# Patient Record
Sex: Female | Born: 1935 | Race: White | Hispanic: No | Smoking: Former smoker
Health system: Southern US, Community
[De-identification: ages and names within clinical notes are randomized; demographics above are authoritative.]

## PROBLEM LIST (undated history)

## (undated) DIAGNOSIS — K219 Gastro-esophageal reflux disease without esophagitis: Secondary | ICD-10-CM

## (undated) DIAGNOSIS — M1990 Unspecified osteoarthritis, unspecified site: Secondary | ICD-10-CM

## (undated) DIAGNOSIS — E119 Type 2 diabetes mellitus without complications: Secondary | ICD-10-CM

## (undated) DIAGNOSIS — Z87442 Personal history of urinary calculi: Secondary | ICD-10-CM

## (undated) DIAGNOSIS — F32A Depression, unspecified: Secondary | ICD-10-CM

## (undated) DIAGNOSIS — J449 Chronic obstructive pulmonary disease, unspecified: Secondary | ICD-10-CM

## (undated) DIAGNOSIS — I38 Endocarditis, valve unspecified: Secondary | ICD-10-CM

## (undated) DIAGNOSIS — Q159 Congenital malformation of eye, unspecified: Secondary | ICD-10-CM

## (undated) DIAGNOSIS — I471 Supraventricular tachycardia, unspecified: Secondary | ICD-10-CM

## (undated) DIAGNOSIS — E785 Hyperlipidemia, unspecified: Secondary | ICD-10-CM

## (undated) DIAGNOSIS — C801 Malignant (primary) neoplasm, unspecified: Secondary | ICD-10-CM

## (undated) DIAGNOSIS — F0390 Unspecified dementia without behavioral disturbance: Secondary | ICD-10-CM

## (undated) DIAGNOSIS — F329 Major depressive disorder, single episode, unspecified: Secondary | ICD-10-CM

## (undated) DIAGNOSIS — I10 Essential (primary) hypertension: Secondary | ICD-10-CM

## (undated) DIAGNOSIS — K649 Unspecified hemorrhoids: Secondary | ICD-10-CM

## (undated) HISTORY — PX: ABDOMINAL HYSTERECTOMY: SHX81

## (undated) HISTORY — PX: EYE SURGERY: SHX253

## (undated) HISTORY — PX: HEMORRHOID SURGERY: SHX153

---
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
## 2004-05-23 ENCOUNTER — Other Ambulatory Visit: Payer: Self-pay

## 2004-05-23 ENCOUNTER — Emergency Department: Payer: Self-pay | Admitting: Emergency Medicine

## 2004-10-04 ENCOUNTER — Ambulatory Visit: Payer: Self-pay | Admitting: Family Medicine

## 2005-07-21 ENCOUNTER — Inpatient Hospital Stay: Payer: Self-pay | Admitting: Internal Medicine

## 2005-07-22 ENCOUNTER — Other Ambulatory Visit: Payer: Self-pay

## 2005-07-23 ENCOUNTER — Other Ambulatory Visit: Payer: Self-pay

## 2005-10-09 ENCOUNTER — Ambulatory Visit: Payer: Self-pay | Admitting: Family Medicine

## 2006-07-02 HISTORY — PX: JOINT REPLACEMENT: SHX530

## 2006-10-07 ENCOUNTER — Other Ambulatory Visit: Payer: Self-pay

## 2006-10-07 ENCOUNTER — Emergency Department: Payer: Self-pay | Admitting: Emergency Medicine

## 2006-10-16 ENCOUNTER — Ambulatory Visit: Payer: Self-pay | Admitting: Family Medicine

## 2007-02-23 ENCOUNTER — Emergency Department: Payer: Self-pay | Admitting: Internal Medicine

## 2007-06-16 ENCOUNTER — Ambulatory Visit: Payer: Self-pay | Admitting: General Practice

## 2007-06-16 ENCOUNTER — Other Ambulatory Visit: Payer: Self-pay

## 2007-06-30 ENCOUNTER — Inpatient Hospital Stay: Payer: Self-pay | Admitting: General Practice

## 2007-10-23 ENCOUNTER — Ambulatory Visit: Payer: Self-pay | Admitting: Family Medicine

## 2008-02-09 ENCOUNTER — Ambulatory Visit: Payer: Self-pay | Admitting: Unknown Physician Specialty

## 2008-07-02 ENCOUNTER — Emergency Department: Payer: Self-pay | Admitting: Emergency Medicine

## 2008-11-08 ENCOUNTER — Ambulatory Visit: Payer: Self-pay | Admitting: Family Medicine

## 2009-05-03 ENCOUNTER — Ambulatory Visit: Payer: Self-pay | Admitting: Family Medicine

## 2009-11-09 ENCOUNTER — Ambulatory Visit: Payer: Self-pay | Admitting: Family Medicine

## 2010-01-07 ENCOUNTER — Emergency Department: Payer: Self-pay | Admitting: Emergency Medicine

## 2011-10-31 ENCOUNTER — Ambulatory Visit: Payer: Self-pay | Admitting: Internal Medicine

## 2012-10-22 ENCOUNTER — Ambulatory Visit: Payer: Self-pay | Admitting: Family Medicine

## 2013-02-12 ENCOUNTER — Emergency Department: Payer: Self-pay | Admitting: Emergency Medicine

## 2013-02-12 LAB — CK TOTAL AND CKMB (NOT AT ARMC)
CK, Total: 90 U/L (ref 21–215)
CK-MB: 0.5 ng/mL (ref 0.5–3.6)

## 2013-02-12 LAB — COMPREHENSIVE METABOLIC PANEL
Albumin: 3.7 g/dL (ref 3.4–5.0)
Anion Gap: 5 — ABNORMAL LOW (ref 7–16)
BUN: 8 mg/dL (ref 7–18)
Bilirubin,Total: 0.4 mg/dL (ref 0.2–1.0)
Chloride: 98 mmol/L (ref 98–107)
Co2: 29 mmol/L (ref 21–32)
Creatinine: 0.94 mg/dL (ref 0.60–1.30)
EGFR (African American): >60
EGFR (Non-African Amer.): 55 — ABNORMAL LOW
Osmolality: 263 (ref 275–301)
Potassium: 4.8 mmol/L (ref 3.5–5.1)
SGPT (ALT): 19 U/L (ref 12–78)
Sodium: 132 mmol/L — ABNORMAL LOW (ref 136–145)
Total Protein: 6.7 g/dL (ref 6.4–8.2)

## 2013-02-12 LAB — URINALYSIS, COMPLETE
Bilirubin,UR: NEGATIVE
Blood: NEGATIVE
Glucose,UR: NEGATIVE mg/dL (ref 0–75)
Protein: NEGATIVE
Squamous Epithelial: 3
WBC UR: 31 /HPF (ref 0–5)

## 2013-02-12 LAB — CBC
HCT: 35.3 % (ref 35.0–47.0)
HGB: 12.3 g/dL (ref 12.0–16.0)
MCHC: 34.9 g/dL (ref 32.0–36.0)
MCV: 89 fL (ref 80–100)
Platelet: 247 10*3/uL (ref 150–440)

## 2013-02-12 LAB — PRO B NATRIURETIC PEPTIDE: B-Type Natriuretic Peptide: 74 pg/mL (ref 0–450)

## 2013-02-12 LAB — TROPONIN I: Troponin-I: <0.02 ng/mL

## 2013-02-14 ENCOUNTER — Emergency Department: Payer: Self-pay | Admitting: Emergency Medicine

## 2013-02-14 LAB — URINALYSIS, COMPLETE
Bilirubin,UR: NEGATIVE
Blood: NEGATIVE
Glucose,UR: NEGATIVE mg/dL (ref 0–75)
Ketone: NEGATIVE
Nitrite: NEGATIVE
Ph: 5 (ref 4.5–8.0)
Protein: NEGATIVE
RBC,UR: 3 /HPF (ref 0–5)
Specific Gravity: 1.005 (ref 1.003–1.030)
Squamous Epithelial: 19
WBC UR: 17 /HPF (ref 0–5)

## 2013-02-14 LAB — URINE CULTURE

## 2013-02-15 LAB — COMPREHENSIVE METABOLIC PANEL
Alkaline Phosphatase: 71 U/L (ref 50–136)
Calcium, Total: 10.5 mg/dL — ABNORMAL HIGH (ref 8.5–10.1)
Co2: 29 mmol/L (ref 21–32)
Creatinine: 1.03 mg/dL (ref 0.60–1.30)
EGFR (African American): 57 — ABNORMAL LOW
EGFR (Non-African Amer.): 50 — ABNORMAL LOW
Glucose: 102 mg/dL — ABNORMAL HIGH (ref 65–99)
Osmolality: 262 (ref 275–301)
SGOT(AST): 24 U/L (ref 15–37)
SGPT (ALT): 19 U/L (ref 12–78)
Sodium: 131 mmol/L — ABNORMAL LOW (ref 136–145)
Total Protein: 7.3 g/dL (ref 6.4–8.2)

## 2013-02-15 LAB — CBC
HGB: 13.2 g/dL (ref 12.0–16.0)
MCHC: 34.5 g/dL (ref 32.0–36.0)
MCV: 90 fL (ref 80–100)
Platelet: 272 10*3/uL (ref 150–440)
RDW: 13.4 % (ref 11.5–14.5)
WBC: 12.1 10*3/uL — ABNORMAL HIGH (ref 3.6–11.0)

## 2013-02-15 LAB — LIPASE, BLOOD: Lipase: 133 U/L (ref 73–393)

## 2013-02-16 LAB — URINE CULTURE

## 2013-02-21 LAB — URINALYSIS, COMPLETE
Bacteria: NONE SEEN
Blood: NEGATIVE
Blood: NEGATIVE
Glucose,UR: NEGATIVE mg/dL (ref 0–75)
Hyaline Cast: 9
Leukocyte Esterase: NEGATIVE
Nitrite: NEGATIVE
Nitrite: NEGATIVE
Protein: 25
RBC,UR: 28 /HPF (ref 0–5)
RBC,UR: 12 /HPF (ref 0–5)
Specific Gravity: 1.02 (ref 1.003–1.030)
Specific Gravity: 1.015 (ref 1.003–1.030)
Squamous Epithelial: NONE SEEN
Transitional Epi: 4
WBC UR: 32 /HPF (ref 0–5)
WBC UR: 1 /HPF (ref 0–5)

## 2013-02-21 LAB — CBC
HCT: 37.8 % (ref 35.0–47.0)
HGB: 13 g/dL (ref 12.0–16.0)
MCHC: 34.5 g/dL (ref 32.0–36.0)
MCV: 89 fL (ref 80–100)
RBC: 4.23 10*6/uL (ref 3.80–5.20)
RDW: 13.5 % (ref 11.5–14.5)
WBC: 6.3 10*3/uL (ref 3.6–11.0)

## 2013-02-21 LAB — COMPREHENSIVE METABOLIC PANEL
Anion Gap: 11 (ref 7–16)
Bilirubin,Total: 0.5 mg/dL (ref 0.2–1.0)
Calcium, Total: 10.3 mg/dL — ABNORMAL HIGH (ref 8.5–10.1)
Chloride: 92 mmol/L — ABNORMAL LOW (ref 98–107)
Creatinine: 1.26 mg/dL (ref 0.60–1.30)
EGFR (African American): 45 — ABNORMAL LOW
EGFR (Non-African Amer.): 39 — ABNORMAL LOW
Glucose: 135 mg/dL — ABNORMAL HIGH (ref 65–99)
SGOT(AST): 21 U/L (ref 15–37)
Sodium: 126 mmol/L — ABNORMAL LOW (ref 136–145)
Total Protein: 7.2 g/dL (ref 6.4–8.2)

## 2013-02-21 LAB — CK TOTAL AND CKMB (NOT AT ARMC)
CK, Total: 36 U/L (ref 21–215)
CK-MB: 1 ng/mL (ref 0.5–3.6)

## 2013-02-22 ENCOUNTER — Inpatient Hospital Stay: Payer: Self-pay | Admitting: Family Medicine

## 2013-02-22 LAB — CK TOTAL AND CKMB (NOT AT ARMC)
CK, Total: 42 U/L (ref 21–215)
CK, Total: 37 U/L (ref 21–215)
CK-MB: 1.5 ng/mL (ref 0.5–3.6)
CK-MB: 1.3 ng/mL (ref 0.5–3.6)

## 2013-02-22 LAB — BASIC METABOLIC PANEL
BUN: 9 mg/dL (ref 7–18)
Chloride: 100 mmol/L (ref 98–107)
Co2: 25 mmol/L (ref 21–32)
Creatinine: 1.05 mg/dL (ref 0.60–1.30)
EGFR (African American): 56 — ABNORMAL LOW
EGFR (Non-African Amer.): 48 — ABNORMAL LOW
Osmolality: 264 (ref 275–301)

## 2013-02-22 LAB — TROPONIN I: Troponin-I: 0.09 ng/mL — ABNORMAL HIGH

## 2013-03-02 ENCOUNTER — Emergency Department: Payer: Self-pay | Admitting: Emergency Medicine

## 2013-03-02 LAB — URINALYSIS, COMPLETE
Bacteria: NONE SEEN
Blood: NEGATIVE
Ph: 5 (ref 4.5–8.0)
RBC,UR: <1 /HPF (ref 0–5)
Squamous Epithelial: NONE SEEN
WBC UR: 1 /HPF (ref 0–5)

## 2013-03-02 LAB — COMPREHENSIVE METABOLIC PANEL
Albumin: 3.5 g/dL (ref 3.4–5.0)
Alkaline Phosphatase: 101 U/L (ref 50–136)
Anion Gap: 5 — ABNORMAL LOW (ref 7–16)
BUN: 16 mg/dL (ref 7–18)
Bilirubin,Total: 0.4 mg/dL (ref 0.2–1.0)
Creatinine: 1.16 mg/dL (ref 0.60–1.30)
EGFR (Non-African Amer.): 43 — ABNORMAL LOW
SGOT(AST): 27 U/L (ref 15–37)
SGPT (ALT): 30 U/L (ref 12–78)
Sodium: 134 mmol/L — ABNORMAL LOW (ref 136–145)
Total Protein: 6.5 g/dL (ref 6.4–8.2)

## 2013-03-02 LAB — CBC
HCT: 38.7 % (ref 35.0–47.0)
HGB: 13.2 g/dL (ref 12.0–16.0)
MCHC: 34 g/dL (ref 32.0–36.0)
MCV: 90 fL (ref 80–100)
Platelet: 450 10*3/uL — ABNORMAL HIGH (ref 150–440)
RBC: 4.3 10*6/uL (ref 3.80–5.20)
WBC: 10.4 10*3/uL (ref 3.6–11.0)

## 2013-03-02 LAB — BASIC METABOLIC PANEL
Anion Gap: 6 — ABNORMAL LOW (ref 7–16)
Calcium, Total: 10.7 mg/dL — ABNORMAL HIGH (ref 8.5–10.1)
EGFR (Non-African Amer.): 40 — ABNORMAL LOW
Glucose: 111 mg/dL — ABNORMAL HIGH (ref 65–99)
Potassium: 4.5 mmol/L (ref 3.5–5.1)

## 2013-03-02 LAB — TROPONIN I: Troponin-I: <0.02 ng/mL

## 2013-03-20 ENCOUNTER — Ambulatory Visit: Payer: Self-pay | Admitting: Family Medicine

## 2013-05-04 ENCOUNTER — Ambulatory Visit: Payer: Self-pay | Admitting: Gastroenterology

## 2013-05-05 LAB — PATHOLOGY REPORT

## 2013-12-13 DIAGNOSIS — I10 Essential (primary) hypertension: Secondary | ICD-10-CM | POA: Diagnosis present

## 2014-03-17 DIAGNOSIS — N189 Chronic kidney disease, unspecified: Secondary | ICD-10-CM | POA: Diagnosis present

## 2014-08-05 ENCOUNTER — Ambulatory Visit: Payer: Self-pay | Admitting: Unknown Physician Specialty

## 2014-10-22 NOTE — Discharge Summary (Signed)
PATIENT NAME:  Erica Donovan, Erica Donovan MR#:  [id number] DATE OF BIRTH:  1925-08-24  DATE OF ADMISSION:  02/22/2013 DATE OF DISCHARGE:  02/22/2013  PRIMARY CARE PHYSICIAN:  Youlanda Roys. Lovie Macadamia, MD  Please refer to H and P dictated by Dr. Margaretmary Eddy.  In general, the patient is an 81 year old female who has history of hypertension, hyperlipidemia, diabetes. She has mitral regurgitation, hyperlipidemia, obesity, and affective disorder, paroxysmal SVT.   She comes to the ER with a history of 10 days of a urinary tract infection. Whenever she came to the ER she was complaining of chest pain and shortness of breath. At that time she was diagnosed with a UTI and sent home.   She is taking Cipro, and she started getting some vomiting and dehydration. This medication was discontinued and changed to Bactrim.   Apparently, the patient started getting a little bit more nauseated. She started to have significant vomiting, not able to keep anything down, decreased her intake per mouth, and started to get dehydrated. On admission the patient was noticed to have a low sodium of 126, and an elevated troponin of 0.08.   The patient was not having any chest pain. Was not having any shortness of breath whatsoever.   She was evaluated. At the evaluation her temperature was 99.7, pulse was 96, respirations 20. Her blood pressure was 141/51. Her lung exam was remarkable. Her urinalysis showed no nitrites or leukocyte esterase, negative for blood, and had 32 white blood cells, but after repeat an I and O catheterization she only had 1 white blood cell;  low suspicion for a urinary tract infection. The patient did not have any further nausea and vomiting when she arrived to the floor. Upon further investigation, the patient has not had a bowel movement in 1-1/2 weeks, for which we started some catharsis.   As to after an enema and recommendations for stool softeners the patient actually felt fine to go home; actually she wanted one to  go home right away as soon as she had a bowel movement. We preferred for her to stay overnight and watch her closely but  the patient stated that she wanted to go home.   We checked on her cardiac enzymes. The first one was slightly elevated at 0.08, which was likely all of the nausea, vomiting and the stress. The second was 0.12, and the third one was  0.09, for which we felt comfortable discharging her, with general  recommendations.   This elevation of troponin was a troponin leak due to all the vomiting and retching that she was doing. Her sodium improved to 132 after IV fluids. The patient was in good condition, afebrile, without any other major issues, for which she was discharged.   I spent about 45 minutes with this discharge.   MEDICATIONS AT DISCHARGE: Lipitor 20 mg at bedtime, aspirin 81 mg daily, Diovan / hydrochlorothiazide 12.5/60 once a day, cyclobenzaprine 5 mg at bedtime, lorazepam 0.5 mg every 8 hours, metformin 500 mg twice daily, multivitamins once a day, Tylenol 500 mg  2 tablets q.6 hours p.r.n. severe pain, Benadryl 25 mg 3 times daily, omeprazole 40 mg twice daily, Zofran as needed for nausea. Bactrim 800/160, 1 to 2 times a day; continue Rx until gone.     ____________________________ Inverness Sink, MD rsg:dm D: 02/23/2013 07:07:54 ET T: 02/23/2013 08:16:50 ET JOB#: [id number]  cc: Reubens Sink, MD, <Dictator> Youlanda Roys. Lovie Macadamia, MD Roselie Awkward America Brown MD ELECTRONICALLY SIGNED 02/27/2013 12:48

## 2014-10-22 NOTE — H&P (Signed)
PATIENT NAME:  Erica Donovan, Erica Donovan MR#:  [id number] DATE OF BIRTH:  September 26, 1925  DATE OF ADMISSION:  02/22/2013  PRIMARY CARE PHYSICIAN:  Dr. Juluis Pitch.  REFERRING PHYSICIAN:  Dr. Reita Cliche.  CHIEF COMPLAINT:  Intractable nausea, vomiting.   HISTORY OF PRESENT ILLNESS:  The patient is an 81 year old Caucasian female who was seen by the ER physician approximately 10 days ago when she came with chest pain and shortness of breath, but diagnosed with a urinary tract infection and was sent home with ciprofloxacin.  Following taking ciprofloxacin, the patient started developing nausea and vomiting and she was dehydrated.  She was seen by the ER physician again on Saturday, approximately one week ago, and ciprofloxacin was discontinued as the patient could not tolerate that and she was started on Bactrim and she was discharged after providing IV fluids for dehydration.  The patient's situation clinically is still declining and still nauseated and vomiting and could not keep anything down.  She is coming into the ER again today for nausea, vomiting, decreased by mouth intake and weakness.  The patient's initial urinalysis has revealed 3+ leukocyte esterase, but repeat urinalysis in two hours has revealed negative leukocyte esterase and nitrate.  Her sodium is low at 126 and troponin is slightly elevated at 0.08.  The patient denies any chest pain or shortness of breath.  Denies any abdominal pain, dizziness or loss of consciousness.  No similar complaints in the past.   PAST MEDICAL HISTORY:  Hypertension, hyperlipidemia, diabetes mellitus, history of mitral regurgitation and tricuspid regurgitation, hyperlipidemia, obesity, GERD, affective disorder, paroxysmal SVT.   PAST SURGICAL HISTORY:  Total knee replacement, left cataract surgery, hysterectomy, hemorrhoidectomy, appendectomy, kidney stones.   PSYCHOSOCIAL HISTORY:  Lives alone.  Quit smoking 30 years ago.  Denies alcohol or illicit drug usage.   FAMILY  HISTORY:  Father, diabetes and MI, died at age 58.  Mother with complication of diabetes.   HOME MEDICATIONS:  Zofran 1 tablet by mouth q. 8 hours, Tylenol 500 mg 2 tablets every six hours, omeprazole 40 mg 2 times a day, multivitamin 1 tablet once daily, metformin 500 mg 2 times a day, Lipitor 20 mg once daily, Benadryl 25 mg 3 times a day, Bactrim 1 tablet by mouth 2 times a day, aspirin 81 mg once daily.   REVIEW OF SYSTEMS: CONSTITUTIONAL:  Denies any fever, fatigue.  EYES:  Denies blurry vision, glaucoma.  EARS, NOSE, THROAT:  No epistaxis or discharge.   RESPIRATION:  No cough, COPD.  CARDIOVASCULAR:  Denies any chest pain or palpitations.  GASTROINTESTINAL:  Intractable nausea and vomiting.  Denies diarrhea.  Denies any abdominal pain.  GENITOURINARY:  No dysuria, hematuria.  GYNECOLOGIC AND BREAST:  Denies any breast mass, vaginal discharge, status post hysterectomy.  ENDOCRINE:  No polyuria, nocturia.  Denies any thyroid problems.  HEMATOLOGIC AND LYMPHATIC:  No anemia, easy bruising.  MUSCULOSKELETAL:  No joint pain in the neck, back.  Denies gout. NEUROLOGIC:  No vertigo or ataxia.  PSYCHIATRIC:  No ADD, OCD.   PHYSICAL EXAMINATION: VITAL SIGNS:  Temperature 99.7, pulse is 96, respirations 20, blood pressure 141/51, pulse ox 97%.  GENERAL APPEARANCE:  Not under acute distress.  Moderately built and nourished.  HEENT:  Normocephalic, atraumatic.  Pupils are equal, reactive to light and accommodation.  No sinus tenderness.  No scleral icterus.  No conjunctival injection.  No postnasal drip.  Dry mucous membranes.  NECK:  Supple.  No JVD.  No thyromegaly.  No lymphadenopathy.  LUNGS:  Clear to auscultation bilaterally.  No accessory muscle usage.  No anterior chest wall tenderness on palpation.  CARDIAC:  S1, S2 normal.  Positive murmur.  GASTROINTESTINAL:  Soft, obese.  Bowel sounds are positive in all four quadrants.  Nontender, nondistended.  No hepatosplenomegaly.   NEUROLOGIC:   Awake, alert, oriented x 3.  Motor and sensory are grossly intact.  Reflexes are 2+.  EXTREMITIES:  No edema.  No cyanosis.  No clubbing.  PSYCHIATRIC:  Normal mood and affect.  SKIN:  Warm to touch.  Normal turgor.  Dry in nature.  No rashes.  No lesions.  MUSCULOSKELETAL:  No joint effusion, tenderness, erythema.   LABORATORY DATA:  Initial urinalysis done at 20 hours 54 minutes has revealed yellow color, clear in appearance with nitrites negative, leukocyte esterase 3+.  Repeat urinalysis which was done on 23rd of August at 22 hours 53 minutes has revealed negative blood, no nitrite or leukocyte esterase.  CBC is normal.  White count is also normal.  CK total 36, CPK-MB 1.0, troponin 0.08.  LFTs are normal.  Glucose 135, BUN 10, creatinine 1.06, sodium 126, potassium 4.1, chloride 92, CO2 23.  GFR 39.  Calcium 10.3.  A 12 lead EKG, normal sinus rhythm, nonspecific ST-T wave changes.   ASSESSMENT AND PLAN:  An 81 year old Caucasian female presenting to the ER with a chief complaint of intractable nausea, vomiting following taking antibiotics for urinary tract infection, will be admitted with the following assessment and plan. 1.  Intractable nausea and vomiting.  We will keep her nothing by mouth.  We will provide her IV fluids and proton pump inhibitor and Zofran will be provided for nausea and vomiting.  2.  Acute cystitis.  We will give her IV levofloxacin.  3.  Elevated troponin, could be from demand ischemia.  We will cycle cardiac biomarkers and put her on acute coronary syndrome protocol.  We will provide aspirin via rectum.  If necessary, we will consult cardiology, Dr. Nehemiah Massed who is her cardiologist.  4.  Hyponatremia, probably from dehydration.  We will provide IV fluids.  5.  Diabetes mellitus.  The patient will be on sliding scale insulin.  6.  Hypertension.  Blood pressure is stable.   7.  We will provide GI and DVT prophylaxis.  8.  CODE STATUS:  SHE IS FULL CODE.  Daughter is her  medical power of attorney.    Diagnosis and plan of care was discussed in detail with the patient and her daughter at bedside.  They both verbalized understanding of the plan.   Total time spent on the admission is 45 minutes.    ____________________________ Nicholes Mango, MD ag:ea D: 02/22/2013 00:36:50 ET T: 02/22/2013 02:08:27 ET JOB#: [id number]  cc: Nicholes Mango, MD, <Dictator> Nicholes Mango MD ELECTRONICALLY SIGNED 02/24/2013 6:59

## 2015-03-18 DIAGNOSIS — E119 Type 2 diabetes mellitus without complications: Secondary | ICD-10-CM

## 2015-08-06 DIAGNOSIS — F419 Anxiety disorder, unspecified: Secondary | ICD-10-CM | POA: Insufficient documentation

## 2015-08-06 DIAGNOSIS — K219 Gastro-esophageal reflux disease without esophagitis: Secondary | ICD-10-CM | POA: Insufficient documentation

## 2015-09-29 ENCOUNTER — Ambulatory Visit
Admission: RE | Admit: 2015-09-29 | Discharge: 2015-09-29 | Disposition: A | Discharge status: Home / Self Care | Payer: Medicare Other | Source: Ambulatory Visit | Attending: Unknown Physician Specialty | Admitting: Unknown Physician Specialty

## 2015-09-29 ENCOUNTER — Other Ambulatory Visit: Payer: Self-pay | Admitting: Unknown Physician Specialty

## 2015-09-29 DIAGNOSIS — R059 Cough, unspecified: Secondary | ICD-10-CM

## 2015-09-29 DIAGNOSIS — R05 Cough: Secondary | ICD-10-CM | POA: Insufficient documentation

## 2017-02-14 ENCOUNTER — Other Ambulatory Visit: Payer: Self-pay | Admitting: Family Medicine

## 2017-02-14 DIAGNOSIS — R52 Pain, unspecified: Secondary | ICD-10-CM

## 2017-02-14 DIAGNOSIS — R102 Pelvic and perineal pain: Secondary | ICD-10-CM

## 2017-02-14 DIAGNOSIS — R229 Localized swelling, mass and lump, unspecified: Secondary | ICD-10-CM

## 2017-02-21 ENCOUNTER — Ambulatory Visit
Admission: RE | Admit: 2017-02-21 | Discharge: 2017-02-21 | Disposition: A | Discharge status: Home / Self Care | Payer: Medicare Other | Source: Ambulatory Visit | Attending: Family Medicine | Admitting: Family Medicine

## 2017-02-21 ENCOUNTER — Other Ambulatory Visit: Payer: Self-pay | Admitting: Family Medicine

## 2017-02-21 DIAGNOSIS — R229 Localized swelling, mass and lump, unspecified: Secondary | ICD-10-CM

## 2017-02-21 DIAGNOSIS — K5730 Diverticulosis of large intestine without perforation or abscess without bleeding: Secondary | ICD-10-CM | POA: Insufficient documentation

## 2017-02-21 DIAGNOSIS — R102 Pelvic and perineal pain: Secondary | ICD-10-CM

## 2017-02-21 HISTORY — DX: Type 2 diabetes mellitus without complications: E11.9

## 2017-02-21 LAB — POCT I-STAT CREATININE: Creatinine, Ser: 1.9 mg/dL — ABNORMAL HIGH (ref 0.44–1.00)

## 2017-02-21 MED ORDER — IOPAMIDOL (ISOVUE-300) INJECTION 61%: 100.0000 mL | Freq: Once | INTRAVENOUS | Status: DC | PRN

## 2017-10-21 ENCOUNTER — Other Ambulatory Visit: Payer: Self-pay | Admitting: Specialist

## 2017-10-21 DIAGNOSIS — R1310 Dysphagia, unspecified: Secondary | ICD-10-CM

## 2017-11-13 ENCOUNTER — Ambulatory Visit
Admission: RE | Admit: 2017-11-13 | Discharge: 2017-11-13 | Disposition: A | Discharge status: Home / Self Care | Payer: Medicare Other | Source: Ambulatory Visit | Attending: Specialist | Admitting: Specialist

## 2017-11-13 DIAGNOSIS — R1312 Dysphagia, oropharyngeal phase: Secondary | ICD-10-CM

## 2017-11-13 DIAGNOSIS — R1310 Dysphagia, unspecified: Secondary | ICD-10-CM | POA: Insufficient documentation

## 2017-11-13 NOTE — Therapy (Signed)
[place], [place], [address] Phone: [phone]   Fax:     Modified Barium Swallow  Patient Details  Name: YULA CROTWELL MRN: [id number] Date of Birth: [birth year] No data recorded  Encounter Date: 11/13/2017  End of Session - 11/13/17 1337    Visit Number  1    Number of Visits  1    Date for SLP Re-Evaluation  11/13/17    SLP Start Time  13    SLP Stop Time   1330    SLP Time Calculation (min)  60 min       Past Medical History:  Diagnosis Date  . Diabetes mellitus without complication ([place])       There were no vitals filed for this visit.   Subjective: Patient behavior: (alertness, ability to follow instructions, etc.): The patient is able to verbalize her swallowing history and follow directions.  Chief complaint: chronic cough, concern for micro-aspiration   Objective:  Radiological Procedure: A videoflouroscopic evaluation of oral-preparatory, reflex initiation, and pharyngeal phases of the swallow was performed; as well as a screening of the upper esophageal phase.  I. POSTURE: Upright in MBS chair  II. VIEW: Lateral  III. COMPENSATORY STRATEGIES: N/A  IV. BOLUSES ADMINISTERED:   Thin Liquid: 1 small, 4 rapid consecutive   Nectar-thick Liquid: 1 moderate   Honey-thick Liquid: DNT   Puree: 2 teaspoon presentations   Mechanical Soft: 1/4 graham cracker in applesauce  V. RESULTS OF EVALUATION: A. ORAL PREPARATORY PHASE: (The lips, tongue, and velum are observed for strength and coordination)       **Overall Severity Rating:  Within normal limits; trace/coating residue consistent with adhering to dry mucus membranes  B. SWALLOW INITIATION/REFLEX: (The reflex is normal if "triggered" by the time the bolus reached the base of the tongue)  **Overall Severity Rating: Mild; triggers while falling from the valleculae to the pyriform sinuses  C. PHARYNGEAL PHASE:  (Pharyngeal function is normal if the bolus shows rapid, smooth, and continuous transit through the pharynx and there is no pharyngeal residue after the swallow)  **Overall Severity Rating: Within normal limits;  trace/coating residue consistent with adhering to dry mucus membranes  D. LARYNGEAL PENETRATION: (Material entering into the laryngeal inlet/vestibule but not aspirated) None  E. ASPIRATION: None  F. ESOPHAGEAL PHASE: (Screening of the upper esophagus): In the cervical esophagus there is a finger-like protrusion along the posterior wall during swallow (does not impede flow of boluses) consistent with prominent cricopharyngeus.    ASSESSMENT: This 84 year old woman; with chronic cough and concern for micro-aspiration; is presenting with minimal oropharyngeal dysphagia characterized by trace/coating oropharyngeal residue and delayed pharyngeal swallow initiation.  Oral control of the bolus including oral hold, rotary mastication, and anterior to posterior transfer is within normal limits. Aspects of the pharyngeal stage of swallowing including tongue base retraction, hyolaryngeal excursion, epiglottic inversion, and duration/amplitude of UES opening are within normal limits.  There is no observed laryngeal penetration or tracheal aspiration.  In the cervical esophagus there is a finger-like protrusion along the posterior wall during swallow (does not impede flow of boluses) consistent with prominent cricopharyngeus.    PLAN/RECOMMENDATIONS:   A. Diet: Regular   B. Swallowing Precautions: No special precautions required   C. Recommended consultation to: follow up with MDs as recommended   D. Therapy recommendations: speech therapy is not indicated   E. Results and recommendations were discussed with the patient immediately following the study  and the final report routed to tthe referring MD  :   Oropharyngeal dysphagia - Plan: DG OP Swallowing Func-Medicare/Speech Path, DG OP Swallowing  Func-Medicare/Speech Path        Problem List There are no active problems to display for this patient.  Leroy Sea, MS/CCC- SLP  Lou Miner 11/13/2017, 1:38 PM  [facility] [place], [place], [address] Phone: [phone]   Fax:     Name: APHRODITE HARPENAU MRN: [id number] Date of Birth: 1925/09/14

## 2018-03-15 ENCOUNTER — Other Ambulatory Visit: Payer: Self-pay

## 2018-03-15 ENCOUNTER — Emergency Department: Payer: Medicare Other

## 2018-03-15 DIAGNOSIS — Y999 Unspecified external cause status: Secondary | ICD-10-CM | POA: Insufficient documentation

## 2018-03-15 DIAGNOSIS — Z7982 Long term (current) use of aspirin: Secondary | ICD-10-CM | POA: Diagnosis not present

## 2018-03-15 DIAGNOSIS — E119 Type 2 diabetes mellitus without complications: Secondary | ICD-10-CM | POA: Diagnosis not present

## 2018-03-15 DIAGNOSIS — Y9389 Activity, other specified: Secondary | ICD-10-CM | POA: Diagnosis not present

## 2018-03-15 DIAGNOSIS — S0990XA Unspecified injury of head, initial encounter: Secondary | ICD-10-CM | POA: Insufficient documentation

## 2018-03-15 DIAGNOSIS — Y92009 Unspecified place in unspecified non-institutional (private) residence as the place of occurrence of the external cause: Secondary | ICD-10-CM | POA: Diagnosis not present

## 2018-03-15 DIAGNOSIS — W0110XA Fall on same level from slipping, tripping and stumbling with subsequent striking against unspecified object, initial encounter: Secondary | ICD-10-CM | POA: Diagnosis not present

## 2018-03-15 NOTE — ED Triage Notes (Signed)
Reports closing blinds at home and table tipped and she fell and hit her head.  Denies loss of consciousness, takes baby aspirin.  Patient complains of head pain and neck pain.

## 2018-03-16 ENCOUNTER — Emergency Department
Admission: EM | Admit: 2018-03-16 | Discharge: 2018-03-16 | Disposition: A | Discharge status: Home / Self Care | Payer: Medicare Other | Attending: Emergency Medicine | Admitting: Emergency Medicine

## 2018-03-16 DIAGNOSIS — Y92009 Unspecified place in unspecified non-institutional (private) residence as the place of occurrence of the external cause: Secondary | ICD-10-CM

## 2018-03-16 DIAGNOSIS — S0990XA Unspecified injury of head, initial encounter: Secondary | ICD-10-CM

## 2018-03-16 DIAGNOSIS — W19XXXA Unspecified fall, initial encounter: Secondary | ICD-10-CM

## 2018-03-16 NOTE — ED Provider Notes (Signed)
[facility] Emergency Department Provider Note  ____________________________________________   First MD Initiated Contact with Patient 03/16/18 0209     (approximate)  I have reviewed the triage vital signs and the nursing notes.   HISTORY  Chief Complaint Fall and Head Injury   HPI Erica Donovan is a 84 y.o. female who comes to the emergency department after a mechanical trip and fall striking the front of her head.  She is attempting to close the blinds at her house when she slipped forward and struck her forehead.  She takes a baby aspirin a day which concerned her so she came to the emergency department for further evaluation.  Denies neck pain.  Denies loss of consciousness.  Denies double vision or blurred vision.  She had sudden onset mild to moderate headache that is slowly improving with time.  No antecedent chest pain or palpitations.    Past Medical History:  Diagnosis Date  . Diabetes mellitus without complication ([place])     There are no active problems to display for this patient.     Prior to Admission medications   Not on File    Allergies Ciprofloxacin and Penicillins  No family history on file.  Social History Social History   Tobacco Use  . Smoking status: Not on file  Substance Use Topics  . Alcohol use: Not on file  . Drug use: Not on file    Review of Systems Constitutional: No fever/chills Eyes: No visual changes. ENT: No sore throat. Cardiovascular: Denies chest pain. Respiratory: Denies shortness of breath. Gastrointestinal: No abdominal pain.  No nausea, no vomiting.   Genitourinary: Negative for dysuria. Musculoskeletal: Negative for back pain. Skin: Positive for ecchymosis Neurological: Positive for headache  ____________________________________________   PHYSICAL EXAM:  VITAL SIGNS: ED Triage Vitals  Enc Vitals Group     BP 03/15/18 2319 (!) 150/58     Pulse Rate 03/15/18 2319 79     Resp  03/15/18 2319 20     Temp 03/15/18 2319 98 F (36.7 C)     Temp Source 03/15/18 2319 Oral     SpO2 03/15/18 2319 97 %     Weight 03/15/18 2313 164 lb (74.4 kg)     Height 03/15/18 2313 5\' 5"  (1.651 m)     Head Circumference --      Peak Flow --      Pain Score 03/15/18 2320 3     Pain Loc --      Pain Edu? --      Excl. in [place]? --     Constitutional: Alert and oriented x4 pleasant cooperative speaks in full clear sentences no diaphoresis Eyes: PERRL EOMI. midrange and brisk Head: Atraumatic.  Ecchymosis to forehead Nose: No congestion/rhinnorhea. Mouth/Throat: No trismus Neck: No stridor.   Cardiovascular: Normal rate, regular rhythm. Grossly normal heart sounds.  Good peripheral circulation. Respiratory: Normal respiratory effort.  No retractions. Lungs CTAB and moving good air Gastrointestinal: Soft nontender Musculoskeletal: No lower extremity edema   Neurologic:  Normal speech and language. No gross focal neurologic deficits are appreciated. Skin:  Skin is warm, dry and intact. No rash noted. Psychiatric: Mood and affect are normal. Speech and behavior are normal.    ____________________________________________   DIFFERENTIAL includes but not limited to  Mechanical fall, syncope, intracerebral hemorrhage, cervical spine fracture ____________________________________________   LABS (all labs ordered are listed, but only abnormal results are displayed)  Labs Reviewed - No data to display  __________________________________________  EKG   ____________________________________________  RADIOLOGY  CT of the head neck reviewed by me with no acute disease ____________________________________________   PROCEDURES  Procedure(s) performed: no  Procedures  Critical Care performed: no  ____________________________________________   INITIAL IMPRESSION / ASSESSMENT AND PLAN / ED COURSE  Pertinent labs & imaging results that were available during my care of  the patient were reviewed by me and considered in my medical decision making (see chart for details).   As part of my medical decision making, I reviewed the following data within the [place] History obtained from family if available, nursing notes, old chart and ekg, as well as notes from prior ED visits.  The patient's pain is controlled head and neck CTs are unremarkable and she is neuro intact.  Family at bedside.  She is discharged home in stable condition.      ____________________________________________   FINAL CLINICAL IMPRESSION(S) / ED DIAGNOSES  Final diagnoses:  Fall in home, initial encounter  Injury of head, initial encounter      NEW MEDICATIONS STARTED DURING THIS VISIT:  There are no discharge medications for this patient.    Note:  This document was prepared using Dragon voice recognition software and may include unintentional dictation errors.     Darel Hong, MD 03/16/18 [phone]

## 2018-03-16 NOTE — Discharge Instructions (Signed)
It was a pleasure to take care of you today, and thank you for coming to our emergency department.  If you have any questions or concerns before leaving please ask the nurse to grab me and I'm more than happy to go through your aftercare instructions again.  If you were prescribed any opioid pain medication today such as Norco, Vicodin, Percocet, morphine, hydrocodone, or oxycodone please make sure you do not drive when you are taking this medication as it can alter your ability to drive safely.  If you have any concerns once you are home that you are not improving or are in fact getting worse before you can make it to your follow-up appointment, please do not hesitate to call 911 and come back for further evaluation.  Darel Hong, MD  Results for orders placed or performed during the hospital encounter of 02/21/17  I-STAT creatinine  Result Value Ref Range   Creatinine, Ser 1.90 (H) 0.44 - 1.00 mg/dL   Ct Head Wo Contrast  Result Date: 03/16/2018 CLINICAL DATA:  [place] closing blinds, struck head. No loss of consciousness. Headache and neck pain. History of diabetes. EXAM: CT HEAD WITHOUT CONTRAST CT CERVICAL SPINE WITHOUT CONTRAST TECHNIQUE: Multidetector CT imaging of the head and cervical spine was performed following the standard protocol without intravenous contrast. Multiplanar CT image reconstructions of the cervical spine were also generated. COMPARISON:  MRI head October 22, 2012 and MRI cervical spine Oct 31, 2011 FINDINGS: CT HEAD FINDINGS BRAIN: No intraparenchymal hemorrhage, mass effect nor midline shift. The ventricles and sulci are normal for age. Patchy supratentorial white matter hypodensities less than expected for patient's age, though non-specific are most compatible with chronic small vessel ischemic disease. No acute large vascular territory infarcts. No abnormal extra-axial fluid collections. Basal cisterns are patent. VASCULAR: Moderate calcific atherosclerosis of the carotid  siphons. SKULL: No skull fracture. Bilateral frontoparietal calvarial thinning is a normal variant. Small RIGHT frontal scalp hematoma without subcutaneous gas or radiopaque foreign bodies. SINUSES/ORBITS: Trace paranasal sinus mucosal thickening. Mastoid air cells are well aerated.The included ocular globes and orbital contents are non-suspicious. OTHER: Patient is edentulous. CT CERVICAL SPINE FINDINGS ALIGNMENT: Straightened cervical lordosis. Minimal grade 1 C2-3 anterolisthesis. SKULL BASE AND VERTEBRAE: Cervical vertebral bodies and posterior elements are intact. 4 mm atlantodental interval. Severe C3-4 through C5-6 disc height loss with endplate spurring compatible with degenerative discs, moderate at C2-3. Moderate upper cervical facet arthropathy. SOFT TISSUES AND SPINAL CANAL: Nonacute. DISC LEVELS: No significant osseous canal stenosis. Bilateral C3-4, LEFT C4-5, bilateral C5-6 neural foraminal narrowing. UPPER CHEST: Lung apices are clear. OTHER: None. IMPRESSION: CT HEAD: 1. No acute intracranial process. Small RIGHT frontal scalp hematoma. 2. Otherwise negative non-contrast CT HEAD for age. CT CERVICAL SPINE: 1. No acute fracture. 2. Minimal grade 1 C2-3 anterolisthesis. Mildly widened atlantodental interval, likely degenerative. 3. Severe C3-4 through C5-6 neural foraminal narrowing. Electronically Signed   By: Elon Alas M.D.   On: 03/16/2018 00:14   Ct Cervical Spine Wo Contrast  Result Date: 03/16/2018 CLINICAL DATA:  [place] closing blinds, struck head. No loss of consciousness. Headache and neck pain. History of diabetes. EXAM: CT HEAD WITHOUT CONTRAST CT CERVICAL SPINE WITHOUT CONTRAST TECHNIQUE: Multidetector CT imaging of the head and cervical spine was performed following the standard protocol without intravenous contrast. Multiplanar CT image reconstructions of the cervical spine were also generated. COMPARISON:  MRI head October 22, 2012 and MRI cervical spine Oct 31, 2011 FINDINGS: CT  HEAD FINDINGS BRAIN: No intraparenchymal  hemorrhage, mass effect nor midline shift. The ventricles and sulci are normal for age. Patchy supratentorial white matter hypodensities less than expected for patient's age, though non-specific are most compatible with chronic small vessel ischemic disease. No acute large vascular territory infarcts. No abnormal extra-axial fluid collections. Basal cisterns are patent. VASCULAR: Moderate calcific atherosclerosis of the carotid siphons. SKULL: No skull fracture. Bilateral frontoparietal calvarial thinning is a normal variant. Small RIGHT frontal scalp hematoma without subcutaneous gas or radiopaque foreign bodies. SINUSES/ORBITS: Trace paranasal sinus mucosal thickening. Mastoid air cells are well aerated.The included ocular globes and orbital contents are non-suspicious. OTHER: Patient is edentulous. CT CERVICAL SPINE FINDINGS ALIGNMENT: Straightened cervical lordosis. Minimal grade 1 C2-3 anterolisthesis. SKULL BASE AND VERTEBRAE: Cervical vertebral bodies and posterior elements are intact. 4 mm atlantodental interval. Severe C3-4 through C5-6 disc height loss with endplate spurring compatible with degenerative discs, moderate at C2-3. Moderate upper cervical facet arthropathy. SOFT TISSUES AND SPINAL CANAL: Nonacute. DISC LEVELS: No significant osseous canal stenosis. Bilateral C3-4, LEFT C4-5, bilateral C5-6 neural foraminal narrowing. UPPER CHEST: Lung apices are clear. OTHER: None. IMPRESSION: CT HEAD: 1. No acute intracranial process. Small RIGHT frontal scalp hematoma. 2. Otherwise negative non-contrast CT HEAD for age. CT CERVICAL SPINE: 1. No acute fracture. 2. Minimal grade 1 C2-3 anterolisthesis. Mildly widened atlantodental interval, likely degenerative. 3. Severe C3-4 through C5-6 neural foraminal narrowing. Electronically Signed   By: Elon Alas M.D.   On: 03/16/2018 00:14

## 2018-10-29 ENCOUNTER — Other Ambulatory Visit: Payer: Self-pay | Admitting: Orthopedic Surgery

## 2018-10-29 ENCOUNTER — Ambulatory Visit
Admission: RE | Admit: 2018-10-29 | Discharge: 2018-10-29 | Disposition: A | Discharge status: Home / Self Care | Payer: Medicare Other | Source: Ambulatory Visit | Attending: Orthopedic Surgery | Admitting: Orthopedic Surgery

## 2018-10-29 ENCOUNTER — Other Ambulatory Visit (HOSPITAL_COMMUNITY): Payer: Self-pay | Admitting: Orthopedic Surgery

## 2018-10-29 ENCOUNTER — Other Ambulatory Visit: Payer: Self-pay

## 2018-10-29 DIAGNOSIS — S52611D Displaced fracture of right ulna styloid process, subsequent encounter for closed fracture with routine healing: Secondary | ICD-10-CM

## 2018-11-05 ENCOUNTER — Encounter
Admission: RE | Admit: 2018-11-05 | Discharge: 2018-11-05 | Disposition: A | Discharge status: Home / Self Care | Payer: Medicare Other | Source: Ambulatory Visit | Attending: Orthopedic Surgery | Admitting: Orthopedic Surgery

## 2018-11-05 ENCOUNTER — Other Ambulatory Visit: Payer: Self-pay

## 2018-11-05 DIAGNOSIS — Z96652 Presence of left artificial knee joint: Secondary | ICD-10-CM | POA: Diagnosis not present

## 2018-11-05 DIAGNOSIS — X58XXXA Exposure to other specified factors, initial encounter: Secondary | ICD-10-CM | POA: Diagnosis not present

## 2018-11-05 DIAGNOSIS — G4730 Sleep apnea, unspecified: Secondary | ICD-10-CM | POA: Diagnosis not present

## 2018-11-05 DIAGNOSIS — F0390 Unspecified dementia without behavioral disturbance: Secondary | ICD-10-CM | POA: Diagnosis not present

## 2018-11-05 DIAGNOSIS — S52501A Unspecified fracture of the lower end of right radius, initial encounter for closed fracture: Secondary | ICD-10-CM | POA: Diagnosis not present

## 2018-11-05 DIAGNOSIS — N189 Chronic kidney disease, unspecified: Secondary | ICD-10-CM | POA: Diagnosis not present

## 2018-11-05 DIAGNOSIS — S52511A Displaced fracture of right radial styloid process, initial encounter for closed fracture: Secondary | ICD-10-CM | POA: Diagnosis present

## 2018-11-05 DIAGNOSIS — Z87891 Personal history of nicotine dependence: Secondary | ICD-10-CM | POA: Diagnosis not present

## 2018-11-05 DIAGNOSIS — J449 Chronic obstructive pulmonary disease, unspecified: Secondary | ICD-10-CM | POA: Diagnosis not present

## 2018-11-05 DIAGNOSIS — Z79899 Other long term (current) drug therapy: Secondary | ICD-10-CM | POA: Diagnosis not present

## 2018-11-05 DIAGNOSIS — I129 Hypertensive chronic kidney disease with stage 1 through stage 4 chronic kidney disease, or unspecified chronic kidney disease: Secondary | ICD-10-CM | POA: Diagnosis not present

## 2018-11-05 DIAGNOSIS — E1122 Type 2 diabetes mellitus with diabetic chronic kidney disease: Secondary | ICD-10-CM | POA: Diagnosis not present

## 2018-11-05 DIAGNOSIS — Z85828 Personal history of other malignant neoplasm of skin: Secondary | ICD-10-CM | POA: Diagnosis not present

## 2018-11-05 DIAGNOSIS — F329 Major depressive disorder, single episode, unspecified: Secondary | ICD-10-CM | POA: Diagnosis not present

## 2018-11-05 DIAGNOSIS — I10 Essential (primary) hypertension: Secondary | ICD-10-CM | POA: Insufficient documentation

## 2018-11-05 DIAGNOSIS — Z01818 Encounter for other preprocedural examination: Secondary | ICD-10-CM | POA: Insufficient documentation

## 2018-11-05 HISTORY — DX: Malignant (primary) neoplasm, unspecified: C80.1

## 2018-11-05 HISTORY — DX: Essential (primary) hypertension: I10

## 2018-11-05 HISTORY — DX: Chronic obstructive pulmonary disease, unspecified: J44.9

## 2018-11-05 HISTORY — DX: Congenital malformation of eye, unspecified: Q15.9

## 2018-11-05 HISTORY — DX: Endocarditis, valve unspecified: I38

## 2018-11-05 HISTORY — DX: Supraventricular tachycardia, unspecified: I47.10

## 2018-11-05 HISTORY — DX: Unspecified osteoarthritis, unspecified site: M19.90

## 2018-11-05 HISTORY — DX: Unspecified dementia, unspecified severity, without behavioral disturbance, psychotic disturbance, mood disturbance, and anxiety: F03.90

## 2018-11-05 HISTORY — DX: Depression, unspecified: F32.A

## 2018-11-05 HISTORY — DX: Supraventricular tachycardia: I47.1

## 2018-11-05 HISTORY — DX: Gastro-esophageal reflux disease without esophagitis: K21.9

## 2018-11-05 HISTORY — DX: Unspecified hemorrhoids: K64.9

## 2018-11-05 HISTORY — DX: Major depressive disorder, single episode, unspecified: F32.9

## 2018-11-05 HISTORY — DX: Personal history of urinary calculi: Z87.442

## 2018-11-05 HISTORY — DX: Hyperlipidemia, unspecified: E78.5

## 2018-11-05 LAB — POTASSIUM: Potassium: 4.1 mmol/L (ref 3.5–5.1)

## 2018-11-05 NOTE — Patient Instructions (Addendum)
Your procedure is scheduled on: 11-05-18 THURSDAY Report to Same Day Surgery 2nd floor medical mall [facility] Entrance-take elevator on left to 2nd floor.  Check in with surgery information desk.) To find out your arrival time please call [phone] between 1PM - 3PM on 11-04-18 [facility]  Remember: Instructions that are not followed completely may result in serious medical risk, up to and including death, or upon the discretion of your surgeon and anesthesiologist your surgery may need to be rescheduled.    _x___ 1. Do not eat food after midnight the night before your procedure. NO GUM OR CANDY AFTER MIDNIGHT. You may drink clear liquids up to 2 hours before you are scheduled to arrive at the hospital for your procedure.  Do not drink clear liquids within 2 hours of your scheduled arrival to the hospital.  Clear liquids include  --Water or Apple juice without pulp  --Clear carbohydrate beverage such as ClearFast or Gatorade  --Black Coffee or Clear Tea (No milk, no creamers, do not add anything to the coffee or Tea   ____Ensure clear carbohydrate drink on the way to the hospital for bariatric patients  ____Ensure clear carbohydrate drink 3 hours before surgery for Dr Dwyane Luo patients if physician instructed.    __x__ 2. No Alcohol for 24 hours before or after surgery.   __x__3. No Smoking or e-cigarettes for 24 prior to surgery.  Do not use any chewable tobacco products for at least 6 hour prior to surgery   ____  4. Bring all medications with you on the day of surgery if instructed.    __x__ 5. Notify your doctor if there is any change in your medical condition     (cold, fever, infections).    x___6. On the morning of surgery brush your teeth with toothpaste and water.  You may rinse your mouth with mouth wash if you wish.  Do not swallow any toothpaste or mouthwash.   Do not wear jewelry, make-up, hairpins, clips or nail polish.  Do not wear lotions, powders, or perfumes.  You may wear deodorant.  Do not shave 48 hours prior to surgery. Men may shave face and neck.  Do not bring valuables to the hospital.    [facility] is not responsible for any belongings or valuables.               Contacts, dentures or bridgework may not be worn into surgery.  Leave your suitcase in the car. After surgery it may be brought to your room.  For patients admitted to the hospital, discharge time is determined by your treatment team.  _  Patients discharged the day of surgery will not be allowed to drive home.  You will need someone to drive you home and stay with you the night of your procedure.    Please read over the following fact sheets that you were given:   [facility] Preparing for Surgery   _x___ TAKE THE FOLLOWING MEDICATION THE MORNING OF SURGERY WITH A SMALL SIP OF WATER. These include:  1. CLARITIN (LORATADINE)  2. LEXAPRO (ESCITALOPRAM)  3. PRILOSEC (OMEPRAZOLE)  4. YOU MAY TAKE LORAZEPAM (ATIVAN) IF NEEDED AM OF SURGERY   5.  6.  ____Fleets enema or Magnesium Citrate as directed.   _x___ Use CHG Soap or sage wipes as directed on instruction sheet   ____ Use inhalers on the day of surgery and bring to hospital day of surgery  ____ Stop Metformin and Janumet 2 days prior to  surgery.    ____ Take 1/2 of usual insulin dose the night before surgery and none on the morning surgery.   _x___ Follow recommendations from Cardiologist, Pulmonologist or PCP regarding stopping Aspirin, Coumadin, Plavix ,Eliquis, Effient, or Pradaxa, and Pletal-STOP ASPIRIN NOW  X____Stop Anti-inflammatories such as Advil, Aleve, Ibuprofen, Motrin, Naproxen,MELOXICAM, Naprosyn, Goodies powders or aspirin products NOW-OK to take Tylenol   _x___ Stop supplements until after surgery-STOP FISH OIL NOW-MAY RESUME AFTER SURGERY   ____ Bring C-Pap to the hospital.

## 2018-11-06 ENCOUNTER — Ambulatory Visit: Payer: Medicare Other | Admitting: Anesthesiology

## 2018-11-06 ENCOUNTER — Ambulatory Visit: Payer: Medicare Other

## 2018-11-06 ENCOUNTER — Ambulatory Visit
Admission: RE | Admit: 2018-11-06 | Discharge: 2018-11-06 | Disposition: A | Discharge status: Home / Self Care | Payer: Medicare Other | Attending: Orthopedic Surgery | Admitting: Orthopedic Surgery

## 2018-11-06 ENCOUNTER — Encounter
Admission: RE | Disposition: A | Discharge status: Home / Self Care | Payer: Self-pay | Source: Home / Self Care | Attending: Orthopedic Surgery

## 2018-11-06 ENCOUNTER — Other Ambulatory Visit: Payer: Self-pay

## 2018-11-06 DIAGNOSIS — Z85828 Personal history of other malignant neoplasm of skin: Secondary | ICD-10-CM | POA: Insufficient documentation

## 2018-11-06 DIAGNOSIS — Z9889 Other specified postprocedural states: Secondary | ICD-10-CM

## 2018-11-06 DIAGNOSIS — N189 Chronic kidney disease, unspecified: Secondary | ICD-10-CM | POA: Insufficient documentation

## 2018-11-06 DIAGNOSIS — G4730 Sleep apnea, unspecified: Secondary | ICD-10-CM | POA: Insufficient documentation

## 2018-11-06 DIAGNOSIS — Z87891 Personal history of nicotine dependence: Secondary | ICD-10-CM | POA: Insufficient documentation

## 2018-11-06 DIAGNOSIS — X58XXXA Exposure to other specified factors, initial encounter: Secondary | ICD-10-CM | POA: Insufficient documentation

## 2018-11-06 DIAGNOSIS — Z96652 Presence of left artificial knee joint: Secondary | ICD-10-CM | POA: Insufficient documentation

## 2018-11-06 DIAGNOSIS — E1122 Type 2 diabetes mellitus with diabetic chronic kidney disease: Secondary | ICD-10-CM | POA: Insufficient documentation

## 2018-11-06 DIAGNOSIS — S52501A Unspecified fracture of the lower end of right radius, initial encounter for closed fracture: Secondary | ICD-10-CM | POA: Insufficient documentation

## 2018-11-06 DIAGNOSIS — Z8781 Personal history of (healed) traumatic fracture: Secondary | ICD-10-CM

## 2018-11-06 DIAGNOSIS — F0390 Unspecified dementia without behavioral disturbance: Secondary | ICD-10-CM | POA: Insufficient documentation

## 2018-11-06 DIAGNOSIS — S52511A Displaced fracture of right radial styloid process, initial encounter for closed fracture: Secondary | ICD-10-CM | POA: Diagnosis not present

## 2018-11-06 DIAGNOSIS — Z79899 Other long term (current) drug therapy: Secondary | ICD-10-CM | POA: Insufficient documentation

## 2018-11-06 DIAGNOSIS — I129 Hypertensive chronic kidney disease with stage 1 through stage 4 chronic kidney disease, or unspecified chronic kidney disease: Secondary | ICD-10-CM | POA: Insufficient documentation

## 2018-11-06 DIAGNOSIS — F329 Major depressive disorder, single episode, unspecified: Secondary | ICD-10-CM | POA: Insufficient documentation

## 2018-11-06 DIAGNOSIS — J449 Chronic obstructive pulmonary disease, unspecified: Secondary | ICD-10-CM | POA: Insufficient documentation

## 2018-11-06 HISTORY — PX: ORIF WRIST FRACTURE: SHX2133

## 2018-11-06 LAB — GLUCOSE, CAPILLARY
Glucose-Capillary: 104 mg/dL — ABNORMAL HIGH (ref 70–99)
Glucose-Capillary: 121 mg/dL — ABNORMAL HIGH (ref 70–99)

## 2018-11-06 SURGERY — OPEN REDUCTION INTERNAL FIXATION (ORIF) WRIST FRACTURE
Anesthesia: General | Site: Wrist | Laterality: Right

## 2018-11-06 MED ORDER — OXYCODONE HCL 5 MG/5ML PO SOLN: 5.0000 mg | Freq: Once | ORAL | Status: DC | PRN

## 2018-11-06 MED ORDER — DEXAMETHASONE SODIUM PHOSPHATE 4 MG/ML IJ SOLN
INTRAMUSCULAR | Status: AC
  Filled 2018-11-06: qty 1

## 2018-11-06 MED ORDER — HYDROCODONE-ACETAMINOPHEN 5-325 MG PO TABS: 1.0000 | ORAL_TABLET | Freq: Four times a day (QID) | ORAL | 0 refills | Status: DC | PRN

## 2018-11-06 MED ORDER — PHENYLEPHRINE HCL (PRESSORS) 10 MG/ML IV SOLN
INTRAVENOUS | Status: DC | PRN
  Administered 2018-11-06: 100 ug via INTRAVENOUS

## 2018-11-06 MED ORDER — ONDANSETRON HCL 4 MG/2ML IJ SOLN
INTRAMUSCULAR | Status: AC
  Filled 2018-11-06: qty 2

## 2018-11-06 MED ORDER — CLINDAMYCIN PHOSPHATE 900 MG/50ML IV SOLN
INTRAVENOUS | Status: AC
  Filled 2018-11-06: qty 50

## 2018-11-06 MED ORDER — PROPOFOL 10 MG/ML IV BOLUS
INTRAVENOUS | Status: DC | PRN
  Administered 2018-11-06: 100 mg via INTRAVENOUS

## 2018-11-06 MED ORDER — KETAMINE HCL 50 MG/ML IJ SOLN
INTRAMUSCULAR | Status: DC | PRN
  Administered 2018-11-06: 25 mg via INTRAMUSCULAR

## 2018-11-06 MED ORDER — DEXAMETHASONE SODIUM PHOSPHATE 10 MG/ML IJ SOLN
INTRAMUSCULAR | Status: DC | PRN
  Administered 2018-11-06: 4 mg via INTRAVENOUS

## 2018-11-06 MED ORDER — DEXMEDETOMIDINE HCL IN NACL 200 MCG/50ML IV SOLN
INTRAVENOUS | Status: DC | PRN
  Administered 2018-11-06: 12 ug via INTRAVENOUS

## 2018-11-06 MED ORDER — OXYCODONE HCL 5 MG PO TABS: 5.0000 mg | ORAL_TABLET | Freq: Once | ORAL | Status: DC | PRN

## 2018-11-06 MED ORDER — CLINDAMYCIN PHOSPHATE 900 MG/50ML IV SOLN
900.0000 mg | Freq: Once | INTRAVENOUS | Status: AC
  Administered 2018-11-06: 900 mg via INTRAVENOUS

## 2018-11-06 MED ORDER — FENTANYL CITRATE (PF) 100 MCG/2ML IJ SOLN
INTRAMUSCULAR | Status: DC | PRN
  Administered 2018-11-06: 50 ug via INTRAVENOUS
  Administered 2018-11-06 (×2): 25 ug via INTRAVENOUS

## 2018-11-06 MED ORDER — SODIUM CHLORIDE 0.9 % IV SOLN
INTRAVENOUS | Status: DC
  Administered 2018-11-06: 13:00:00 via INTRAVENOUS

## 2018-11-06 MED ORDER — PROPOFOL 10 MG/ML IV BOLUS
INTRAVENOUS | Status: AC
  Filled 2018-11-06: qty 20

## 2018-11-06 MED ORDER — FENTANYL CITRATE (PF) 100 MCG/2ML IJ SOLN: 25.0000 ug | INTRAMUSCULAR | Status: DC | PRN

## 2018-11-06 MED ORDER — EPHEDRINE SULFATE 50 MG/ML IJ SOLN
INTRAMUSCULAR | Status: DC | PRN
  Administered 2018-11-06 (×2): 10 mg via INTRAVENOUS

## 2018-11-06 MED ORDER — ONDANSETRON HCL 4 MG/2ML IJ SOLN
INTRAMUSCULAR | Status: DC | PRN
  Administered 2018-11-06: 4 mg via INTRAVENOUS

## 2018-11-06 SURGICAL SUPPLY — 53 items
BANDAGE ACE 4X5 VEL STRL LF (GAUZE/BANDAGES/DRESSINGS) ×2 IMPLANT
BANDAGE ELASTIC 4 LF NS (GAUZE/BANDAGES/DRESSINGS) ×2 IMPLANT
BIT DRILL 2.2 SS TIBIAL (BIT) ×1 IMPLANT
CANISTER SUCT 1200ML W/VALVE (MISCELLANEOUS) ×2 IMPLANT
CHLORAPREP W/TINT 26 (MISCELLANEOUS) ×2 IMPLANT
COVER WAND RF STERILE (DRAPES) ×2 IMPLANT
CUFF TOURN SGL QUICK 18X4 (TOURNIQUET CUFF) ×1 IMPLANT
DRAPE FLUOR MINI C-ARM 54X84 (DRAPES) ×2 IMPLANT
ELECT CAUTERY BLADE 6.4 (BLADE) ×2 IMPLANT
ELECT REM PT RETURN 9FT ADLT (ELECTROSURGICAL) ×2
ELECTRODE REM PT RTRN 9FT ADLT (ELECTROSURGICAL) ×1 IMPLANT
GAUZE SPONGE 4X4 12PLY STRL (GAUZE/BANDAGES/DRESSINGS) ×2 IMPLANT
GAUZE XEROFORM 1X8 LF (GAUZE/BANDAGES/DRESSINGS) ×2 IMPLANT
GLOVE SURG SYN 9.0  PF PI (GLOVE) ×3
GLOVE SURG SYN 9.0 PF PI (GLOVE) ×1 IMPLANT
GOWN SRG 2XL LVL 4 RGLN SLV (GOWNS) ×1 IMPLANT
GOWN STRL NON-REIN 2XL LVL4 (GOWNS) ×1
GOWN STRL REUS W/ TWL LRG LVL3 (GOWN DISPOSABLE) ×1 IMPLANT
GOWN STRL REUS W/TWL LRG LVL3 (GOWN DISPOSABLE) ×1
K-WIRE 1.6 (WIRE) ×1
K-WIRE FX5X1.6XNS BN SS (WIRE) ×1
KIT TURNOVER KIT A (KITS) ×2 IMPLANT
KWIRE FX5X1.6XNS BN SS (WIRE) IMPLANT
NDL FILTER BLUNT 18X1 1/2 (NEEDLE) ×1 IMPLANT
NEEDLE FILTER BLUNT 18X 1/2SAF (NEEDLE) ×1
NEEDLE FILTER BLUNT 18X1 1/2 (NEEDLE) ×1 IMPLANT
NS IRRIG 500ML POUR BTL (IV SOLUTION) ×2 IMPLANT
PACK EXTREMITY ARMC (MISCELLANEOUS) ×2 IMPLANT
PAD CAST CTTN 4X4 STRL (SOFTGOODS) ×1 IMPLANT
PADDING CAST COTTON 4X4 STRL (SOFTGOODS) ×1
PLATE DVR DORSAL L CROSSLOCK (Plate) ×1 IMPLANT
PLATE RT DORSAL (Plate) ×1 IMPLANT
SCALPEL PROTECTED #15 DISP (BLADE) ×4 IMPLANT
SCREW  LP NL 2.7X15MM (Screw) ×1 IMPLANT
SCREW LOCK 10X2.7X3 LD THRD (Screw) IMPLANT
SCREW LOCK 12X2.7X 3 LD (Screw) IMPLANT
SCREW LOCK 16X2.7X 3 LD TPR (Screw) IMPLANT
SCREW LOCK 18X2.7X 3 LD TPR (Screw) IMPLANT
SCREW LOCK 20X2.7X 3 LD TPR (Screw) IMPLANT
SCREW LOCKING 2.7X10MM (Screw) ×1 IMPLANT
SCREW LOCKING 2.7X12MM (Screw) ×4 IMPLANT
SCREW LOCKING 2.7X16 (Screw) ×1 IMPLANT
SCREW LOCKING 2.7X18 (Screw) ×2 IMPLANT
SCREW LOCKING 2.7X20MM (Screw) ×1 IMPLANT
SCREW LOCKING 2.7X8MM (Screw) ×2 IMPLANT
SCREW LP NL 2.7X15MM (Screw) IMPLANT
SCREW NONLOCK 2.7X20MM (Screw) ×1 IMPLANT
SPLINT CAST 1 STEP 3X12 (MISCELLANEOUS) ×2 IMPLANT
SUT ETHILON 4-0 (SUTURE) ×1
SUT ETHILON 4-0 FS2 18XMFL BLK (SUTURE) ×1
SUT VICRYL 3-0 27IN (SUTURE) ×2 IMPLANT
SUTURE ETHLN 4-0 FS2 18XMF BLK (SUTURE) ×1 IMPLANT
SYR 3ML LL SCALE MARK (SYRINGE) ×2 IMPLANT

## 2018-11-06 NOTE — Anesthesia Postprocedure Evaluation (Signed)
Anesthesia Post Note  Patient: Erica Donovan  Procedure(s) Performed: OPEN REDUCTION INTERNAL FIXATION (ORIF) WRIST FRACTURE RIGHT - DIABETIC, SLEEP APNEA (Right Wrist)  Patient location during evaluation: PACU Anesthesia Type: General Level of consciousness: awake and alert Pain management: pain level controlled Vital Signs Assessment: post-procedure vital signs reviewed and stable Respiratory status: spontaneous breathing, nonlabored ventilation and respiratory function stable Cardiovascular status: blood pressure returned to baseline and stable Postop Assessment: no signs of nausea or vomiting Anesthetic complications: no     Last Vitals:  Vitals:   11/06/18 1519 11/06/18 1528  BP: (!) 135/51 (!) 124/55  Pulse: 95 96  Resp: 16 17  Temp:    SpO2: 100% 100%    Last Pain:  Vitals:   11/06/18 1528  TempSrc:   PainSc: 0-No pain                 Sandeep Delagarza

## 2018-11-06 NOTE — H&P (Signed)
Reviewed paper H+P, will be scanned into chart. No changes noted. CT showed dorsal displacement needing correction

## 2018-11-06 NOTE — Anesthesia Preprocedure Evaluation (Signed)
Anesthesia Evaluation  Patient identified by MRN, date of birth, ID band Patient awake    Reviewed: Allergy & Precautions, NPO status , Patient's Chart, lab work & pertinent test results  History of Anesthesia Complications Negative for: history of anesthetic complications  Airway Mallampati: II  TM Distance: >3 FB Neck ROM: Full    Dental  (+) Edentulous Upper, Edentulous Lower   Pulmonary neg sleep apnea, COPD, former smoker,    breath sounds clear to auscultation- rhonchi (-) wheezing      Cardiovascular hypertension, Pt. on medications (-) CAD, (-) Past MI, (-) Cardiac Stents and (-) CABG  Rhythm:Regular Rate:Normal - Systolic murmurs and - Diastolic murmurs    Neuro/Psych neg Seizures PSYCHIATRIC DISORDERS Depression Dementia negative neurological ROS     GI/Hepatic Neg liver ROS, GERD  ,  Endo/Other  diabetes (diet controlled)  Renal/GU negative Renal ROS     Musculoskeletal  (+) Arthritis ,   Abdominal (+) - obese,   Peds  Hematology negative hematology ROS (+)   Anesthesia Other Findings Past Medical History: No date: Arthritis No date: Cancer (HCC)     Comment:  skin cancer No date: COPD (chronic obstructive pulmonary disease) (HCC)     Comment:  mild- no inhalers No date: Dementia (HCC) No date: Depression No date: Diabetes mellitus without complication (HCC) No date: Eye abnormality     Comment:  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-[facility]               center keeps check on this No date: GERD (gastroesophageal reflux disease) No date: Hemorrhoids No date: History of kidney stones No date: Hyperlipidemia No date: Hypertension No date: Leaky heart valve No date: Paroxysmal SVT (supraventricular tachycardia) (HCC)   Reproductive/Obstetrics                             Anesthesia Physical Anesthesia Plan  ASA: III  Anesthesia Plan: General   Post-op Pain  Management:    Induction: Intravenous  PONV Risk Score and Plan: 2 and Ondansetron and Dexamethasone  Airway Management Planned: LMA  Additional Equipment:   Intra-op Plan:   Post-operative Plan:   Informed Consent: I have reviewed the patients History and Physical, chart, labs and discussed the procedure including the risks, benefits and alternatives for the proposed anesthesia with the patient or authorized representative who has indicated his/her understanding and acceptance.     Dental advisory given  Plan Discussed with: CRNA and Anesthesiologist  Anesthesia Plan Comments:         Anesthesia Quick Evaluation

## 2018-11-06 NOTE — Anesthesia Post-op Follow-up Note (Signed)
Anesthesia QCDR form completed.        

## 2018-11-06 NOTE — Discharge Instructions (Addendum)
Keep arm elevated is much as possible.  Work on finger range of motion.  Try not to put weight on the arm as much as possible.  Pain medicine as directed.  AMBULATORY SURGERY  DISCHARGE INSTRUCTIONS   1) The drugs that you were given will stay in your system until tomorrow so for the next 24 hours you should not:  A) Drive an automobile B) Make any legal decisions C) Drink any alcoholic beverage   2) You may resume regular meals tomorrow.  Today it is better to start with liquids and gradually work up to solid foods.  You may eat anything you prefer, but it is better to start with liquids, then soup and crackers, and gradually work up to solid foods.   3) Please notify your doctor immediately if you have any unusual bleeding, trouble breathing, redness and pain at the surgery site, drainage, fever, or pain not relieved by medication.    4) Additional Instructions:    Please contact your physician with any problems or Same Day Surgery at [phone], Monday through Friday 6 am to 4 pm, or [place] at [facility] number at [phone].#5

## 2018-11-06 NOTE — Anesthesia Procedure Notes (Signed)
Procedure Name: LMA Insertion Performed by: Negar Sieler, CRNA Pre-anesthesia Checklist: Patient identified, Patient being monitored, Timeout performed, Emergency Drugs available and Suction available Patient Re-evaluated:Patient Re-evaluated prior to induction Oxygen Delivery Method: Circle system utilized Preoxygenation: Pre-oxygenation with 100% oxygen Induction Type: IV induction Ventilation: Mask ventilation without difficulty LMA: LMA inserted LMA Size: 3.5 Tube type: Oral Number of attempts: 1 Placement Confirmation: positive ETCO2 and breath sounds checked- equal and bilateral Tube secured with: Tape Dental Injury: Teeth and Oropharynx as per pre-operative assessment        

## 2018-11-06 NOTE — Op Note (Signed)
11/06/2018  3:14 PM  PATIENT:  Erica Donovan  85 y.o. female  PRE-OPERATIVE DIAGNOSIS:  CLOSED DIPLACED FRACTURE OF radial styloid and distal radius   POST-OPERATIVE DIAGNOSIS: Same  PROCEDURE:  Procedure(s): OPEN REDUCTION INTERNAL FIXATION (ORIF) WRIST FRACTURE RIGHT - DIABETIC, SLEEP APNEA (Right)  SURGEON: Laurene Footman, MD  ASSISTANTS: None  ANESTHESIA:   general  EBL:  Total I/O In: 400 [I.V.:400] Out: -   BLOOD ADMINISTERED:none  DRAINS: none   LOCAL MEDICATIONS USED:  NONE  SPECIMEN:  No Specimen  DISPOSITION OF SPECIMEN:  N/A  COUNTS:  YES  TOURNIQUET:   47 minutes at 250 mmHg  IMPLANTS: [employer] hand innovations cross-link system dorsal plate x2 with multiple locking screws  DICTATION: .Dragon Dictation patient was brought to the operating room and after adequate general anesthesia was obtained the right arm was prepped and draped in the usual sterile fashion.  With a tourniquet applied the upper arm.  After appropriate patient identification and timeout procedures were completed, tourniquet was raised.  A dorsal approach was utilized with incision down through the skin and subcutaneous tissue.  The stent extensor retinaculum was identified and the EPL released and found to have quite a bit of fraying consistent with potential subsequent rupture.  Was taken out of the bed around Lister's tubercle and the tubercle smooth.  The adjacent extensor tendons were then elevated medially and laterally along the retinaculum to expose the distal radius on the dorsum.  With the use of an osteotome the ulnar side of the dorsal radius could be elevated distally and get anatomic reduction.  A dorsal plate was then used using the CrossFit system with multiple locking screws and one nonlocking screw to hold the plate down this gave a very good dorsal buttress with essentially anatomic fixation of the ulnar-sided fragment.  Attention was then turned to the radial side with the radial  styloid fragment being dorsally displaced allowing for subluxation and near dislocation of the scaphoid from the wrist joint.  The brachioradialis was released from this dorsal fragment to allow for mobilization and after this is done the fracture could be brought into a reduced position however the fracture was predominantly dorsal and not radial and so the standard radial side plate really would not have given appropriate fixation so the dorsal plate for the left wrist was applied to this fragment and this gave excellent buttress support with first a K wire holding the styloid fragment in appropriate position then applying the buttress plate and using the nonlocking screw first pull the plate down to the bone the locking screw holes were all filled as it was on the underside drilling measuring and placing the locking screw.  After all screws were placed traction was released there had been traction applied to the start of the case to aid in reduction in the fracture the fracture appeared stable with no dorsal subluxation of the wrist with provocative maneuvers the wounds were thoroughly irrigated and the wound then closed with 3-0 Vicryl subcutaneously along with 4-0 nylon for the skin in a simple interrupted fashion followed by Xeroform 4 x 4's web roll and volar splint followed by an Ace wrap tourniquet was let down at the close of the case prior to wound closure and there is minimal bleeding mini C arm views during the procedure showed good position of the hardware and fracture reduction.  PLAN OF CARE: Discharge to home after PACU  PATIENT DISPOSITION:  PACU - hemodynamically stable.

## 2018-11-06 NOTE — Transfer of Care (Signed)
Immediate Anesthesia Transfer of Care Note  Patient: Erica Donovan  Procedure(s) Performed: OPEN REDUCTION INTERNAL FIXATION (ORIF) WRIST FRACTURE RIGHT - DIABETIC, SLEEP APNEA (Right Wrist)  Patient Location: PACU  Anesthesia Type:General  Level of Consciousness: sedated  Airway & Oxygen Therapy: Patient Spontanous Breathing and Patient connected to face mask oxygen  Post-op Assessment: Report given to RN and Post -op Vital signs reviewed and stable  Post vital signs: Reviewed  Last Vitals:  Vitals Value Taken Time  BP 146/64 11/06/2018  3:08 PM  Temp    Pulse 92 11/06/2018  3:10 PM  Resp 15 11/06/2018  3:10 PM  SpO2 100 % 11/06/2018  3:10 PM  Vitals shown include unvalidated device data.  Last Pain:  Vitals:   11/06/18 1229  TempSrc: Temporal  PainSc: 4          Complications: No apparent anesthesia complications

## 2018-11-07 ENCOUNTER — Encounter: Payer: Self-pay | Admitting: Orthopedic Surgery

## 2019-03-02 ENCOUNTER — Encounter: Payer: Self-pay | Admitting: Emergency Medicine

## 2019-03-02 ENCOUNTER — Emergency Department: Payer: Medicare Other

## 2019-03-02 ENCOUNTER — Inpatient Hospital Stay
Admission: EM | Admit: 2019-03-02 | Discharge: 2019-03-04 | DRG: 641 | Disposition: A | Discharge status: Home Health | Payer: Medicare Other | Attending: Internal Medicine | Admitting: Internal Medicine

## 2019-03-02 ENCOUNTER — Other Ambulatory Visit: Payer: Self-pay

## 2019-03-02 DIAGNOSIS — Z79899 Other long term (current) drug therapy: Secondary | ICD-10-CM | POA: Diagnosis not present

## 2019-03-02 DIAGNOSIS — F329 Major depressive disorder, single episode, unspecified: Secondary | ICD-10-CM | POA: Diagnosis present

## 2019-03-02 DIAGNOSIS — R42 Dizziness and giddiness: Secondary | ICD-10-CM

## 2019-03-02 DIAGNOSIS — K219 Gastro-esophageal reflux disease without esophagitis: Secondary | ICD-10-CM | POA: Diagnosis present

## 2019-03-02 DIAGNOSIS — F0390 Unspecified dementia without behavioral disturbance: Secondary | ICD-10-CM | POA: Diagnosis present

## 2019-03-02 DIAGNOSIS — I129 Hypertensive chronic kidney disease with stage 1 through stage 4 chronic kidney disease, or unspecified chronic kidney disease: Secondary | ICD-10-CM | POA: Diagnosis present

## 2019-03-02 DIAGNOSIS — N179 Acute kidney failure, unspecified: Secondary | ICD-10-CM | POA: Diagnosis present

## 2019-03-02 DIAGNOSIS — Z881 Allergy status to other antibiotic agents status: Secondary | ICD-10-CM

## 2019-03-02 DIAGNOSIS — I440 Atrioventricular block, first degree: Secondary | ICD-10-CM | POA: Diagnosis present

## 2019-03-02 DIAGNOSIS — E860 Dehydration: Principal | ICD-10-CM | POA: Diagnosis present

## 2019-03-02 DIAGNOSIS — E1122 Type 2 diabetes mellitus with diabetic chronic kidney disease: Secondary | ICD-10-CM | POA: Diagnosis present

## 2019-03-02 DIAGNOSIS — Z880 Allergy status to penicillin: Secondary | ICD-10-CM

## 2019-03-02 DIAGNOSIS — Z888 Allergy status to other drugs, medicaments and biological substances status: Secondary | ICD-10-CM | POA: Diagnosis not present

## 2019-03-02 DIAGNOSIS — Z87891 Personal history of nicotine dependence: Secondary | ICD-10-CM

## 2019-03-02 DIAGNOSIS — I4510 Unspecified right bundle-branch block: Secondary | ICD-10-CM | POA: Diagnosis present

## 2019-03-02 DIAGNOSIS — N183 Chronic kidney disease, stage 3 (moderate): Secondary | ICD-10-CM | POA: Diagnosis present

## 2019-03-02 DIAGNOSIS — Z20828 Contact with and (suspected) exposure to other viral communicable diseases: Secondary | ICD-10-CM | POA: Diagnosis present

## 2019-03-02 DIAGNOSIS — R55 Syncope and collapse: Secondary | ICD-10-CM | POA: Diagnosis present

## 2019-03-02 DIAGNOSIS — I493 Ventricular premature depolarization: Secondary | ICD-10-CM | POA: Diagnosis present

## 2019-03-02 DIAGNOSIS — Z7982 Long term (current) use of aspirin: Secondary | ICD-10-CM

## 2019-03-02 DIAGNOSIS — F419 Anxiety disorder, unspecified: Secondary | ICD-10-CM | POA: Diagnosis present

## 2019-03-02 DIAGNOSIS — R531 Weakness: Secondary | ICD-10-CM | POA: Diagnosis not present

## 2019-03-02 DIAGNOSIS — E785 Hyperlipidemia, unspecified: Secondary | ICD-10-CM | POA: Diagnosis present

## 2019-03-02 DIAGNOSIS — J449 Chronic obstructive pulmonary disease, unspecified: Secondary | ICD-10-CM | POA: Diagnosis present

## 2019-03-02 DIAGNOSIS — Z85828 Personal history of other malignant neoplasm of skin: Secondary | ICD-10-CM

## 2019-03-02 LAB — URINALYSIS, COMPLETE (UACMP) WITH MICROSCOPIC
Bacteria, UA: NONE SEEN
Bilirubin Urine: NEGATIVE
Glucose, UA: NEGATIVE mg/dL
Hgb urine dipstick: NEGATIVE
Ketones, ur: NEGATIVE mg/dL
Leukocytes,Ua: NEGATIVE
Nitrite: NEGATIVE
Protein, ur: NEGATIVE mg/dL
Specific Gravity, Urine: 1.01 (ref 1.005–1.030)
Squamous Epithelial / HPF: NONE SEEN (ref 0–5)
pH: 6 (ref 5.0–8.0)

## 2019-03-02 LAB — CBC
HCT: 38.5 % (ref 36.0–46.0)
Hemoglobin: 12.9 g/dL (ref 12.0–15.0)
MCH: 32.3 pg (ref 26.0–34.0)
MCHC: 33.5 g/dL (ref 30.0–36.0)
MCV: 96.3 fL (ref 80.0–100.0)
Platelets: 217 10*3/uL (ref 150–400)
RBC: 4 MIL/uL (ref 3.87–5.11)
RDW: 12.7 % (ref 11.5–15.5)
WBC: 6.6 10*3/uL (ref 4.0–10.5)
nRBC: 0 % (ref 0.0–0.2)

## 2019-03-02 LAB — BASIC METABOLIC PANEL
Anion gap: 8 (ref 5–15)
BUN: 30 mg/dL — ABNORMAL HIGH (ref 8–23)
CO2: 30 mmol/L (ref 22–32)
Calcium: 9.9 mg/dL (ref 8.9–10.3)
Chloride: 101 mmol/L (ref 98–111)
Creatinine, Ser: 1.25 mg/dL — ABNORMAL HIGH (ref 0.44–1.00)
GFR calc Af Amer: 44 mL/min — ABNORMAL LOW (ref >60)
GFR calc non Af Amer: 38 mL/min — ABNORMAL LOW (ref >60)
Glucose, Bld: 109 mg/dL — ABNORMAL HIGH (ref 70–99)
Potassium: 4.6 mmol/L (ref 3.5–5.1)
Sodium: 139 mmol/L (ref 135–145)

## 2019-03-02 LAB — SARS CORONAVIRUS 2 BY RT PCR (HOSPITAL ORDER, PERFORMED IN ~~LOC~~ HOSPITAL LAB): SARS Coronavirus 2: NEGATIVE

## 2019-03-02 LAB — GLUCOSE, CAPILLARY
Glucose-Capillary: 99 mg/dL (ref 70–99)
Glucose-Capillary: 136 mg/dL — ABNORMAL HIGH (ref 70–99)

## 2019-03-02 LAB — BRAIN NATRIURETIC PEPTIDE: B Natriuretic Peptide: 52 pg/mL (ref 0.0–100.0)

## 2019-03-02 LAB — TROPONIN I (HIGH SENSITIVITY)
Troponin I (High Sensitivity): 6 ng/L (ref <18)
Troponin I (High Sensitivity): 6 ng/L (ref <18)

## 2019-03-02 MED ORDER — DOCUSATE SODIUM 100 MG PO CAPS
100.0000 mg | ORAL_CAPSULE | Freq: Every day | ORAL | Status: DC
  Administered 2019-03-03 – 2019-03-04 (×2): 100 mg via ORAL
  Filled 2019-03-02 (×2): qty 1

## 2019-03-02 MED ORDER — PANTOPRAZOLE SODIUM 40 MG PO TBEC
40.0000 mg | DELAYED_RELEASE_TABLET | Freq: Every day | ORAL | Status: DC
  Administered 2019-03-03 – 2019-03-04 (×2): 40 mg via ORAL
  Filled 2019-03-02 (×2): qty 1

## 2019-03-02 MED ORDER — MELOXICAM 7.5 MG PO TABS
7.5000 mg | ORAL_TABLET | ORAL | Status: DC | PRN
  Filled 2019-03-02: qty 1

## 2019-03-02 MED ORDER — SODIUM CHLORIDE 0.9% FLUSH
3.0000 mL | Freq: Two times a day (BID) | INTRAVENOUS | Status: DC
  Administered 2019-03-03 – 2019-03-04 (×2): 3 mL via INTRAVENOUS

## 2019-03-02 MED ORDER — ENOXAPARIN SODIUM 40 MG/0.4ML ~~LOC~~ SOLN
30.0000 mg | SUBCUTANEOUS | Status: DC
  Administered 2019-03-02 – 2019-03-03 (×2): 30 mg via SUBCUTANEOUS
  Filled 2019-03-02 (×2): qty 0.4

## 2019-03-02 MED ORDER — HYPROMELLOSE (GONIOSCOPIC) 2.5 % OP SOLN
1.0000 [drp] | Freq: Three times a day (TID) | OPHTHALMIC | Status: DC | PRN
  Filled 2019-03-02: qty 15

## 2019-03-02 MED ORDER — MONTELUKAST SODIUM 10 MG PO TABS
10.0000 mg | ORAL_TABLET | Freq: Every day | ORAL | Status: DC
  Administered 2019-03-02 – 2019-03-03 (×2): 10 mg via ORAL
  Filled 2019-03-02 (×2): qty 1

## 2019-03-02 MED ORDER — OMEGA-3-ACID ETHYL ESTERS 1 G PO CAPS
1.0000 g | ORAL_CAPSULE | Freq: Every day | ORAL | Status: DC
  Administered 2019-03-02 – 2019-03-04 (×3): 1 g via ORAL
  Filled 2019-03-02 (×3): qty 1

## 2019-03-02 MED ORDER — LORAZEPAM 0.5 MG PO TABS: 0.5000 mg | ORAL_TABLET | Freq: Two times a day (BID) | ORAL | Status: DC

## 2019-03-02 MED ORDER — ADULT MULTIVITAMIN W/MINERALS CH
1.0000 | ORAL_TABLET | Freq: Every day | ORAL | Status: DC
  Administered 2019-03-03 – 2019-03-04 (×2): 1 via ORAL
  Filled 2019-03-02 (×2): qty 1

## 2019-03-02 MED ORDER — LORAZEPAM 0.5 MG PO TABS: 0.5000 mg | ORAL_TABLET | Freq: Four times a day (QID) | ORAL | Status: DC | PRN

## 2019-03-02 MED ORDER — CARBOXYMETHYLCELLULOSE SODIUM 0.5 % OP SOLN: 1.0000 [drp] | Freq: Three times a day (TID) | OPHTHALMIC | Status: DC | PRN

## 2019-03-02 MED ORDER — ASPIRIN EC 81 MG PO TBEC
81.0000 mg | DELAYED_RELEASE_TABLET | Freq: Every day | ORAL | Status: DC
  Administered 2019-03-03 – 2019-03-04 (×2): 81 mg via ORAL
  Filled 2019-03-02 (×2): qty 1

## 2019-03-02 MED ORDER — LORATADINE 10 MG PO TABS
10.0000 mg | ORAL_TABLET | Freq: Every day | ORAL | Status: DC
  Administered 2019-03-03 – 2019-03-04 (×2): 10 mg via ORAL
  Filled 2019-03-02 (×2): qty 1

## 2019-03-02 MED ORDER — BISMUTH SUBSALICYLATE 262 MG/15ML PO SUSP
30.0000 mL | Freq: Four times a day (QID) | ORAL | Status: DC | PRN
  Filled 2019-03-02: qty 118

## 2019-03-02 MED ORDER — SODIUM CHLORIDE 0.9 % IV SOLN
INTRAVENOUS | Status: DC
  Administered 2019-03-02 – 2019-03-03 (×2): via INTRAVENOUS

## 2019-03-02 MED ORDER — ACETAMINOPHEN 500 MG PO TABS: 500.0000 mg | ORAL_TABLET | Freq: Four times a day (QID) | ORAL | Status: DC | PRN

## 2019-03-02 MED ORDER — ESCITALOPRAM OXALATE 10 MG PO TABS
10.0000 mg | ORAL_TABLET | ORAL | Status: DC
  Administered 2019-03-03 – 2019-03-04 (×2): 10 mg via ORAL
  Filled 2019-03-02 (×2): qty 1

## 2019-03-02 NOTE — ED Notes (Signed)
This Rn attempted for blood x 2, unsuccessful IV start.

## 2019-03-02 NOTE — ED Notes (Signed)
EDP at bedside at this time.  

## 2019-03-02 NOTE — ED Triage Notes (Signed)
Patient states she was brushing her teeth this morning when she had a "funny feeling come down". Patient's daughter states she hasn't been feeling good recently. Patient complains of continued weakness. Patient with facial symmetry and strength. No drift noted in triage.

## 2019-03-02 NOTE — ED Notes (Signed)
Orthostatics obtained by this RN, pt assisted to the toilet to allow patient to urinate. Pt tolerated well. Pt back to bed without difficulty. Pt's daughter remains at bedside. Will continue to monitor for further patient needs.

## 2019-03-02 NOTE — Progress Notes (Signed)
Family Meeting Note  Advance Directive:yes  Today a meeting took place with the Patient and daughter.  The following clinical team members were present during this meeting:MD  The following were discussed:Patient's diagnosis: COPD, Htn, DM, syncope , Patient's progosis: Unable to determine and Goals for treatment: Full Code  Daughter is POA, said- she had DNR order but later they also did MOLST form-where they stated if it is for short-term or there are chances of recovery then do everything. I explained the difference and after my explanation daughter agrees to have her full code for short.  Of time will for reversible conditions and then family would make the decisions but most likely they might not continue her for long time on life support measures In short, she is a full code.  Additional follow-up to be provided: PMD  Time spent during discussion:20 minutes  Vaughan Basta, MD

## 2019-03-02 NOTE — ED Notes (Signed)
ED TO INPATIENT HANDOFF REPORT  ED Nurse Name and Phone #: Antonieta Pert, RN [phone]  S Name/Age/Gender Erica Donovan 85 y.o. female Room/Bed: ED24A/ED24A  Code Status   Code Status: Full Code  Home/SNF/Other Home Patient oriented to: self, place and situation Is this baseline? Yes   Triage Complete: Triage complete  Chief Complaint weakness  Triage Note Patient states she was brushing her teeth this morning when she had a "funny feeling come down". Patient's daughter states she hasn't been feeling good recently. Patient complains of continued weakness. Patient with facial symmetry and strength. No drift noted in triage.    Allergies Allergies  Allergen Reactions  . Penicillins Nausea Only    Did it involve swelling of the face/tongue/throat, SOB, or low BP? Unknown Did it involve sudden or severe rash/hives, skin peeling, or any reaction on the inside of your mouth or nose? Unknown Did you need to seek medical attention at a hospital or doctor's office? Unknown When did it last happen?Many years ago. If all above answers are "NO", may proceed with cephalosporin use.   . Ciprofloxacin Nausea Only  . Nitrofurantoin Itching and Rash    Level of Care/Admitting Diagnosis ED Disposition    ED Disposition Condition [facility] Area: [place] [[id number]]  Level of Care: Telemetry [5]  Covid Evaluation: Asymptomatic Screening Protocol (No Symptoms)  Diagnosis: Postural dizziness with near syncope [id number]  Admitting Physician: Vaughan Basta [id number]  Attending Physician: Rufina Falco ACHIENG [id number]  Estimated length of stay: past midnight tomorrow  Certification:: I certify this patient will need inpatient services for at least 2 midnights  PT Class (Do Not Modify): Inpatient [101]  PT Acc Code (Do Not Modify): Private [1]       B Medical/Surgery History Past Medical History:  Diagnosis Date  . Arthritis   .  Cancer ([place])    skin cancer  . COPD (chronic obstructive pulmonary disease) (HCC)    mild- no inhalers  . Dementia ([place])   . Depression   . Diabetes mellitus without complication ([place])   . 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 GERD (gastroesophageal reflux disease)   . Hemorrhoids   . History of kidney stones   . Hyperlipidemia   . Hypertension   . Leaky heart valve   . Paroxysmal SVT (supraventricular tachycardia) (HCC)    Past Surgical History:  Procedure Laterality Date  . ABDOMINAL HYSTERECTOMY    . APPENDECTOMY  1945  . EYE SURGERY     cataract bil  . HEMORRHOID SURGERY    . JOINT REPLACEMENT Left 2008  . ORIF WRIST FRACTURE Right 11/06/2018   Procedure: OPEN REDUCTION INTERNAL FIXATION (ORIF) WRIST FRACTURE RIGHT - DIABETIC, SLEEP APNEA;  Surgeon: Hessie Knows, MD;  Location: [facility] ORS;  Service: Orthopedics;  Laterality: Right;     A IV Location/Drains/Wounds Patient Lines/Drains/Airways Status   Active Line/Drains/Airways    Name:   Placement date:   Placement time:   Site:   Days:   Peripheral IV 03/02/19 Left;Anterior Forearm   03/02/19    1620    Forearm   less than 1   Incision (Closed) 11/06/18 Wrist Right   11/06/18    1452     116          Intake/Output Last 24 hours No intake or output data in the 24 hours ending 03/02/19 1708  Labs/Imaging  Results for orders placed or performed during the hospital encounter of 03/02/19 (from the past 48 hour(s))  Basic metabolic panel     Status: Abnormal   Collection Time: 03/02/19  9:25 AM  Result Value Ref Range   Sodium 139 135 - 145 mmol/L   Potassium 4.6 3.5 - 5.1 mmol/L   Chloride 101 98 - 111 mmol/L   CO2 30 22 - 32 mmol/L   Glucose, Bld 109 (H) 70 - 99 mg/dL   BUN 30 (H) 8 - 23 mg/dL   Creatinine, Ser 1.25 (H) 0.44 - 1.00 mg/dL   Calcium 9.9 8.9 - 10.3 mg/dL   GFR calc non Af Amer 38 (L) >60 mL/min   GFR calc Af Amer 44 (L) >60 mL/min    Anion gap 8 5 - 15    Comment: Performed at [facility], [address] [place], [place] [address]  CBC     Status: None   Collection Time: 03/02/19  9:25 AM  Result Value Ref Range   WBC 6.6 4.0 - 10.5 K/uL   RBC 4.00 3.87 - 5.11 MIL/uL   Hemoglobin 12.9 12.0 - 15.0 g/dL   HCT 38.5 36.0 - 46.0 %   MCV 96.3 80.0 - 100.0 fL   MCH 32.3 26.0 - 34.0 pg   MCHC 33.5 30.0 - 36.0 g/dL   RDW 12.7 11.5 - 15.5 %   Platelets 217 150 - 400 K/uL   nRBC 0.0 0.0 - 0.2 %    Comment: Performed at [facility], [address] [place], [place] [address]  Glucose, capillary     Status: None   Collection Time: 03/02/19  9:25 AM  Result Value Ref Range   Glucose-Capillary 99 70 - 99 mg/dL   Comment 1 Notify RN    Comment 2 Document in Chart   Brain natriuretic peptide     Status: None   Collection Time: 03/02/19  9:25 AM  Result Value Ref Range   B Natriuretic Peptide 52.0 0.0 - 100.0 pg/mL    Comment: Performed at [facility], [address] [place], [place] [address]  Urinalysis, Complete w Microscopic     Status: Abnormal   Collection Time: 03/02/19 11:47 AM  Result Value Ref Range   Color, Urine YELLOW (A) YELLOW   APPearance CLEAR (A) CLEAR   Specific Gravity, Urine 1.010 1.005 - 1.030   pH 6.0 5.0 - 8.0   Glucose, UA NEGATIVE NEGATIVE mg/dL   Hgb urine dipstick NEGATIVE NEGATIVE   Bilirubin Urine NEGATIVE NEGATIVE   Ketones, ur NEGATIVE NEGATIVE mg/dL   Protein, ur NEGATIVE NEGATIVE mg/dL   Nitrite NEGATIVE NEGATIVE   Leukocytes,Ua NEGATIVE NEGATIVE   RBC / HPF 0-5 0 - 5 RBC/hpf   WBC, UA 0-5 0 - 5 WBC/hpf   Bacteria, UA NONE SEEN NONE SEEN   Squamous Epithelial / LPF NONE SEEN 0 - 5    Comment: Performed at [facility], [address] [place], [place] [address]   Dg Chest 2 View  Result Date: 03/02/2019 CLINICAL DATA:  Shortness of breath, feeling funny, weakness, history COPD, dementia, hypertension EXAM: CHEST - 2 VIEW COMPARISON:   09/29/2015 FINDINGS: Enlargement of cardiac silhouette. Mediastinal contours and pulmonary vascularity normal. Atherosclerotic calcification aorta. Lungs hyperinflated but clear. No pulmonary infiltrate, pleural effusion or pneumothorax. Bones demineralized. IMPRESSION: Hyperinflated lungs without acute infiltrate. Enlargement of cardiac silhouette. Electronically Signed   By: Lavonia Dana M.D.   On: 03/02/2019 13:04  Ct Head Wo Contrast  Result Date: 03/02/2019 CLINICAL DATA:  Altered mental status EXAM: CT HEAD WITHOUT CONTRAST TECHNIQUE: Contiguous axial images were obtained from the base of the skull through the vertex without intravenous contrast. COMPARISON:  CT dated 03/16/2018. FINDINGS: Brain: No evidence of acute infarction, hemorrhage, hydrocephalus, extra-axial collection or mass lesion/mass effect. Age related atrophy is noted. Vascular: No hyperdense vessel or unexpected calcification. Skull: Normal. Negative for fracture or focal lesion. Sinuses/Orbits: No acute finding. Other: None. IMPRESSION: No acute intracranial abnormality. Electronically Signed   By: Constance Holster M.D.   On: 03/02/2019 15:06    Pending Labs Unresulted Labs (From admission, onward)    Start     Ordered   03/02/19 1545  SARS Coronavirus 2 [facility] order, Performed in [facility] hospital lab)  Once,   R     03/02/19 1545          Vitals/Pain Today's Vitals   03/02/19 1615 03/02/19 1630 03/02/19 1645 03/02/19 1700  BP:  (!) 155/70  (!) 139/58  Pulse: 77 78 78 85  Resp: 16 14 (!) 22 11  Temp:      TempSrc:      SpO2: 99% 98% 99% 100%  Weight:      Height:      PainSc:        Isolation Precautions No active isolations  Medications Medications  aspirin EC tablet 81 mg (has no administration in time range)  acetaminophen (TYLENOL) tablet 500 mg (has no administration in time range)  meloxicam (MOBIC) tablet 7.5 mg (has no administration in time range)  escitalopram (LEXAPRO) tablet 10 mg  (has no administration in time range)  LORazepam (ATIVAN) tablet 0.5 mg (has no administration in time range)  bismuth subsalicylate (PEPTO BISMOL) 262 MG/15ML suspension 30 mL (has no administration in time range)  docusate sodium (COLACE) capsule 100 mg (has no administration in time range)  pantoprazole (PROTONIX) EC tablet 40 mg (has no administration in time range)  multivitamin with minerals tablet 1 tablet (has no administration in time range)  omega-3 acid ethyl esters (LOVAZA) capsule 1 g (has no administration in time range)  loratadine (CLARITIN) tablet 10 mg (has no administration in time range)  montelukast (SINGULAIR) tablet 10 mg (has no administration in time range)  sodium chloride flush (NS) 0.9 % injection 3 mL (has no administration in time range)  enoxaparin (LOVENOX) injection 30 mg (has no administration in time range)  0.9 %  sodium chloride infusion (has no administration in time range)  hydroxypropyl methylcellulose / hypromellose (ISOPTO TEARS / GONIOVISC) 2.5 % ophthalmic solution 1 drop (has no administration in time range)    Mobility walks with device Low fall risk   Focused Assessments Cardiac Assessment Handoff:    Lab Results  Component Value Date   CKTOTAL 29 03/02/2013   CKMB < 0.5 (L) 03/02/2013   TROPONINI < 0.02 03/02/2013   No results found for: DDIMER Does the Patient currently have chest pain? No  , Pulmonary Assessment Handoff:  Lung sounds:   O2 Device: Room Air        R Recommendations: See Admitting Provider Note  Report given to:   Additional Notes:

## 2019-03-02 NOTE — ED Notes (Signed)
Pt returned from CT at this time.  

## 2019-03-02 NOTE — H&P (Signed)
[facility] [place] NAME: Erica Donovan    MR#:  [id number]  DATE OF BIRTH:  Oct 02, 1925  DATE OF ADMISSION:  03/02/2019  PRIMARY CARE PHYSICIAN: Juluis Pitch, MD   REQUESTING/REFERRING PHYSICIAN: Duffy Bruce, MD  CHIEF COMPLAINT:   Chief Complaint  Patient presents with  . Weakness   HISTORY OF PRESENT ILLNESS:   85 year old female with past medical history of skin cancer, mild COPD, dementia, depression, diabetes mellitus without complication, hyperlipidemia, hypertension, GERD, anxiety and depression, paroxysmal SVT, CKD, anemia, and hyponatremia presenting to the ED with near syncopal episode.  Patient states she was brushing her teeth this morning when she suddenly had a rush of "funny warm sensation sweeping through her forehead". She got up and suddenly felt very lightheaded with near syncope.  Per patient's daughter who is currently at the bedside, patient looked pale and was staring blankly for a few seconds.  Per daughter she has not been feeling well the past few weeks.  Patient denies loss of consciousness, palpitation, chest pain, diaphoresis, shortness of breath, nausea or vomiting, headache, or any other associated focal neurologic deficit with the episode.  On arrival to the ED, she was afebrile with blood pressure 152/66 mm Hg and pulse rate 85 beats/min. There were no focal neurological deficits; she was alert and oriented x4, and she did not demonstrate any memory deficits.  Initial labs revealed elevated BUN of 30, creatinine 1.25 was 1.11 12/10/2018, CBC unremarkable, BNP 52, UA shows no evidence of UTI, COVID-19 negative, and troponin within normal limits.  X-ray showed hyperinflated lungs without acute infiltrate.  A noncontrast CT head was obtained and showed no acute intracranial abnormality.  PAST MEDICAL HISTORY:   Past Medical History:  Diagnosis Date  . Arthritis   . Cancer ([place])    skin cancer  . COPD  (chronic obstructive pulmonary disease) (HCC)    mild- no inhalers  . Dementia ([place])   . Depression   . Diabetes mellitus without complication ([place])   . 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 GERD (gastroesophageal reflux disease)   . Hemorrhoids   . History of kidney stones   . Hyperlipidemia   . Hypertension   . Leaky heart valve   . Paroxysmal SVT (supraventricular tachycardia) (HCC)     PAST SURGICAL HISTORY:   Past Surgical History:  Procedure Laterality Date  . ABDOMINAL HYSTERECTOMY    . APPENDECTOMY  1945  . EYE SURGERY     cataract bil  . HEMORRHOID SURGERY    . JOINT REPLACEMENT Left 2008  . ORIF WRIST FRACTURE Right 11/06/2018   Procedure: OPEN REDUCTION INTERNAL FIXATION (ORIF) WRIST FRACTURE RIGHT - DIABETIC, SLEEP APNEA;  Surgeon: Hessie Knows, MD;  Location: [facility] ORS;  Service: Orthopedics;  Laterality: Right;    SOCIAL HISTORY:   Social History   Tobacco Use  . Smoking status: Former Smoker    Types: Cigarettes    Quit date: 11/04/1973    Years since quitting: 45.3  . Smokeless tobacco: Never Used  Substance Use Topics  . Alcohol use: Never    Frequency: Never    FAMILY HISTORY:  No family history on file.  DRUG ALLERGIES:   Allergies  Allergen Reactions  . Penicillins Nausea Only    Did it involve swelling of the face/tongue/throat, SOB, or low BP? Unknown Did it involve sudden or severe rash/hives, skin  peeling, or any reaction on the inside of your mouth or nose? Unknown Did you need to seek medical attention at a hospital or doctor's office? Unknown When did it last happen?Many years ago. If all above answers are "NO", may proceed with cephalosporin use.   . Ciprofloxacin Nausea Only  . Nitrofurantoin Itching and Rash    REVIEW OF SYSTEMS:   Review of Systems  Constitutional: Negative for chills, fever, malaise/fatigue and weight loss.  HENT: Negative for congestion,  hearing loss and sore throat.   Eyes: Negative for blurred vision and double vision.  Respiratory: Negative for cough, shortness of breath and wheezing.   Cardiovascular: Negative for chest pain, palpitations, orthopnea and leg swelling.  Gastrointestinal: Negative for abdominal pain, diarrhea, nausea and vomiting.  Genitourinary: Negative for dysuria and urgency.  Musculoskeletal: Positive for joint pain. Negative for myalgias.  Skin: Negative for rash.  Neurological: Positive for dizziness and weakness. Negative for sensory change, speech change, focal weakness and headaches.  Psychiatric/Behavioral: Negative for depression.   MEDICATIONS AT HOME:   Prior to Admission medications   Medication Sig Start Date End Date Taking? Authorizing Provider  aspirin EC 81 MG tablet Take 81 mg by mouth daily.   Yes [provider]  escitalopram (LEXAPRO) 10 MG tablet Take 10 mg by mouth every morning.  10/27/18  Yes [provider]  loratadine (CLARITIN) 10 MG tablet Take 10 mg by mouth every morning.   Yes [provider]  montelukast (SINGULAIR) 10 MG tablet Take 10 mg by mouth at bedtime. 01/07/19 01/07/20 Yes [provider]  Multiple Vitamin (MULTIVITAMIN WITH MINERALS) TABS tablet Take 1 tablet by mouth daily.   Yes [provider]  Omega-3 Fatty Acids (FISH OIL) 1000 MG CAPS Take 1,000 mg by mouth 3 (three) times daily.   Yes [provider]  omeprazole (PRILOSEC) 40 MG capsule Take 40 mg by mouth 2 (two) times a day. 08/07/18  Yes [provider]  acetaminophen (TYLENOL) 500 MG tablet Take 500-1,000 mg by mouth every 6 (six) hours as needed for mild pain.    [provider]  bismuth subsalicylate (PEPTO BISMOL) 262 MG/15ML suspension Take 30 mLs by mouth every 6 (six) hours as needed.    [provider]  carboxymethylcellulose (REFRESH PLUS) 0.5 % SOLN Place 1 drop into both eyes 3 (three) times daily as needed (dry eyes).     [provider]  cetirizine (ZYRTEC) 10 MG tablet Take 10 mg by mouth daily.    [provider]  diphenhydrAMINE (BENADRYL) 25 MG tablet Take 25 mg by mouth every 8 (eight) hours as needed for itching.    [provider]  docusate sodium (COLACE) 100 MG capsule Take 100 mg by mouth daily at 3 pm. And prn    [provider]  HYDROcodone-acetaminophen (NORCO) 5-325 MG tablet Take 1 tablet by mouth every 6 (six) hours as needed. Patient not taking: Reported on 03/02/2019 11/06/18   Hessie Knows, MD  LORazepam (ATIVAN) 0.5 MG tablet Take 0.5 mg by mouth 2 (two) times daily.     [provider]  meloxicam (MOBIC) 7.5 MG tablet Take 7.5 mg by mouth as needed for pain.    [provider]  Sennosides (EX-LAX PO) Take by mouth as needed.    [provider]  vitamin C (ASCORBIC ACID) 500 MG tablet Take 500 mg by mouth as needed.    [provider]      VITAL SIGNS:  Blood pressure (!) 172/66, pulse 82, temperature 97.9 F (36.6 C), temperature source Oral, resp. rate 20, height 5\' 6"  (1.676 m), weight 71.5 kg, SpO2 93 %.  PHYSICAL EXAMINATION:   Physical Exam  GENERAL:  85 y.o.-year-old patient lying in the bed with no acute distress.  EYES: Pupils equal, round, reactive to light and accommodation. No scleral icterus. Extraocular muscles intact.  HEENT: Head atraumatic, normocephalic. Oropharynx and nasopharynx clear.  NECK:  Supple, no jugular venous distention. No thyroid enlargement, no tenderness.  LUNGS: Normal breath sounds bilaterally, no wheezing, rales,rhonchi or crepitation. No use of accessory muscles of respiration.  CARDIOVASCULAR: S1, S2 normal. No murmurs, rubs, or gallops.  ABDOMEN: Soft, nontender, nondistended. Bowel sounds present. No organomegaly or mass.  EXTREMITIES: No pedal edema, cyanosis, or clubbing. No rash or lesions. + pedal pulses MUSCULOSKELETAL: Normal bulk, and power was 5+ grip and elbow,  knee, and ankle flexion and extension bilaterally.  NEUROLOGIC:Alert and oriented x 3. CN 2-12 intact. Sensation to light touch and cold stimuli intact bilaterally. Gait not tested due to safety concern. PSYCHIATRIC: The patient is alert and oriented x 3.  SKIN: No obvious rash, lesion, or ulcer.   DATA REVIEWED:  LABORATORY PANEL:   CBC Recent Labs  Lab 03/02/19 0925  WBC 6.6  HGB 12.9  HCT 38.5  PLT 217   ------------------------------------------------------------------------------------------------------------------  Chemistries  Recent Labs  Lab 03/02/19 0925  NA 139  K 4.6  CL 101  CO2 30  GLUCOSE 109*  BUN 30*  CREATININE 1.25*  CALCIUM 9.9   ------------------------------------------------------------------------------------------------------------------  Cardiac Enzymes No results for input(s): TROPONINI in the last 168 hours. ------------------------------------------------------------------------------------------------------------------  RADIOLOGY:  Dg Chest 2 View  Result Date: 03/02/2019 CLINICAL DATA:  Shortness of breath, feeling funny, weakness, history COPD, dementia, hypertension EXAM: CHEST - 2 VIEW COMPARISON:  09/29/2015 FINDINGS: Enlargement of cardiac silhouette. Mediastinal contours and pulmonary vascularity normal. Atherosclerotic calcification aorta. Lungs hyperinflated but clear. No pulmonary infiltrate, pleural effusion or pneumothorax. Bones demineralized. IMPRESSION: Hyperinflated lungs without acute infiltrate. Enlargement of cardiac silhouette. Electronically Signed   By: Lavonia Dana M.D.   On: 03/02/2019 13:04   Ct Head Wo Contrast  Result Date: 03/02/2019 CLINICAL DATA:  Altered mental status EXAM: CT HEAD WITHOUT CONTRAST TECHNIQUE: Contiguous axial images were obtained from the base of the skull through the vertex without intravenous contrast. COMPARISON:  CT dated 03/16/2018. FINDINGS: Brain: No evidence of acute infarction,  hemorrhage, hydrocephalus, extra-axial collection or mass lesion/mass effect. Age related atrophy is noted. Vascular: No hyperdense vessel or unexpected calcification. Skull: Normal. Negative for fracture or focal lesion. Sinuses/Orbits: No acute finding. Other: None. IMPRESSION: No acute intracranial abnormality. Electronically Signed   By: Constance Holster M.D.   On: 03/02/2019 15:06    EKG:  EKG: unchanged from previous tracings, normal sinus rhythm, 1st degree AV block. Vent. rate 86 BPM PR interval 212 ms QRS duration 122 ms QT/QTc 386/461 ms P-R-T axes 78 12 32 IMPRESSION AND PLAN:   85 y.o. female  with past medical history of skin cancer, mild COPD, dementia, depression, diabetes mellitus without complication, hyperlipidemia, hypertension, GERD, anxiety and depression, paroxysmal SVT, CKD, anemia, and hyponatremia presenting to the ED with near syncopal episode.  1. Near syncopal episode - Etiology unclear, patient has hx of paroxysmal SVT therefore cannot rule cardiac causes - Admit to telemetry - CT head without acute intracranial abnormality - will obtain Echo - Check - Check Orthostatic Vital Signs - Obtain EEG given atypical presentation concerning  for possible seizures - Order Carotid Dopplers - Check TSH  2. Acute on chronic kidney failure - BUN/creatinine elevated above baseline - Avoid nephrotoxins - Continue to monitor renal function  3. Diabetes Mellitus Type 2 without complications - Diet controlled - recent Hgb A1c 6.1 - SSI  4. GERD - Protonix  5. HTN  + Goal BP <130/80 - Stable not on any medication, use PRN antihypetensive  6. Depression and Anxiety - Continue Lexapro and Lorazepam  7. DVT prophylaxis - Enoxaparin SubQ    All the records are reviewed and case discussed with ED provider. Management plans discussed with the patient, family and they are in agreement.  CODE STATUS: FULL  TOTAL TIME TAKING CARE OF THIS PATIENT: 50 minutes.     on 03/02/2019 at 7:04 PM  Rufina Falco, DNP, FNP-BC [facility] Nurse Practitioner Between 7am to 6pm - Pager [phone]  After 6pm go to [email or web address] - password [email or web address]  Office  [phone]  CC: Primary care physician; Juluis Pitch, MD

## 2019-03-02 NOTE — ED Provider Notes (Signed)
[facility] Emergency Department Provider Note  ____________________________________________   First MD Initiated Contact with Patient 03/02/19 1135     (approximate)  I have reviewed the triage vital signs and the nursing notes.   HISTORY  Chief Complaint Weakness    HPI Erica Donovan is a 85 y.o. female here with generalized weakness.  The patient states that she was in her usual state of health but family states that she has been generally weak for the last several weeks.  She was fixing her teeth today when she developed what she describes as a flushed and lightheaded/dizzy sensation that spread from her head down.  She felt very weak like she is going to pass out.  She states she had no focal weakness or numbness.  The room was not spinning.  She states that since then, this is come and gone multiple times.  It is not associated with position changes.  No recent medication changes.  She does reportedly have history of paroxysmal SVT as well as valvular disease.  No other complaints.  No recent medication changes.        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-[facility] keeps check on this   GERD (gastroesophageal reflux disease)    Hemorrhoids    History of kidney stones    Hyperlipidemia    Hypertension    Leaky heart valve    Paroxysmal SVT (supraventricular tachycardia) ([place])     Patient Active Problem List   Diagnosis Date Noted   Postural dizziness with near syncope 03/02/2019    Past Surgical History:  Procedure Laterality Date   ABDOMINAL HYSTERECTOMY     APPENDECTOMY  1945   EYE SURGERY     cataract bil   HEMORRHOID SURGERY     JOINT  REPLACEMENT Left 2008   ORIF WRIST FRACTURE Right 11/06/2018   Procedure: OPEN REDUCTION INTERNAL FIXATION (ORIF) WRIST FRACTURE RIGHT - DIABETIC, SLEEP APNEA;  Surgeon: Hessie Knows, MD;  Location: [facility] ORS;  Service: Orthopedics;  Laterality: Right;    Prior to Admission medications   Medication Sig Start Date End Date Taking? Authorizing Provider  aspirin EC 81 MG tablet Take 81 mg by mouth daily.   Yes [provider]  escitalopram (LEXAPRO) 10 MG tablet Take 10 mg by mouth every morning.  10/27/18  Yes [provider]  loratadine (CLARITIN) 10 MG tablet Take 10 mg by mouth every morning.   Yes [provider]  montelukast (SINGULAIR) 10 MG tablet Take 10 mg by mouth at bedtime. 01/07/19 01/07/20 Yes [provider]  Multiple Vitamin (MULTIVITAMIN WITH MINERALS) TABS tablet Take 1 tablet by mouth daily.   Yes [provider]  Omega-3 Fatty Acids (FISH OIL) 1000 MG CAPS Take 1,000 mg by mouth 3 (three) times daily.   Yes [provider]  omeprazole (PRILOSEC) 40 MG capsule Take 40 mg by mouth 2 (two) times a day. 08/07/18  Yes [provider]  acetaminophen (TYLENOL) 500 MG tablet Take 500-1,000 mg by mouth every 6 (six) hours as needed for mild pain.    [provider]  bismuth subsalicylate (PEPTO BISMOL) 262  MG/15ML suspension Take 30 mLs by mouth every 6 (six) hours as needed.    [provider]  carboxymethylcellulose (REFRESH PLUS) 0.5 % SOLN Place 1 drop into both eyes 3 (three) times daily as needed (dry eyes).    [provider]  cetirizine (ZYRTEC) 10 MG tablet Take 10 mg by mouth daily.    [provider]  diphenhydrAMINE (BENADRYL) 25 MG tablet Take 25 mg by mouth every 8 (eight) hours as needed for itching.    [provider]  docusate sodium (COLACE) 100 MG capsule Take 100 mg by mouth daily at 3 pm. And prn    [provider]  HYDROcodone-acetaminophen (NORCO) 5-325 MG  tablet Take 1 tablet by mouth every 6 (six) hours as needed. Patient not taking: Reported on 03/02/2019 11/06/18   Hessie Knows, MD  LORazepam (ATIVAN) 0.5 MG tablet Take 0.5 mg by mouth 2 (two) times daily.     [provider]  meloxicam (MOBIC) 7.5 MG tablet Take 7.5 mg by mouth as needed for pain.    [provider]  Sennosides (EX-LAX PO) Take by mouth as needed.    [provider]  vitamin C (ASCORBIC ACID) 500 MG tablet Take 500 mg by mouth as needed.    [provider]    Allergies Penicillins, Ciprofloxacin, and Nitrofurantoin  No family history on file.  Social History Social History   Tobacco Use   Smoking status: Former Smoker    Types: Cigarettes    Quit date: 11/04/1973    Years since quitting: 45.3   Smokeless tobacco: Never Used  Substance Use Topics   Alcohol use: Never    Frequency: Never   Drug use: Never    Review of Systems  Review of Systems  Constitutional: Positive for fatigue. Negative for fever.  HENT: Negative for congestion and sore throat.   Eyes: Negative for visual disturbance.  Respiratory: Negative for cough and shortness of breath.   Cardiovascular: Negative for chest pain.  Gastrointestinal: Negative for abdominal pain, diarrhea, nausea and vomiting.  Genitourinary: Negative for flank pain.  Musculoskeletal: Negative for back pain and neck pain.  Skin: Negative for rash and wound.  Neurological: Positive for weakness and light-headedness.     ____________________________________________  PHYSICAL EXAM:      VITAL SIGNS: ED Triage Vitals  Enc Vitals Group     BP 03/02/19 0915 (!) 152/66     Pulse Rate 03/02/19 0915 85     Resp 03/02/19 0915 16     Temp 03/02/19 0915 98.9 F (37.2 C)     Temp Source 03/02/19 0915 Oral     SpO2 03/02/19 0915 96 %     Weight 03/02/19 0915 162 lb (73.5 kg)     Height 03/02/19 0915 5\' 4"  (1.626 m)     Head Circumference --      Peak Flow --      Pain Score  03/02/19 0922 4     Pain Loc --      Pain Edu? --      Excl. in [place]? --      Physical Exam Vitals signs and nursing note reviewed.  Constitutional:      General: She is not in acute distress.    Appearance: She is well-developed.  HENT:     Head: Normocephalic and atraumatic.  Eyes:     Conjunctiva/sclera: Conjunctivae normal.  Neck:     Musculoskeletal: Neck supple.  Cardiovascular:     Rate  and Rhythm: Normal rate and regular rhythm.     Heart sounds: Normal heart sounds. No murmur. No friction rub.  Pulmonary:     Effort: Pulmonary effort is normal. No respiratory distress.     Breath sounds: Normal breath sounds. No wheezing or rales.  Abdominal:     General: There is no distension.     Palpations: Abdomen is soft.     Tenderness: There is no abdominal tenderness.  Skin:    General: Skin is warm.     Capillary Refill: Capillary refill takes less than 2 seconds.  Neurological:     Mental Status: She is alert and oriented to person, place, and time.     GCS: GCS eye subscore is 4. GCS verbal subscore is 5. GCS motor subscore is 6.     Cranial Nerves: Cranial nerves are intact.     Sensory: Sensation is intact.     Motor: Motor function is intact. No abnormal muscle tone.       ____________________________________________   LABS (all labs ordered are listed, but only abnormal results are displayed)  Labs Reviewed  BASIC METABOLIC PANEL - Abnormal; Notable for the following components:      Result Value   Glucose, Bld 109 (*)    BUN 30 (*)    Creatinine, Ser 1.25 (*)    GFR calc non Af Amer 38 (*)    GFR calc Af Amer 44 (*)    All other components within normal limits  URINALYSIS, COMPLETE (UACMP) WITH MICROSCOPIC - Abnormal; Notable for the following components:   Color, Urine YELLOW (*)    APPearance CLEAR (*)    All other components within normal limits  SARS CORONAVIRUS 2 (HOSPITAL ORDER, [facility])  CBC  GLUCOSE, CAPILLARY    BRAIN NATRIURETIC PEPTIDE  CBG MONITORING, ED  TROPONIN I (HIGH SENSITIVITY)  TROPONIN I (HIGH SENSITIVITY)    ____________________________________________  EKG: Normal sinus rhythm with first-degree AV block.  Right bundle branch block noted.  Ventricular rate 86.  No acute ischemic changes. ________________________________________  RADIOLOGY All imaging, including plain films, CT scans, and ultrasounds, independently reviewed by me, and interpretations confirmed via formal radiology reads.  ED MD interpretation:   Chest x-ray: Enlarged cardiac silhouette  Official radiology report(s): Dg Chest 2 View  Result Date: 03/02/2019 CLINICAL DATA:  Shortness of breath, feeling funny, weakness, history COPD, dementia, hypertension EXAM: CHEST - 2 VIEW COMPARISON:  09/29/2015 FINDINGS: Enlargement of cardiac silhouette. Mediastinal contours and pulmonary vascularity normal. Atherosclerotic calcification aorta. Lungs hyperinflated but clear. No pulmonary infiltrate, pleural effusion or pneumothorax. Bones demineralized. IMPRESSION: Hyperinflated lungs without acute infiltrate. Enlargement of cardiac silhouette. Electronically Signed   By: Lavonia Dana M.D.   On: 03/02/2019 13:04   Ct Head Wo Contrast  Result Date: 03/02/2019 CLINICAL DATA:  Altered mental status EXAM: CT HEAD WITHOUT CONTRAST TECHNIQUE: Contiguous axial images were obtained from the base of the skull through the vertex without intravenous contrast. COMPARISON:  CT dated 03/16/2018. FINDINGS: Brain: No evidence of acute infarction, hemorrhage, hydrocephalus, extra-axial collection or mass lesion/mass effect. Age related atrophy is noted. Vascular: No hyperdense vessel or unexpected calcification. Skull: Normal. Negative for fracture or focal lesion. Sinuses/Orbits: No acute finding. Other: None. IMPRESSION: No acute intracranial abnormality. Electronically Signed   By: Constance Holster M.D.   On: 03/02/2019 15:06     ____________________________________________  PROCEDURES   Procedure(s) performed (including Critical Care):  Procedures  ____________________________________________  INITIAL IMPRESSION /  MDM / ASSESSMENT AND PLAN / ED COURSE  As part of my medical decision making, I reviewed the following data within the electronic MEDICAL RECORD NUMBER Notes from prior ED visits and [place] Controlled Substance Database      *DOROTHA SCHIFFNER was evaluated in Emergency Department on 03/02/2019 for the symptoms described in the history of present illness. She was evaluated in the context of the global COVID-19 pandemic, which necessitated consideration that the patient might be at risk for infection with the SARS-CoV-2 virus that causes COVID-19. Institutional protocols and algorithms that pertain to the evaluation of patients at risk for COVID-19 are in a state of rapid change based on information released by regulatory bodies including the CDC and federal and state organizations. These policies and algorithms were followed during the patient's care in the ED.  Some ED evaluations and interventions may be delayed as a result of limited staffing during the pandemic.*      Medical Decision Making: 85 yo F here with generalized weakness. No focal neurological deficits. Telemetry shows frequent PVCs but EKG non-ischemic, trop negative. Doubt ACS. Concern for pre-syncope, possible arrhythmia. Less likely CVA. CT head is negative. Given age, h/o SVT/arrhythmia, will admit for obs. Labs otherwise reassuring.  ____________________________________________  FINAL CLINICAL IMPRESSION(S) / ED DIAGNOSES  Final diagnoses:  Weakness     MEDICATIONS GIVEN DURING THIS VISIT:  Medications  aspirin EC tablet 81 mg (has no administration in time range)  acetaminophen (TYLENOL) tablet 500 mg (has no administration in time range)  meloxicam (MOBIC) tablet 7.5 mg (has no administration in time range)  escitalopram  (LEXAPRO) tablet 10 mg (has no administration in time range)  LORazepam (ATIVAN) tablet 0.5 mg (has no administration in time range)  bismuth subsalicylate (PEPTO BISMOL) 262 MG/15ML suspension 30 mL (has no administration in time range)  docusate sodium (COLACE) capsule 100 mg (has no administration in time range)  pantoprazole (PROTONIX) EC tablet 40 mg (has no administration in time range)  multivitamin with minerals tablet 1 tablet (has no administration in time range)  omega-3 acid ethyl esters (LOVAZA) capsule 1 g (has no administration in time range)  loratadine (CLARITIN) tablet 10 mg (has no administration in time range)  montelukast (SINGULAIR) tablet 10 mg (has no administration in time range)  sodium chloride flush (NS) 0.9 % injection 3 mL (has no administration in time range)  enoxaparin (LOVENOX) injection 30 mg (has no administration in time range)  0.9 %  sodium chloride infusion (has no administration in time range)  hydroxypropyl methylcellulose / hypromellose (ISOPTO TEARS / GONIOVISC) 2.5 % ophthalmic solution 1 drop (has no administration in time range)     ED Discharge Orders    None       Note:  This document was prepared using Dragon voice recognition software and may include unintentional dictation errors.   Duffy Bruce, MD 03/02/19 [phone]

## 2019-03-03 ENCOUNTER — Inpatient Hospital Stay
Admit: 2019-03-03 | Discharge: 2019-03-03 | Disposition: A | Discharge status: Home / Self Care | Payer: Medicare Other | Attending: Nurse Practitioner | Admitting: Nurse Practitioner

## 2019-03-03 ENCOUNTER — Inpatient Hospital Stay: Payer: Medicare Other

## 2019-03-03 DIAGNOSIS — R42 Dizziness and giddiness: Secondary | ICD-10-CM

## 2019-03-03 DIAGNOSIS — R531 Weakness: Secondary | ICD-10-CM

## 2019-03-03 DIAGNOSIS — R55 Syncope and collapse: Secondary | ICD-10-CM

## 2019-03-03 LAB — ECHOCARDIOGRAM COMPLETE
Height: 66 in
Weight: 2603.19 oz

## 2019-03-03 LAB — GLUCOSE, CAPILLARY
Glucose-Capillary: 101 mg/dL — ABNORMAL HIGH (ref 70–99)
Glucose-Capillary: 86 mg/dL (ref 70–99)

## 2019-03-03 MED ORDER — ENSURE ENLIVE PO LIQD
237.0000 mL | Freq: Two times a day (BID) | ORAL | Status: DC
  Administered 2019-03-03: 237 mL via ORAL

## 2019-03-03 NOTE — Procedures (Addendum)
Patient Name: Erica Donovan  MRN: [id number]  Epilepsy Attending: Lora Havens  Referring Physician/Provider: Rufina Falco, NP Date: 03/03/2019 Duration: 23.09 mins  Patient history: 85yo F with weakness and near syncope. EEG to evaluate for seizure.  Level of alertness: awake, drowsy  Technical aspects: This EEG study was done with scalp electrodes positioned according to the 10-20 International system of electrode placement. Electrical activity was acquired at a sampling rate of 500Hz  and reviewed with a high frequency filter of 70Hz  and a low frequency filter of 1Hz . EEG data were recorded continuously and digitally stored.   DESCRIPTION:  The posterior dominant rhythm consists of 8 Hz activity of moderate voltage (25-35 uV) seen predominantly in posterior head regions, symmetric and reactive to eye opening and eye closing. Drowsiness was characterized by attenuation of the posterior background rhythm. There was intermittent left more than right temporal slowing. Photic driving was not seen during photic stimulation. Hyperventilation was not performed.  IMPRESSION: This study is suggestive of mild cortical dysfunction in left more than right temporal lobe, non specific to etiology. No seizures or epileptiform discharges were seen throughout the recording.   Gaetano Romberger Barbra Sarks

## 2019-03-03 NOTE — Progress Notes (Signed)
[facility] [place] NAME: Erica Donovan    MR#:  [id number]  DATE OF BIRTH:  Jan 29, 1926  SUBJECTIVE: 85 year old female patient came in because of generalized weakness, near syncopal episode, this morning she says he is feeling much better and wants to go home.  CHIEF COMPLAINT:   Chief Complaint  Patient presents with  . Weakness    REVIEW OF SYSTEMS:   ROS CONSTITUTIONAL: No fever, fatigue or weakness.  EYES: No blurred or double vision.  EARS, NOSE, AND THROAT: No tinnitus or ear pain.  RESPIRATORY: No cough, shortness of breath, wheezing or hemoptysis.  CARDIOVASCULAR: No chest pain, orthopnea, edema.  GASTROINTESTINAL: No nausea, vomiting, diarrhea or abdominal pain.  GENITOURINARY: No dysuria, hematuria.  ENDOCRINE: No polyuria, nocturia,  HEMATOLOGY: No anemia, easy bruising or bleeding SKIN: No rash or lesion. MUSCULOSKELETAL: No joint pain or arthritis.   NEUROLOGIC: No tingling, numbness, weakness.  PSYCHIATRY: No anxiety or depression.   DRUG ALLERGIES:   Allergies  Allergen Reactions  . Penicillins Nausea Only    Did it involve swelling of the face/tongue/throat, SOB, or low BP? Unknown Did it involve sudden or severe rash/hives, skin peeling, or any reaction on the inside of your mouth or nose? Unknown Did you need to seek medical attention at a hospital or doctor's office? Unknown When did it last happen?Many years ago. If all above answers are "NO", may proceed with cephalosporin use.   . Ciprofloxacin Nausea Only  . Nitrofurantoin Itching and Rash    VITALS:  Blood pressure (!) 151/69, pulse 85, temperature (!) 97.5 F (36.4 C), temperature source Oral, resp. rate 16, height 5\' 6"  (1.676 m), weight 73.8 kg, SpO2 98 %.  PHYSICAL EXAMINATION:  GENERAL:  85 y.o.-year-old patient lying in the bed with no acute distress.  EYES: Pupils equal, round, reactive to light and accommodation. No scleral  icterus. Extraocular muscles intact.  HEENT: Head atraumatic, normocephalic. Oropharynx and nasopharynx clear.  NECK:  Supple, no jugular venous distention. No thyroid enlargement, no tenderness.  LUNGS: Normal breath sounds bilaterally, no wheezing, rales,rhonchi or crepitation. No use of accessory muscles of respiration.  CARDIOVASCULAR: S1, S2 normal. No murmurs, rubs, or gallops.  ABDOMEN: Soft, nontender, nondistended. Bowel sounds present. No organomegaly or mass.  EXTREMITIES: No pedal edema, cyanosis, or clubbing.  NEUROLOGIC: Cranial nerves II through XII are intact. Muscle strength 5/5 in all extremities. Sensation intact. Gait not checked.  PSYCHIATRIC: The patient is alert and oriented x 3.  SKIN: No obvious rash, lesion, or ulcer.    LABORATORY PANEL:   CBC Recent Labs  Lab 03/02/19 0925  WBC 6.6  HGB 12.9  HCT 38.5  PLT 217   ------------------------------------------------------------------------------------------------------------------  Chemistries  Recent Labs  Lab 03/02/19 0925  NA 139  K 4.6  CL 101  CO2 30  GLUCOSE 109*  BUN 30*  CREATININE 1.25*  CALCIUM 9.9   ------------------------------------------------------------------------------------------------------------------  Cardiac Enzymes No results for input(s): TROPONINI in the last 168 hours. ------------------------------------------------------------------------------------------------------------------  RADIOLOGY:  Dg Chest 2 View  Result Date: 03/02/2019 CLINICAL DATA:  Shortness of breath, feeling funny, weakness, history COPD, dementia, hypertension EXAM: CHEST - 2 VIEW COMPARISON:  09/29/2015 FINDINGS: Enlargement of cardiac silhouette. Mediastinal contours and pulmonary vascularity normal. Atherosclerotic calcification aorta. Lungs hyperinflated but clear. No pulmonary infiltrate, pleural effusion or pneumothorax. Bones demineralized. IMPRESSION: Hyperinflated lungs without acute  infiltrate. Enlargement of cardiac silhouette. Electronically Signed   By: Crist Infante.D.  On: 03/02/2019 13:04   Ct Head Wo Contrast  Result Date: 03/02/2019 CLINICAL DATA:  Altered mental status EXAM: CT HEAD WITHOUT CONTRAST TECHNIQUE: Contiguous axial images were obtained from the base of the skull through the vertex without intravenous contrast. COMPARISON:  CT dated 03/16/2018. FINDINGS: Brain: No evidence of acute infarction, hemorrhage, hydrocephalus, extra-axial collection or mass lesion/mass effect. Age related atrophy is noted. Vascular: No hyperdense vessel or unexpected calcification. Skull: Normal. Negative for fracture or focal lesion. Sinuses/Orbits: No acute finding. Other: None. IMPRESSION: No acute intracranial abnormality. Electronically Signed   By: Constance Holster M.D.   On: 03/02/2019 15:06    EKG:   Orders placed or performed during the hospital encounter of 03/02/19  . EKG 12-Lead  . EKG 12-Lead  . ED EKG  . ED EKG    ASSESSMENT AND PLAN:   85 year old female with generalized weakness, near syncope: Admitted to telemetry for evaluation of cardiac arrhythmia/stroke: Patient initial CT head unremarkable, MRI of the brain negative for acute stroke, had no further arrhythmias, ultrasound of carotids pending, echocardiogram showed EF 60 to 65% with normal cavity size.,  EEG ordered by admitting doctor, pending results,  #2 acute kidney injury: Improved with IV hydration, patient feels better today, #3/ diabetes mellitus type 2 without complications diet controlled. For anxiety, depression, continue Lexapro, Ativan.   Weakness, physical therapy consult. All the records are reviewed and case discussed with Care Management/Social Workerr. Management plans discussed with the patient, family and they are in agreement.  CODE STATUS: Full code  TOTAL TIME TAKING CARE OF THIS PATIENT: 35 minutes.   POSSIBLE D/C IN 1-2 DAYS, DEPENDING ON CLINICAL  CONDITION.   [name] M.D on 03/03/2019 at 1:06 PM  Between 7am to 6pm - Pager - [phone]  After 6pm go to [email or web address] - password [email or web address] [facility]  Office  [phone]  CC: Primary care physician; Juluis Pitch, MD   Note: This dictation was prepared with Dragon dictation along with smaller phrase technology. Any transcriptional errors that result from this process are unintentional.

## 2019-03-03 NOTE — Progress Notes (Signed)
eeg completed ° °

## 2019-03-03 NOTE — Plan of Care (Signed)
  Problem: Education: Goal: Knowledge of General Education information will improve Description: Including pain rating scale, medication(s)/side effects and non-pharmacologic comfort measures Outcome: Progressing   Problem: Clinical Measurements: Goal: Will remain free from infection Outcome: Progressing Goal: Respiratory complications will improve Outcome: Progressing   Problem: Skin Integrity: Goal: Risk for impaired skin integrity will decrease Outcome: Progressing   

## 2019-03-03 NOTE — Progress Notes (Signed)
Initial Nutrition Assessment  DOCUMENTATION CODES:   Not applicable  INTERVENTION:   Ensure Enlive po BID, each supplement provides 350 kcal and 20 grams of protein  Liberalize diet   NUTRITION DIAGNOSIS:   Inadequate oral intake related to acute illness as evidenced by per patient/family report  GOAL:   Patient will meet greater than or equal to 90% of their needs  MONITOR:   PO intake, Supplement acceptance, Labs, Weight trends, Skin, I & O's  REASON FOR ASSESSMENT:   Malnutrition Screening Tool    ASSESSMENT:   85 year old female with past medical history of skin cancer, mild COPD, dementia, depression, diabetes mellitus without complication, hyperlipidemia, hypertension, GERD, anxiety and depression, paroxysmal SVT, CKD, anemia, and hyponatremia presenting to the ED with near syncopal episode.  RD working remotely.  Pt with decreased appetite and oral intake for several weeks pta r/t weakness. RD will add supplements to help pt meet her estimated needs. RD will also liberalize pt's diet. Per chart, pt is weight stable pta.   Medications reviewed and include: aspirin, colace, lovenox, MVI, omega-3, protonix, NaCl @75ml /hr  Labs reviewed: BUN 30(H), creat 1.25(H)  Unable to complete Nutrition-Focused physical exam at this time.   Diet Order:   Diet Order            Diet regular Room service appropriate? Yes; Fluid consistency: Thin  Diet effective now             EDUCATION NEEDS:   No education needs have been identified at this time  Skin:  Skin Assessment: Reviewed RN Assessment(ecchymosis)  Last BM:  9/1- TYPE 3  Height:   Ht Readings from Last 1 Encounters:  03/02/19 5\' 6"  (1.676 m)    Weight:   Wt Readings from Last 1 Encounters:  03/03/19 73.8 kg    Ideal Body Weight:  59 kg  BMI:  Body mass index is 26.26 kg/m.  Estimated Nutritional Needs:   Kcal:  1400-1600kcal/day  Protein:  70-80g/day  Fluid:  >1.4L/day  Koleen Distance  MS, RD, LDN Pager #- [phone] Office#- [phone] After Hours Pager: [phone]

## 2019-03-03 NOTE — Progress Notes (Signed)
Ch visited pt to complete an OR. Pt is a 85 y.o. female that does not present to be her age of 84. Pt is very pleasant, lucid, and alert. Ch provided space for pt to give a life review and the challenges that she had being a [place] wife and raising children while living in various places as her husband (who passed over 10 years ago) served in the [employer]. Pt had daughter at bedside who is one of the few members left in the pt's immediate family. Ch asked guided questions regarding the pt's beliefs and current events. Pt shared that she has never experienced being separated from the church or fellowship with others during her life time. Ch provided words of encouragement and offered prayer at bedside with pt daughter. Ch provided pt with a Bible and devotional pamphlet which she appreciated.  F/U care encouraged.    03/03/19 1400  Clinical Encounter Type  Visited With Patient and family together  Visit Type Spiritual support;Social support;Psychological support  Referral From Physician  Consult/Referral To Chaplain  Spiritual Encounters  Spiritual Needs Emotional;Grief support;Prayer;Brochure;Sacred text  Stress Factors  Patient Stress Factors Health changes;Major life changes;Loss of control  Family Stress Factors Major life changes

## 2019-03-03 NOTE — Plan of Care (Signed)
  Problem: Education: Goal: Knowledge of General Education information will improve Description: Including pain rating scale, medication(s)/side effects and non-pharmacologic comfort measures Outcome: Progressing   Problem: Safety: Goal: Ability to remain free from injury will improve Outcome: Progressing   Problem: Skin Integrity: Goal: Risk for impaired skin integrity will decrease Outcome: Progressing   

## 2019-03-03 NOTE — Progress Notes (Signed)
Spoke to patient's daughter over the phone and explained the results of ultrasound of carotids, echocardiogram.  Told her the EEG is just done and waiting for the results, possible discharge tomorrow if daughter feels safe if she goes home. Tomorrow. Patient lives alone

## 2019-03-03 NOTE — Progress Notes (Signed)
*  PRELIMINARY RESULTS* Echocardiogram 2D Echocardiogram has been performed.  Erica Donovan 03/03/2019, 11:05 AM

## 2019-03-04 LAB — GLUCOSE, CAPILLARY
Glucose-Capillary: 127 mg/dL — ABNORMAL HIGH (ref 70–99)
Glucose-Capillary: 122 mg/dL — ABNORMAL HIGH (ref 70–99)

## 2019-03-04 MED ORDER — ENSURE ENLIVE PO LIQD: 237.0000 mL | Freq: Two times a day (BID) | ORAL | 12 refills | Status: DC

## 2019-03-04 NOTE — Discharge Summary (Signed)
Erica Donovan, is a 85 y.o. female  DOB 11/04/1925  MRN [id number].  Admission date:  03/02/2019  Admitting Physician  Lang Snow, NP  Discharge Date:  03/04/2019   Primary MD  Juluis Pitch, MD  Recommendations for primary care physician for things to follow:   Follow-up with PCP in 1 week   Admission Diagnosis  Weakness [R53.1] Postural dizziness with near syncope [R42, R55]   Discharge Diagnosis  Weakness [R53.1] Postural dizziness with near syncope [R42, R55]    Active Problems:   Postural dizziness with near syncope      Past Medical History:  Diagnosis Date  . Arthritis   . Cancer ([place])    skin cancer  . COPD (chronic obstructive pulmonary disease) (H[facility])    mild- no inhalers  . Dementia ([place])   . Depression   . Diabetes mellitus without complication ([place])   . 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 GERD (gastroesophageal reflux disease)   . Hemorrhoids   . History of kidney stones   . Hyperlipidemia   . Hypertension   . Leaky heart valve   . Paroxysmal SVT (supraventricular tachycardia) (H[facility])     Past Surgical History:  Procedure Laterality Date  . ABDOMINAL HYSTERECTOMY    . APPENDECTOMY  1945  . EYE SURGERY     cataract bil  . HEMORRHOID SURGERY    . JOINT REPLACEMENT Left 2008  . ORIF WRIST FRACTURE Right 11/06/2018   Procedure: OPEN REDUCTION INTERNAL FIXATION (ORIF) WRIST FRACTURE RIGHT - DIABETIC, SLEEP APNEA;  Surgeon: Hessie Knows, MD;  Location: [facility] ORS;  Service: Orthopedics;  Laterality: Right;       History of present illness and  Hospital Course:     Kindly see H&P for history of present illness and admission details, please review complete Labs, Consult reports and Test reports for all details in  brief  HPI  from the history and physical done on the day of admission 85 year old female with history of diabetes mellitus type 2, anxiety, depression admitted because of generalized weakness, near syncope.    Hospital Course  Near syncope patient had CT of the head that was unremarkable, patient is admitted for to telemetry for evaluation of cardiac arrhythmias.  Echocardiogram showed EF more than 55%.  Patient also had a EEG which is for acute seizures.  Patient received IV fluids for dehydration, syncope work-up is negative patient had a near syncopal episode secondary to dehydration rather than acute cardiac or neurological event.  Discussed this with patient's daughter.  Discharge home with home health.   Discharge Condition: Stable  Follow UP      Discharge Instructions  and  Discharge Medications     Allergies as of 03/04/2019      Reactions   Penicillins Nausea Only   Did it involve swelling of the face/tongue/throat, SOB, or low BP? Unknown Did it involve sudden or severe rash/hives, skin peeling, or any reaction on the inside of your mouth or nose? Unknown Did you need to seek medical attention at a hospital or doctor's office? Unknown When did it last happen?Many years ago. If all above answers are "NO", may proceed with cephalosporin use.   Ciprofloxacin Nausea Only   Nitrofurantoin Itching, Rash      Medication List    TAKE these medications   acetaminophen 500 MG tablet Commonly known as: TYLENOL Take 500-1,000 mg by mouth  every 6 (six) hours as needed for mild pain.   aspirin EC 81 MG tablet Take 81 mg by mouth daily.   bismuth subsalicylate [id number] [id number] suspension Commonly known as: PEPTO BISMOL Take 30 mLs by mouth every 6 (six) hours as needed.   carboxymethylcellulose 0.5 % Soln Commonly known as: REFRESH PLUS Place 1 drop into both eyes 3 (three) times daily as needed (dry eyes).   cetirizine 10 MG tablet Commonly known as: ZYRTEC Take 10 mg  by mouth daily.   diphenhydrAMINE 25 MG tablet Commonly known as: BENADRYL Take 25 mg by mouth every 8 (eight) hours as needed for itching.   docusate sodium 100 MG capsule Commonly known as: COLACE Take 100 mg by mouth daily at 3 pm. And prn   escitalopram 10 MG tablet Commonly known as: LEXAPRO Take 10 mg by mouth every morning.   EX-LAX PO Take by mouth as needed.   feeding supplement (ENSURE ENLIVE) Liqd Take 237 mLs by mouth 2 (two) times daily between meals.   Fish Oil 1000 MG Caps Take 1,000 mg by mouth 3 (three) times daily.   HYDROcodone-acetaminophen 5-325 MG tablet Commonly known as: Norco Take 1 tablet by mouth every 6 (six) hours as needed.   loratadine 10 MG tablet Commonly known as: CLARITIN Take 10 mg by mouth every morning.   LORazepam 0.5 MG tablet Commonly known as: ATIVAN Take 0.5 mg by mouth 2 (two) times daily.   meloxicam 7.5 MG tablet Commonly known as: MOBIC Take 7.5 mg by mouth as needed for pain.   montelukast 10 MG tablet Commonly known as: SINGULAIR Take 10 mg by mouth at bedtime.   multivitamin with minerals Tabs tablet Take 1 tablet by mouth daily.   omeprazole 40 MG capsule Commonly known as: PRILOSEC Take 40 mg by mouth 2 (two) times a day.   vitamin C 500 MG tablet Commonly known as: ASCORBIC ACID Take 500 mg by mouth as needed.         Diet and Activity recommendation: See Discharge Instructions above   Consults obtained -none   Major procedures and Radiology Reports - PLEASE review detailed and final reports for all details, in brief -      Dg Chest 2 View  Result Date: 03/02/2019 CLINICAL DATA:  Shortness of breath, feeling funny, weakness, history COPD, dementia, hypertension EXAM: CHEST - 2 VIEW COMPARISON:  09/29/2015 FINDINGS: Enlargement of cardiac silhouette. Mediastinal contours and pulmonary vascularity normal. Atherosclerotic calcification aorta. Lungs hyperinflated but clear. No pulmonary  infiltrate, pleural effusion or pneumothorax. Bones demineralized. IMPRESSION: Hyperinflated lungs without acute infiltrate. Enlargement of cardiac silhouette. Electronically Signed   By: Lavonia Dana M.D.   On: 03/02/2019 13:04   Ct Head Wo Contrast  Result Date: 03/02/2019 CLINICAL DATA:  Altered mental status EXAM: CT HEAD WITHOUT CONTRAST TECHNIQUE: Contiguous axial images were obtained from the base of the skull through the vertex without intravenous contrast. COMPARISON:  CT dated 03/16/2018. FINDINGS: Brain: No evidence of acute infarction, hemorrhage, hydrocephalus, extra-axial collection or mass lesion/mass effect. Age related atrophy is noted. Vascular: No hyperdense vessel or unexpected calcification. Skull: Normal. Negative for fracture or focal lesion. Sinuses/Orbits: No acute finding. Other: None. IMPRESSION: No acute intracranial abnormality. Electronically Signed   By: Constance Holster M.D.   On: 03/02/2019 15:06   US Carotid Bilateral  Result Date: 03/03/2019 CLINICAL DATA:  Postural dizziness, near syncope. EXAM: BILATERAL CAROTID DUPLEX ULTRASOUND TECHNIQUE: Pearline Cables scale imaging, color Doppler and duplex ultrasound were performed  of bilateral carotid and vertebral arteries in the neck. COMPARISON:  None. FINDINGS: Criteria: Quantification of carotid stenosis is based on velocity parameters that correlate the residual internal carotid diameter with NASCET-based stenosis levels, using the diameter of the distal internal carotid lumen as the denominator for stenosis measurement. The following velocity measurements were obtained: RIGHT ICA: 83/26 cm/sec CCA: [id number] cm/sec SYSTOLIC ICA/CCA RATIO:  1.3 ECA: 80 cm/sec LEFT ICA: 81/25 cm/sec CCA: [id number] cm/sec SYSTOLIC ICA/CCA RATIO:  1.2 ECA: 63 cm/sec RIGHT CAROTID ARTERY: Right carotid arteries are patent without significant plaque or stenosis. External carotid artery is patent with normal waveform. Normal waveforms and velocities in the internal  carotid artery. RIGHT VERTEBRAL ARTERY: Antegrade flow and normal waveform in the right vertebral artery. LEFT CAROTID ARTERY: Left carotid arteries are patent without significant plaque or stenosis. External carotid artery is patent with normal waveform. Normal waveforms and velocities in the internal carotid artery. LEFT VERTEBRAL ARTERY: Antegrade flow and normal waveform in the left vertebral artery. IMPRESSION: Normal carotid artery duplex examination. Carotid arteries are patent without significant plaque or stenosis. Patent vertebral arteries with antegrade flow. Electronically Signed   By: Markus Daft M.D.   On: 03/03/2019 14:58    Micro Results     Recent Results (from the past 240 hour(s))  SARS Coronavirus 2 [facility] order, Performed in [facility] hospital lab)     Status: None   Collection Time: 03/02/19  3:45 PM  Result Value Ref Range Status   SARS Coronavirus 2 NEGATIVE NEGATIVE Final    Comment: (NOTE) If result is NEGATIVE SARS-CoV-2 target nucleic acids are NOT DETECTED. The SARS-CoV-2 RNA is generally detectable in upper and lower  respiratory specimens during the acute phase of infection. The lowest  concentration of SARS-CoV-2 viral copies this assay can detect is 250  copies / mL. A negative result does not preclude SARS-CoV-2 infection  and should not be used as the sole basis for treatment or other  patient management decisions.  A negative result may occur with  improper specimen collection / handling, submission of specimen other  than nasopharyngeal swab, presence of viral mutation(s) within the  areas targeted by this assay, and inadequate number of viral copies  (<250 copies / mL). A negative result must be combined with clinical  observations, patient history, and epidemiological information. If result is POSITIVE SARS-CoV-2 target nucleic acids are DETECTED. The SARS-CoV-2 RNA is generally detectable in upper and lower  respiratory specimens dur ing the  acute phase of infection.  Positive  results are indicative of active infection with SARS-CoV-2.  Clinical  correlation with patient history and other diagnostic information is  necessary to determine patient infection status.  Positive results do  not rule out bacterial infection or co-infection with other viruses. If result is PRESUMPTIVE POSTIVE SARS-CoV-2 nucleic acids MAY BE PRESENT.   A presumptive positive result was obtained on the submitted specimen  and confirmed on repeat testing.  While 2019 novel coronavirus  (SARS-CoV-2) nucleic acids may be present in the submitted sample  additional confirmatory testing may be necessary for epidemiological  and / or clinical management purposes  to differentiate between  SARS-CoV-2 and other Sarbecovirus currently known to infect humans.  If clinically indicated additional testing with an alternate test  methodology [phone]) is advised. The SARS-CoV-2 RNA is generally  detectable in upper and lower respiratory sp ecimens during the acute  phase of infection. The expected result is Negative. Fact Sheet for Patients:  StrictlyIdeas.no Fact  Sheet for Healthcare Providers: BankingDealers.co.za This test is not yet approved or cleared by the [place] and has been authorized for detection and/or diagnosis of SARS-CoV-2 by FDA under an Emergency Use Authorization (EUA).  This EUA will remain in effect (meaning this test can be used) for the duration of the COVID-19 declaration under Section 564(b)(1) of the Act, 21 U.S.C. section 360bbb-3(b)(1), unless the authorization is terminated or revoked sooner. Performed at [facility], [address] [place], [place] [address]        Today   Subjective:   Erica Donovan today has no headache,no chest abdominal pain,no new weakness tingling or numbness, feels much better wants to go home today.   Objective:   Blood  pressure (!) 154/73, pulse 87, temperature 98 F (36.7 C), temperature source Oral, resp. rate 18, height 5\' 6"  (1.676 m), weight 71.7 kg, SpO2 96 %.   Intake/Output Summary (Last 24 hours) at 03/04/2019 1437 Last data filed at 03/04/2019 1019 Gross per 24 hour  Intake 243 ml  Output 0 ml  Net 243 ml    Exam Awake Alert, Oriented x 3, No new F.N deficits, Normal affect [place].AT,PERRAL Supple Neck,No JVD, No cervical lymphadenopathy appriciated.  Symmetrical Chest wall movement, Good air movement bilaterally, CTAB RRR,No Gallops,Rubs or new Murmurs, No Parasternal Heave +ve B.Sounds, Abd Soft, Non tender, No organomegaly appriciated, No rebound -guarding or rigidity. No Cyanosis, Clubbing or edema, No new Rash or bruise  Data Review   CBC w Diff:  Lab Results  Component Value Date   WBC 6.6 03/02/2019   HGB 12.9 03/02/2019   HGB 13.2 03/02/2013   HCT 38.5 03/02/2019   HCT 38.7 03/02/2013   PLT 217 03/02/2019   PLT 450 (H) 03/02/2013    CMP:  Lab Results  Component Value Date   NA 139 03/02/2019   NA 134 (L) 03/02/2013   K 4.6 03/02/2019   K 4.5 03/02/2013   CL 101 03/02/2019   CL 102 03/02/2013   CO2 30 03/02/2019   CO2 26 03/02/2013   BUN 30 (H) 03/02/2019   BUN 15 03/02/2013   CREATININE 1.25 (H) 03/02/2019   CREATININE 1.22 03/02/2013   PROT 6.5 03/02/2013   ALBUMIN 3.5 03/02/2013   BILITOT 0.4 03/02/2013   ALKPHOS 101 03/02/2013   AST 27 03/02/2013   ALT 30 03/02/2013  .   Total Time in preparing paper work, data evaluation and todays exam - 40 minutes  [name] M.D on 03/04/2019 at 2:37 PM    Note: This dictation was prepared with Dragon dictation along with smaller phrase technology. Any transcriptional errors that result from this process are unintentional.

## 2019-03-04 NOTE — Progress Notes (Signed)
Pt discharged home with [employer] via private car at 1255. Daughter at bedside. Pt was A&Ox4. VSS. AVS reviewed with pt and daughter and all questions were answered. Pt left with clothes, shoes, and glasses. Pt wheeled downstairs by this RN.

## 2019-03-04 NOTE — Progress Notes (Signed)
Stable for discharge home, patient EEG is unremarkable, echocardiogram showed EF more than 55%, ultrasound of carotids are minimal carotid plaque.  Discussed with patient's daughter that patient is stable for discharge, she told me that she was getting a [facility] home [facility] for history of.  Wrist fracture and she was getting until 3 weeks ago, requested home health physical therapy before she goes home as she lives alone.  Patient is stable for discharge.

## 2019-10-14 ENCOUNTER — Emergency Department
Admission: EM | Admit: 2019-10-14 | Discharge: 2019-10-15 | Disposition: A | Discharge status: Home / Self Care | Payer: Medicare Other | Attending: Emergency Medicine | Admitting: Emergency Medicine

## 2019-10-14 ENCOUNTER — Emergency Department: Payer: Medicare Other

## 2019-10-14 ENCOUNTER — Other Ambulatory Visit: Payer: Self-pay

## 2019-10-14 DIAGNOSIS — J449 Chronic obstructive pulmonary disease, unspecified: Secondary | ICD-10-CM | POA: Insufficient documentation

## 2019-10-14 DIAGNOSIS — F0390 Unspecified dementia without behavioral disturbance: Secondary | ICD-10-CM | POA: Insufficient documentation

## 2019-10-14 DIAGNOSIS — Z79899 Other long term (current) drug therapy: Secondary | ICD-10-CM | POA: Diagnosis not present

## 2019-10-14 DIAGNOSIS — I10 Essential (primary) hypertension: Secondary | ICD-10-CM | POA: Insufficient documentation

## 2019-10-14 DIAGNOSIS — R630 Anorexia: Secondary | ICD-10-CM | POA: Diagnosis not present

## 2019-10-14 DIAGNOSIS — Z87891 Personal history of nicotine dependence: Secondary | ICD-10-CM | POA: Diagnosis not present

## 2019-10-14 DIAGNOSIS — E119 Type 2 diabetes mellitus without complications: Secondary | ICD-10-CM | POA: Diagnosis not present

## 2019-10-14 DIAGNOSIS — R1011 Right upper quadrant pain: Secondary | ICD-10-CM | POA: Diagnosis present

## 2019-10-14 DIAGNOSIS — Z85828 Personal history of other malignant neoplasm of skin: Secondary | ICD-10-CM | POA: Diagnosis not present

## 2019-10-14 DIAGNOSIS — K5900 Constipation, unspecified: Secondary | ICD-10-CM | POA: Diagnosis not present

## 2019-10-14 DIAGNOSIS — Z7982 Long term (current) use of aspirin: Secondary | ICD-10-CM | POA: Insufficient documentation

## 2019-10-14 DIAGNOSIS — Z9660 Presence of unspecified orthopedic joint implant: Secondary | ICD-10-CM | POA: Insufficient documentation

## 2019-10-14 DIAGNOSIS — R1084 Generalized abdominal pain: Secondary | ICD-10-CM

## 2019-10-14 LAB — URINALYSIS, COMPLETE (UACMP) WITH MICROSCOPIC
Bacteria, UA: NONE SEEN
Bilirubin Urine: NEGATIVE
Glucose, UA: NEGATIVE mg/dL
Hgb urine dipstick: NEGATIVE
Ketones, ur: NEGATIVE mg/dL
Nitrite: NEGATIVE
Protein, ur: NEGATIVE mg/dL
Specific Gravity, Urine: 1.013 (ref 1.005–1.030)
pH: 5 (ref 5.0–8.0)

## 2019-10-14 LAB — COMPREHENSIVE METABOLIC PANEL
ALT: 12 U/L (ref 0–44)
AST: 16 U/L (ref 15–41)
Albumin: 3.8 g/dL (ref 3.5–5.0)
Alkaline Phosphatase: 47 U/L (ref 38–126)
Anion gap: 8 (ref 5–15)
BUN: 26 mg/dL — ABNORMAL HIGH (ref 8–23)
CO2: 28 mmol/L (ref 22–32)
Calcium: 10.2 mg/dL (ref 8.9–10.3)
Chloride: 102 mmol/L (ref 98–111)
Creatinine, Ser: 1.1 mg/dL — ABNORMAL HIGH (ref 0.44–1.00)
GFR calc Af Amer: 51 mL/min — ABNORMAL LOW (ref >60)
GFR calc non Af Amer: 44 mL/min — ABNORMAL LOW (ref >60)
Glucose, Bld: 125 mg/dL — ABNORMAL HIGH (ref 70–99)
Potassium: 4.3 mmol/L (ref 3.5–5.1)
Sodium: 138 mmol/L (ref 135–145)
Total Bilirubin: 0.6 mg/dL (ref 0.3–1.2)
Total Protein: 6.4 g/dL — ABNORMAL LOW (ref 6.5–8.1)

## 2019-10-14 LAB — CBC
HCT: 36.4 % (ref 36.0–46.0)
Hemoglobin: 12.4 g/dL (ref 12.0–15.0)
MCH: 32.7 pg (ref 26.0–34.0)
MCHC: 34.1 g/dL (ref 30.0–36.0)
MCV: 96 fL (ref 80.0–100.0)
Platelets: 221 10*3/uL (ref 150–400)
RBC: 3.79 MIL/uL — ABNORMAL LOW (ref 3.87–5.11)
RDW: 12.7 % (ref 11.5–15.5)
WBC: 8 10*3/uL (ref 4.0–10.5)
nRBC: 0 % (ref 0.0–0.2)

## 2019-10-14 LAB — LIPASE, BLOOD: Lipase: 37 U/L (ref 11–51)

## 2019-10-14 MED ORDER — IOHEXOL 300 MG/ML  SOLN
75.0000 mL | Freq: Once | INTRAMUSCULAR | Status: AC | PRN
  Administered 2019-10-15: 75 mL via INTRAVENOUS

## 2019-10-14 MED ORDER — SODIUM CHLORIDE 0.9 % IV BOLUS
500.0000 mL | Freq: Once | INTRAVENOUS | Status: AC
  Administered 2019-10-15: 500 mL via INTRAVENOUS

## 2019-10-14 NOTE — ED Notes (Signed)
Pt up to restroom. Hat and sterile cup provided for sample.

## 2019-10-14 NOTE — ED Triage Notes (Signed)
Pt comes POV after seeing walk in clinic. Abdominal pain for 3 days. Hurts with palpation on right side. Pt does not have appendix. Pt had issues with constipation last week and is concerned about same. MD concerned about gallbladder. MD also felt a "mass" in her lower abdominal area.

## 2019-10-14 NOTE — ED Notes (Signed)
This RN attempted Iv access twice without success, second RN called.

## 2019-10-14 NOTE — ED Provider Notes (Signed)
[facility] Emergency Department Provider Note  ____________________________________________   First MD Initiated Contact with Patient 10/14/19 2308     (approximate)  I have reviewed the triage vital signs and the nursing notes.   HISTORY  Chief Complaint Abdominal Pain    HPI Erica Donovan is a 86 y.o. female with medical history as listed below who presents for evaluation of about 3 days of mild to moderate waxing and waning generalized abdominal pain.  It is been aching throughout her abdomen but has been worse in the upper part of the abdomen including the right upper quadrant.  However she also has a palpable mass in the middle of the lower part of her abdomen near her old surgical site from her hysterectomy.  She is concerned about being constipated; she deals with chronic constipation and last had a bowel movement about 5 days ago, but has taken some other over-the-counter medication to have another bowel movement but has been unsuccessful.  She has had decreased oral intake and appetite, but denies nausea and vomiting.  She has had an appendectomy but no cholecystectomy.  She denies fever, sore throat, chest pain, shortness of breath, cough, and dysuria.  She went to [facility] clinic walk-in earlier today and that doctor recommended that she come to the emergency department for additional evaluation.         Past Medical History:  Diagnosis Date  . Arthritis   . Cancer ([place])    skin cancer  . COPD (chronic obstructive pulmonary disease) (HCC)    mild- no inhalers  . Dementia ([place])   . Depression   . Diabetes mellitus without complication ([place])   . 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 GERD (gastroesophageal reflux disease)   . Hemorrhoids   . History of kidney stones   . Hyperlipidemia   . Hypertension   . Leaky heart valve   . Paroxysmal SVT (supraventricular tachycardia)  [facility])     Patient Active Problem List   Diagnosis Date Noted  . Postural dizziness with near syncope 03/02/2019    Past Surgical History:  Procedure Laterality Date  . ABDOMINAL HYSTERECTOMY    . APPENDECTOMY  1945  . EYE SURGERY     cataract bil  . HEMORRHOID SURGERY    . JOINT REPLACEMENT Left 2008  . ORIF WRIST FRACTURE Right 11/06/2018   Procedure: OPEN REDUCTION INTERNAL FIXATION (ORIF) WRIST FRACTURE RIGHT - DIABETIC, SLEEP APNEA;  Surgeon: Hessie Knows, MD;  Location: [facility] ORS;  Service: Orthopedics;  Laterality: Right;    Prior to Admission medications   Medication Sig Start Date End Date Taking? Authorizing Provider  acetaminophen (TYLENOL) 500 MG tablet Take 500-1,000 mg by mouth every 6 (six) hours as needed for mild pain.    [provider]  aspirin EC 81 MG tablet Take 81 mg by mouth daily.    [provider]  bismuth subsalicylate (PEPTO BISMOL) 262 MG/15ML suspension Take 30 mLs by mouth every 6 (six) hours as needed for indigestion or diarrhea or loose stools.     [provider]  carboxymethylcellulose (REFRESH PLUS) 0.5 % SOLN Place 1 drop into both eyes 3 (three) times daily as needed (dry eyes).    [provider]  cetirizine (ZYRTEC) 10 MG tablet Take 10 mg by mouth daily as needed for allergies.     [provider]  diphenhydrAMINE (BENADRYL) 25 MG tablet  Take 25 mg by mouth every 8 (eight) hours as needed for itching.    [provider]  docusate sodium (COLACE) 100 MG capsule Take 100 mg by mouth daily at 3 pm. And prn    [provider]  escitalopram (LEXAPRO) 10 MG tablet Take 10 mg by mouth every morning.  10/27/18   [provider]  feeding supplement, ENSURE ENLIVE, (ENSURE ENLIVE) LIQD Take 237 mLs by mouth 2 (two) times daily between meals. 03/04/19   [name], MD  HYDROcodone-acetaminophen (NORCO) 5-325 MG tablet Take 1 tablet by mouth every 6 (six) hours as needed. Patient  not taking: Reported on 03/02/2019 11/06/18   Hessie Knows, MD  loratadine (CLARITIN) 10 MG tablet Take 10 mg by mouth every morning.    [provider]  LORazepam (ATIVAN) 0.5 MG tablet Take 0.5 mg by mouth 2 (two) times daily as needed for anxiety.     [provider]  meloxicam (MOBIC) 7.5 MG tablet Take 7.5 mg by mouth as needed for pain.    [provider]  montelukast (SINGULAIR) 10 MG tablet Take 10 mg by mouth at bedtime. 01/07/19 01/07/20  [provider]  Multiple Vitamin (MULTIVITAMIN WITH MINERALS) TABS tablet Take 1 tablet by mouth daily.    [provider]  Omega-3 Fatty Acids (FISH OIL) 1000 MG CAPS Take 1,000 mg by mouth 3 (three) times daily.    [provider]  omeprazole (PRILOSEC) 40 MG capsule Take 40 mg by mouth 2 (two) times a day. 08/07/18   [provider]  Sennosides (EX-LAX PO) Take by mouth as needed.    [provider]  vitamin C (ASCORBIC ACID) 500 MG tablet Take 500 mg by mouth as needed.    [provider]    Allergies Penicillins, Ciprofloxacin, and Nitrofurantoin  History reviewed. No pertinent family history.  Social History Social History   Tobacco Use  . Smoking status: Former Smoker    Types: Cigarettes    Quit date: 11/04/1973    Years since quitting: 45.9  . Smokeless tobacco: Never Used  Substance Use Topics  . Alcohol use: Never  . Drug use: Never    Review of Systems Constitutional: No fever/chills Eyes: No visual changes. ENT: No sore throat. Cardiovascular: Denies chest pain. Respiratory: Denies shortness of breath. Gastrointestinal: Abdominal pain in general but worse in the right upper quadrant but with palpable pelvic mass.  Decreased appetite with constipation but no nausea nor vomiting. Genitourinary: Negative for dysuria. Musculoskeletal: Negative for neck pain.  Negative for back pain. Integumentary: Negative for rash. Neurological: Negative for  headaches, focal weakness or numbness.   ____________________________________________   PHYSICAL EXAM:  VITAL SIGNS: ED Triage Vitals [10/14/19 1727]  Enc Vitals Group     BP 124/83     Pulse Rate 78     Resp 18     Temp 98.3 F (36.8 C)     Temp Source Oral     SpO2 99 %     Weight 70.3 kg (155 lb)     Height 1.6 m (5\' 3" )     Head Circumference      Peak Flow      Pain Score 8     Pain Loc      Pain Edu?      Excl. in [place]?     Constitutional: Alert and oriented.  Appears generally well for her age.  Daughter is at bedside. Eyes: Conjunctivae are normal.  Head:  Atraumatic. Nose: No congestion/rhinnorhea. Mouth/Throat: Patient is wearing a mask. Neck: No stridor.  No meningeal signs.   Cardiovascular: Normal rate, regular rhythm. Good peripheral circulation. Grossly normal heart sounds. Respiratory: Normal respiratory effort.  No retractions. Gastrointestinal: Soft and nondistended.  The patient has a palpable mass in the suprapubic region near the area of scar tissue from her hysterectomy, and she is tender to palpation in this area but only mildly so.  It is difficult to appreciate whether this mass is superficial in the tissues (as scar tissue would be) or if it is a deeper mass, particularly because the patient guards a bit when I am palpating.  Additionally she has some mild tenderness to palpation of the epigastrium and moderate tenderness in the right upper quadrant with equivocal Murphy sign. Genitourinary: Deferred Musculoskeletal: No lower extremity tenderness nor edema. No gross deformities of extremities. Neurologic:  Normal speech and language. No gross focal neurologic deficits are appreciated.  Skin:  Skin is warm, dry and intact. Psychiatric: Mood and affect are normal. Speech and behavior are normal.  ____________________________________________   LABS (all labs ordered are listed, but only abnormal results are displayed)  Labs Reviewed  COMPREHENSIVE  METABOLIC PANEL - Abnormal; Notable for the following components:      Result Value   Glucose, Bld 125 (*)    BUN 26 (*)    Creatinine, Ser 1.10 (*)    Total Protein 6.4 (*)    GFR calc non Af Amer 44 (*)    GFR calc Af Amer 51 (*)    All other components within normal limits  CBC - Abnormal; Notable for the following components:   RBC 3.79 (*)    All other components within normal limits  URINALYSIS, COMPLETE (UACMP) WITH MICROSCOPIC - Abnormal; Notable for the following components:   Color, Urine YELLOW (*)    APPearance CLEAR (*)    Leukocytes,Ua TRACE (*)    All other components within normal limits  LIPASE, BLOOD   ____________________________________________  EKG  ED ECG REPORT I, Hinda Kehr, the attending physician, personally viewed and interpreted this ECG.  Date: 10/14/2019 EKG Time: 17: 29 Rate: 79 Rhythm: normal sinus rhythm QRS Axis: normal Intervals: Right bundle branch block ST/T Wave abnormalities: Non-specific ST segment / T-wave changes, but no clear evidence of acute ischemia. Narrative Interpretation: no definitive evidence of acute ischemia; does not meet STEMI criteria.   ____________________________________________  RADIOLOGY I, Hinda Kehr, personally viewed and evaluated these images (plain radiographs) as part of my medical decision making, as well as reviewing the written report by the radiologist.  ED MD interpretation:  No acute/emergent abnormalities, including normal hepatobiliary system and no evidence of SBO/ileus.    Official radiology report(s): CT ABDOMEN PELVIS W CONTRAST  Result Date: 10/15/2019 CLINICAL DATA:  Abdominal pain with nausea and vomiting EXAM: CT ABDOMEN AND PELVIS WITH CONTRAST TECHNIQUE: Multidetector CT imaging of the abdomen and pelvis was performed using the standard protocol following bolus administration of intravenous contrast. CONTRAST:  34mL OMNIPAQUE IOHEXOL 300 MG/ML  SOLN COMPARISON:  CT of the abdomen  and pelvis 03/20/2013 FINDINGS: LOWER CHEST: Normal. HEPATOBILIARY: Normal hepatic contours. No intra- or extrahepatic biliary dilatation. The gallbladder is normal. PANCREAS: Normal pancreas. No ductal dilatation or peripancreatic fluid collection. SPLEEN: Normal. ADRENALS/URINARY TRACT: Unchanged thickening of the left adrenal gland. Bilateral simple renal cysts measuring up to 2.6 cm, increased in size. No nephrolithiasis or hydronephrosis. The urinary bladder is normal for degree of distention STOMACH/BOWEL: There is  no hiatal hernia. Normal duodenal course and caliber. No small bowel dilatation or inflammation. Rectosigmoid diverticulosis without acute inflammation. Appendix not visualized. VASCULAR/LYMPHATIC: There is calcific atherosclerosis of the abdominal aorta. No abdominal or pelvic lymphadenopathy. REPRODUCTIVE: Status post hysterectomy. No adnexal mass. MUSCULOSKELETAL. Grade 1 anterolisthesis at L3-4 due to facet arthrosis. OTHER: None. IMPRESSION: 1. No acute abnormality of the abdomen or pelvis. 2. Rectosigmoid diverticulosis without acute inflammation. 3. Aortic Atherosclerosis (ICD10-I70.0). Electronically Signed   By: Ulyses Jarred M.D.   On: 10/15/2019 00:30    ____________________________________________   PROCEDURES   Procedure(s) performed (including Critical Care):  Procedures   ____________________________________________   INITIAL IMPRESSION / MDM / [place] / ED COURSE  As part of my medical decision making, I reviewed the following data within the [place] History obtained from family, Labs reviewed , EKG interpreted , Old chart reviewed and Notes from prior ED visits   Differential diagnosis includes, but is not limited to, biliary colic, pelvic mass including possibly neoplasm, SBO/ileus, gastroparesis, constipation.  The patient's vital signs are reassuring and she is afebrile.  Metabolic panel and CBC are essentially within normal  limits with a creatinine of 1.1 and a BUN of 26 which still gives her a GFR of 44.  I am giving her 500 mL of normal saline and will obtain a CT scan with IV contrast for the most comprehensive imaging of her abdomen since she has multiple areas of concern.  She may benefit from right upper quadrant ultrasound given those particular areas of tenderness (epigastrium and right upper quadrant) but given the pelvic mass and the question of SBO/ileus versus constipation I think that a CT scan makes the most sense.  She does not need analgesia at this time and has not been nauseated.  Very low suspicion for ACS and I personally reviewed the EKG which shows no sign of ischemia.  Patient and daughter agree with the plan and I will reassess after the imaging is complete.         Clinical Course as of Oct 14 98  Thu Oct 15, 2019  [id number] CT scan was unremarkable with no acute or emergent findings.  I updated the patient and her daughter.  I offered a right upper quadrant ultrasound but explained that I did not think it would be helpful given the healthy appearance of her hepatobiliary system on the CT scan and they agree that they do not want to proceed with that at this time.  We talked about various resolutions of her constipation which seems to be the crux of the issue.  I offered manual disimpaction, enema, oral laxatives here in the ED, versus discharge home with over-the-counter remedies, and they prefer the latter.  I gave my usual and customary constipation management recommendations and return precautions and they understand and agree with the plan.  She will complete the 500 mL normal saline IV bolus I ordered to help flush out the IV contrast and rehydrate her.  CT ABDOMEN PELVIS W CONTRAST [CF]    Clinical Course User Index [CF] Hinda Kehr, MD     ____________________________________________  FINAL CLINICAL IMPRESSION(S) / ED DIAGNOSES  Final diagnoses:  Constipation, unspecified  constipation type  Generalized abdominal pain     MEDICATIONS GIVEN DURING THIS VISIT:  Medications  sodium chloride 0.9 % bolus 500 mL (500 mLs Intravenous New Bag/Given 10/15/19 0033)  iohexol (OMNIPAQUE) 300 MG/ML solution 75 mL (75 mLs Intravenous Contrast Given 10/15/19 0007)  ED Discharge Orders    None      *Please note:  Erica Donovan was evaluated in Emergency Department on 10/15/2019 for the symptoms described in the history of present illness. She was evaluated in the context of the global COVID-19 pandemic, which necessitated consideration that the patient might be at risk for infection with the SARS-CoV-2 virus that causes COVID-19. Institutional protocols and algorithms that pertain to the evaluation of patients at risk for COVID-19 are in a state of rapid change based on information released by regulatory bodies including the CDC and federal and state organizations. These policies and algorithms were followed during the patient's care in the ED.  Some ED evaluations and interventions may be delayed as a result of limited staffing during the pandemic.*  Note:  This document was prepared using Dragon voice recognition software and may include unintentional dictation errors.   Hinda Kehr, MD 10/15/19 0100

## 2019-10-14 NOTE — ED Notes (Signed)
First nurse note- here for abdominal pain from PCP.  Daughter with pt, she has dementia.  NAD

## 2019-10-15 DIAGNOSIS — R1011 Right upper quadrant pain: Secondary | ICD-10-CM | POA: Diagnosis not present

## 2019-10-15 NOTE — Discharge Instructions (Signed)
You were seen in the emergency department today for symptoms that we believe are due to constipation.  We recommend that you use one or more of the following over-the-counter medications in the order described:   1)  Colace (or Dulcolax) 100 mg:  This is a stool softener, and you may take it once or twice a day as needed. 2)  Senna tablets:  This is a bowel stimulant that will help "push" out your stool. It is the next step to add after you have tried a stool softener. 3)  Miralax (powder):  This medication works by drawing additional fluid into your intestines and helps to flush out your stool.  Mix the powder with water or juice according to label instructions.  It may help if the Colace and Senna are not sufficient, but you must be sure to use the recommended amount of water or juice when you mix up the powder. 4)  Look for magnesium citrate at the pharmacy (it is usually a small glass bottle).  Drink the bottle according to the label instructions.  Remember that narcotic pain medications are constipating, so avoid them or minimize their use.  Drink plenty of fluids.  Please return to the Emergency Department immediately if you develop new or worsening symptoms that concern you, such as (but not limited to) fever > 101 degrees, severe abdominal pain, or persistent vomiting.

## 2019-10-15 NOTE — ED Notes (Signed)
Pt transported to CT ?

## 2019-11-26 ENCOUNTER — Other Ambulatory Visit: Payer: Self-pay | Admitting: Family Medicine

## 2019-11-26 DIAGNOSIS — S065X9A Traumatic subdural hemorrhage with loss of consciousness of unspecified duration, initial encounter: Secondary | ICD-10-CM

## 2019-11-26 DIAGNOSIS — S065XAA Traumatic subdural hemorrhage with loss of consciousness status unknown, initial encounter: Secondary | ICD-10-CM

## 2019-11-27 ENCOUNTER — Other Ambulatory Visit: Payer: Self-pay

## 2019-11-27 ENCOUNTER — Ambulatory Visit
Admission: RE | Admit: 2019-11-27 | Discharge: 2019-11-27 | Disposition: A | Discharge status: Home / Self Care | Payer: Medicare Other | Source: Ambulatory Visit | Attending: Family Medicine | Admitting: Family Medicine

## 2019-11-27 DIAGNOSIS — S065X9A Traumatic subdural hemorrhage with loss of consciousness of unspecified duration, initial encounter: Secondary | ICD-10-CM

## 2019-11-27 DIAGNOSIS — S065XAA Traumatic subdural hemorrhage with loss of consciousness status unknown, initial encounter: Secondary | ICD-10-CM

## 2019-12-04 ENCOUNTER — Ambulatory Visit: Payer: Medicare Other

## 2020-04-10 ENCOUNTER — Observation Stay: Payer: Medicare Other

## 2020-04-10 ENCOUNTER — Emergency Department: Payer: Medicare Other

## 2020-04-10 ENCOUNTER — Inpatient Hospital Stay
Admission: EM | Admit: 2020-04-10 | Discharge: 2020-04-18 | DRG: 880 | Disposition: A | Discharge status: Skilled Nursing Facility | Payer: Medicare Other | Attending: Internal Medicine | Admitting: Internal Medicine

## 2020-04-10 DIAGNOSIS — I5032 Chronic diastolic (congestive) heart failure: Secondary | ICD-10-CM | POA: Diagnosis present

## 2020-04-10 DIAGNOSIS — E1142 Type 2 diabetes mellitus with diabetic polyneuropathy: Secondary | ICD-10-CM

## 2020-04-10 DIAGNOSIS — R410 Disorientation, unspecified: Secondary | ICD-10-CM | POA: Diagnosis not present

## 2020-04-10 DIAGNOSIS — E440 Moderate protein-calorie malnutrition: Secondary | ICD-10-CM | POA: Diagnosis present

## 2020-04-10 DIAGNOSIS — F419 Anxiety disorder, unspecified: Secondary | ICD-10-CM | POA: Diagnosis present

## 2020-04-10 DIAGNOSIS — F05 Delirium due to known physiological condition: Principal | ICD-10-CM | POA: Diagnosis present

## 2020-04-10 DIAGNOSIS — G9340 Encephalopathy, unspecified: Secondary | ICD-10-CM | POA: Diagnosis present

## 2020-04-10 DIAGNOSIS — G4730 Sleep apnea, unspecified: Secondary | ICD-10-CM | POA: Diagnosis present

## 2020-04-10 DIAGNOSIS — Z880 Allergy status to penicillin: Secondary | ICD-10-CM

## 2020-04-10 DIAGNOSIS — Z85828 Personal history of other malignant neoplasm of skin: Secondary | ICD-10-CM

## 2020-04-10 DIAGNOSIS — Z66 Do not resuscitate: Secondary | ICD-10-CM | POA: Diagnosis not present

## 2020-04-10 DIAGNOSIS — Z7982 Long term (current) use of aspirin: Secondary | ICD-10-CM

## 2020-04-10 DIAGNOSIS — I130 Hypertensive heart and chronic kidney disease with heart failure and stage 1 through stage 4 chronic kidney disease, or unspecified chronic kidney disease: Secondary | ICD-10-CM | POA: Diagnosis present

## 2020-04-10 DIAGNOSIS — M79604 Pain in right leg: Secondary | ICD-10-CM

## 2020-04-10 DIAGNOSIS — Z20822 Contact with and (suspected) exposure to covid-19: Secondary | ICD-10-CM | POA: Diagnosis present

## 2020-04-10 DIAGNOSIS — Z79899 Other long term (current) drug therapy: Secondary | ICD-10-CM

## 2020-04-10 DIAGNOSIS — Z23 Encounter for immunization: Secondary | ICD-10-CM

## 2020-04-10 DIAGNOSIS — K219 Gastro-esophageal reflux disease without esophagitis: Secondary | ICD-10-CM | POA: Diagnosis present

## 2020-04-10 DIAGNOSIS — R4182 Altered mental status, unspecified: Secondary | ICD-10-CM | POA: Diagnosis not present

## 2020-04-10 DIAGNOSIS — M79605 Pain in left leg: Secondary | ICD-10-CM

## 2020-04-10 DIAGNOSIS — N182 Chronic kidney disease, stage 2 (mild): Secondary | ICD-10-CM | POA: Diagnosis present

## 2020-04-10 DIAGNOSIS — D631 Anemia in chronic kidney disease: Secondary | ICD-10-CM | POA: Diagnosis present

## 2020-04-10 DIAGNOSIS — G9341 Metabolic encephalopathy: Secondary | ICD-10-CM | POA: Diagnosis present

## 2020-04-10 DIAGNOSIS — F0391 Unspecified dementia with behavioral disturbance: Secondary | ICD-10-CM | POA: Diagnosis present

## 2020-04-10 DIAGNOSIS — F32A Depression, unspecified: Secondary | ICD-10-CM | POA: Diagnosis present

## 2020-04-10 DIAGNOSIS — Z87891 Personal history of nicotine dependence: Secondary | ICD-10-CM

## 2020-04-10 DIAGNOSIS — I700 Atherosclerosis of aorta: Secondary | ICD-10-CM | POA: Diagnosis present

## 2020-04-10 DIAGNOSIS — E1122 Type 2 diabetes mellitus with diabetic chronic kidney disease: Secondary | ICD-10-CM | POA: Diagnosis present

## 2020-04-10 DIAGNOSIS — Z881 Allergy status to other antibiotic agents status: Secondary | ICD-10-CM

## 2020-04-10 DIAGNOSIS — N179 Acute kidney failure, unspecified: Secondary | ICD-10-CM | POA: Diagnosis present

## 2020-04-10 DIAGNOSIS — Z791 Long term (current) use of non-steroidal anti-inflammatories (NSAID): Secondary | ICD-10-CM

## 2020-04-10 DIAGNOSIS — R000 Tachycardia, unspecified: Secondary | ICD-10-CM

## 2020-04-10 DIAGNOSIS — M1990 Unspecified osteoarthritis, unspecified site: Secondary | ICD-10-CM | POA: Diagnosis present

## 2020-04-10 DIAGNOSIS — Z888 Allergy status to other drugs, medicaments and biological substances status: Secondary | ICD-10-CM

## 2020-04-10 DIAGNOSIS — J439 Emphysema, unspecified: Secondary | ICD-10-CM | POA: Diagnosis present

## 2020-04-10 DIAGNOSIS — Z6828 Body mass index (BMI) 28.0-28.9, adult: Secondary | ICD-10-CM

## 2020-04-10 DIAGNOSIS — I480 Paroxysmal atrial fibrillation: Secondary | ICD-10-CM | POA: Diagnosis not present

## 2020-04-10 DIAGNOSIS — E785 Hyperlipidemia, unspecified: Secondary | ICD-10-CM | POA: Diagnosis present

## 2020-04-10 LAB — COMPREHENSIVE METABOLIC PANEL
ALT: 14 U/L (ref 0–44)
AST: 21 U/L (ref 15–41)
Albumin: 4 g/dL (ref 3.5–5.0)
Alkaline Phosphatase: 58 U/L (ref 38–126)
Anion gap: 12 (ref 5–15)
BUN: 24 mg/dL — ABNORMAL HIGH (ref 8–23)
CO2: 26 mmol/L (ref 22–32)
Calcium: 10 mg/dL (ref 8.9–10.3)
Chloride: 98 mmol/L (ref 98–111)
Creatinine, Ser: 1.25 mg/dL — ABNORMAL HIGH (ref 0.44–1.00)
GFR, Estimated: 37 mL/min — ABNORMAL LOW (ref >60)
Glucose, Bld: 114 mg/dL — ABNORMAL HIGH (ref 70–99)
Potassium: 4.3 mmol/L (ref 3.5–5.1)
Sodium: 136 mmol/L (ref 135–145)
Total Bilirubin: 0.7 mg/dL (ref 0.3–1.2)
Total Protein: 7 g/dL (ref 6.5–8.1)

## 2020-04-10 LAB — CBC WITH DIFFERENTIAL/PLATELET
Abs Immature Granulocytes: 0.03 10*3/uL (ref 0.00–0.07)
Basophils Absolute: 0 10*3/uL (ref 0.0–0.1)
Basophils Relative: 0 %
Eosinophils Absolute: 0 10*3/uL (ref 0.0–0.5)
Eosinophils Relative: 1 %
HCT: 37.7 % (ref 36.0–46.0)
Hemoglobin: 12.4 g/dL (ref 12.0–15.0)
Immature Granulocytes: 0 %
Lymphocytes Relative: 12 %
Lymphs Abs: 0.9 10*3/uL (ref 0.7–4.0)
MCH: 32.5 pg (ref 26.0–34.0)
MCHC: 32.9 g/dL (ref 30.0–36.0)
MCV: 99 fL (ref 80.0–100.0)
Monocytes Absolute: 0.4 10*3/uL (ref 0.1–1.0)
Monocytes Relative: 5 %
Neutro Abs: 5.9 10*3/uL (ref 1.7–7.7)
Neutrophils Relative %: 82 %
Platelets: 227 10*3/uL (ref 150–400)
RBC: 3.81 MIL/uL — ABNORMAL LOW (ref 3.87–5.11)
RDW: 12.8 % (ref 11.5–15.5)
WBC: 7.3 10*3/uL (ref 4.0–10.5)
nRBC: 0 % (ref 0.0–0.2)

## 2020-04-10 LAB — URINALYSIS, COMPLETE (UACMP) WITH MICROSCOPIC
Bilirubin Urine: NEGATIVE
Glucose, UA: NEGATIVE mg/dL
Hgb urine dipstick: NEGATIVE
Ketones, ur: NEGATIVE mg/dL
Nitrite: NEGATIVE
Protein, ur: NEGATIVE mg/dL
Specific Gravity, Urine: 1.018 (ref 1.005–1.030)
pH: 7 (ref 5.0–8.0)

## 2020-04-10 LAB — RESPIRATORY PANEL BY RT PCR (FLU A&B, COVID)
Influenza A by PCR: NEGATIVE
Influenza B by PCR: NEGATIVE
SARS Coronavirus 2 by RT PCR: NEGATIVE

## 2020-04-10 LAB — LACTIC ACID, PLASMA
Lactic Acid, Venous: 1.2 mmol/L (ref 0.5–1.9)
Lactic Acid, Venous: 0.9 mmol/L (ref 0.5–1.9)

## 2020-04-10 LAB — TROPONIN I (HIGH SENSITIVITY)
Troponin I (High Sensitivity): 8 ng/L (ref <18)
Troponin I (High Sensitivity): 8 ng/L (ref <18)

## 2020-04-10 MED ORDER — ACETAMINOPHEN 325 MG PO TABS: 650.0000 mg | ORAL_TABLET | Freq: Four times a day (QID) | ORAL | Status: DC | PRN

## 2020-04-10 MED ORDER — ONDANSETRON HCL 4 MG PO TABS
4.0000 mg | ORAL_TABLET | Freq: Four times a day (QID) | ORAL | Status: DC | PRN
  Administered 2020-04-11: 4 mg via ORAL
  Filled 2020-04-10: qty 1

## 2020-04-10 MED ORDER — TRAZODONE HCL 50 MG PO TABS: 25.0000 mg | ORAL_TABLET | Freq: Every evening | ORAL | Status: DC | PRN

## 2020-04-10 MED ORDER — SODIUM CHLORIDE 0.9 % IV SOLN: INTRAVENOUS | Status: DC

## 2020-04-10 MED ORDER — ASPIRIN EC 81 MG PO TBEC: 81.0000 mg | DELAYED_RELEASE_TABLET | Freq: Every day | ORAL | Status: DC

## 2020-04-10 MED ORDER — MONTELUKAST SODIUM 10 MG PO TABS
10.0000 mg | ORAL_TABLET | Freq: Every day | ORAL | Status: DC
  Administered 2020-04-11 – 2020-04-17 (×8): 10 mg via ORAL
  Filled 2020-04-10 (×9): qty 1

## 2020-04-10 MED ORDER — ADULT MULTIVITAMIN W/MINERALS CH
1.0000 | ORAL_TABLET | Freq: Every day | ORAL | Status: DC
  Administered 2020-04-11 – 2020-04-18 (×8): 1 via ORAL
  Filled 2020-04-10 (×8): qty 1

## 2020-04-10 MED ORDER — ACETAMINOPHEN 650 MG RE SUPP: 650.0000 mg | RECTAL | Status: DC | PRN

## 2020-04-10 MED ORDER — ONDANSETRON HCL 4 MG/2ML IJ SOLN: 4.0000 mg | Freq: Four times a day (QID) | INTRAMUSCULAR | Status: DC | PRN

## 2020-04-10 MED ORDER — OMEGA-3-ACID ETHYL ESTERS 1 G PO CAPS
1.0000 g | ORAL_CAPSULE | Freq: Every day | ORAL | Status: DC
  Administered 2020-04-11 – 2020-04-18 (×8): 1 g via ORAL
  Filled 2020-04-10 (×8): qty 1

## 2020-04-10 MED ORDER — MAGNESIUM HYDROXIDE 400 MG/5ML PO SUSP: 30.0000 mL | Freq: Every day | ORAL | Status: DC | PRN

## 2020-04-10 MED ORDER — SODIUM CHLORIDE 0.9 % IV SOLN: Freq: Once | INTRAVENOUS | Status: AC

## 2020-04-10 MED ORDER — LORAZEPAM 2 MG/ML IJ SOLN
0.5000 mg | Freq: Once | INTRAMUSCULAR | Status: AC
  Administered 2020-04-11: 0.5 mg via INTRAVENOUS
  Filled 2020-04-10: qty 1

## 2020-04-10 MED ORDER — PANTOPRAZOLE SODIUM 40 MG PO TBEC
40.0000 mg | DELAYED_RELEASE_TABLET | Freq: Every day | ORAL | Status: DC
  Administered 2020-04-11 – 2020-04-18 (×8): 40 mg via ORAL
  Filled 2020-04-10 (×8): qty 1

## 2020-04-10 MED ORDER — LORAZEPAM 0.5 MG PO TABS
0.5000 mg | ORAL_TABLET | Freq: Two times a day (BID) | ORAL | Status: DC | PRN
  Administered 2020-04-11 – 2020-04-12 (×2): 0.5 mg via ORAL
  Filled 2020-04-10 (×4): qty 1

## 2020-04-10 MED ORDER — ENSURE ENLIVE PO LIQD: 237.0000 mL | Freq: Two times a day (BID) | ORAL | Status: DC

## 2020-04-10 MED ORDER — ACETAMINOPHEN 160 MG/5ML PO SOLN
650.0000 mg | ORAL | Status: DC | PRN
  Filled 2020-04-10: qty 20.3

## 2020-04-10 MED ORDER — DIPHENHYDRAMINE HCL 25 MG PO CAPS: 25.0000 mg | ORAL_CAPSULE | Freq: Three times a day (TID) | ORAL | Status: DC | PRN

## 2020-04-10 MED ORDER — ASCORBIC ACID 500 MG PO TABS
500.0000 mg | ORAL_TABLET | Freq: Every day | ORAL | Status: DC
  Administered 2020-04-11 – 2020-04-18 (×8): 500 mg via ORAL
  Filled 2020-04-10 (×8): qty 1

## 2020-04-10 MED ORDER — ESCITALOPRAM OXALATE 10 MG PO TABS
20.0000 mg | ORAL_TABLET | ORAL | Status: DC
  Administered 2020-04-11 – 2020-04-18 (×8): 20 mg via ORAL
  Filled 2020-04-10 (×9): qty 2

## 2020-04-10 MED ORDER — ENOXAPARIN SODIUM 30 MG/0.3ML ~~LOC~~ SOLN
30.0000 mg | SUBCUTANEOUS | Status: DC
  Administered 2020-04-11 – 2020-04-12 (×3): 30 mg via SUBCUTANEOUS
  Filled 2020-04-10 (×3): qty 0.3

## 2020-04-10 MED ORDER — LORATADINE 10 MG PO TABS: 10.0000 mg | ORAL_TABLET | ORAL | Status: DC

## 2020-04-10 MED ORDER — SODIUM CHLORIDE 0.9 % IV SOLN
1.0000 g | Freq: Once | INTRAVENOUS | Status: AC
  Administered 2020-04-10: 1 g via INTRAVENOUS
  Filled 2020-04-10: qty 10

## 2020-04-10 MED ORDER — ACETAMINOPHEN 650 MG RE SUPP: 650.0000 mg | Freq: Four times a day (QID) | RECTAL | Status: DC | PRN

## 2020-04-10 MED ORDER — ASPIRIN EC 81 MG PO TBEC
81.0000 mg | DELAYED_RELEASE_TABLET | Freq: Every day | ORAL | Status: DC
  Administered 2020-04-11 – 2020-04-18 (×8): 81 mg via ORAL
  Filled 2020-04-10 (×8): qty 1

## 2020-04-10 MED ORDER — DOCUSATE SODIUM 100 MG PO CAPS
200.0000 mg | ORAL_CAPSULE | Freq: Every day | ORAL | Status: DC
  Administered 2020-04-11 – 2020-04-18 (×8): 200 mg via ORAL
  Filled 2020-04-10 (×8): qty 2

## 2020-04-10 MED ORDER — LORATADINE 10 MG PO TABS
10.0000 mg | ORAL_TABLET | Freq: Every day | ORAL | Status: DC
  Administered 2020-04-11 – 2020-04-18 (×8): 10 mg via ORAL
  Filled 2020-04-10 (×8): qty 1

## 2020-04-10 MED ORDER — STROKE: EARLY STAGES OF RECOVERY BOOK: Freq: Once | Status: DC

## 2020-04-10 MED ORDER — ENOXAPARIN SODIUM 40 MG/0.4ML ~~LOC~~ SOLN: 40.0000 mg | SUBCUTANEOUS | Status: DC

## 2020-04-10 MED ORDER — ACETAMINOPHEN 325 MG PO TABS: 650.0000 mg | ORAL_TABLET | ORAL | Status: DC | PRN

## 2020-04-10 MED ORDER — SENNOSIDES-DOCUSATE SODIUM 8.6-50 MG PO TABS: 1.0000 | ORAL_TABLET | Freq: Every evening | ORAL | Status: DC | PRN

## 2020-04-10 NOTE — ED Notes (Signed)
Placed purewick on pt. 

## 2020-04-10 NOTE — H&P (Signed)
[place]   PATIENT NAME: Erica Donovan    MR#:  [id number]  DATE OF BIRTH:  [birth year]  DATE OF ADMISSION:  04/10/2020  PRIMARY CARE PHYSICIAN: Juluis Pitch, MD   REQUESTING/REFERRING PHYSICIAN: Nance Pear, MD CHIEF COMPLAINT:   Chief Complaint  Patient presents with  . Weakness  . Altered Mental Status    HISTORY OF PRESENT ILLNESS:  Erica Donovan  is a 85 y.o. Caucasian female with a known history of type diabetes mellitus, hypertension, dyslipidemia and COPD, presenting emergency room with acute onset of altered mental status with confusion and generalized weakness along with low-grade fever 100.4.  She had nausea and vomiting this a.m.  She has been having recent recurrent falls for the last month and a half with subsequent right wrist fracture during 1 fall for which she had ORIF recently.  She had a subdural hemorrhage tomorrow in May of this year.  Her daughter given Phenergan this morning.  She has not been having any appetite today.  She was seen by her primary care physician to assist with pressure ulcer dose fairly clean.  She admits to cough productive of clear sputum.  No diarrhea.  No bilious vomitus or hematemesis.  No chest pain or palpitations.  She denies any neck pain or stiffness.  She admitted to dysuria without urinary frequency urgency or flank pain.  She has been having low back pain though.  Upon presentation to the emergency room patient was 100.4 with a blood pressure 175/77 with otherwise normal vital signs.  Labs revealed a creatinine of 1.25 compared to 1.1 in April of this year, high-sensitivity troponin of 8 and later 8 lactic acid 1.2 and later 0.9 with unremarkable CBC.  COVID-19 PCR and influenza antigens came back negative.  UA showed trace leukocytes and rare bacteria.  Blood cultures and urine culture was sent.  The patient was given a gram of IV Rocephin and 150 mL/h of IV normal saline infusion.  She will be admitted to an  observation medical monitored bed for further evaluation and management.  PAST MEDICAL HISTORY:   Past Medical History:  Diagnosis Date  . Arthritis   . Cancer ([place])    skin cancer  . COPD (chronic obstructive pulmonary disease) (HCC)    mild- no inhalers  . Dementia ([place])   . Depression   . Diabetes mellitus without complication ([place])   . 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 GERD (gastroesophageal reflux disease)   . Hemorrhoids   . History of kidney stones   . Hyperlipidemia   . Hypertension   . Leaky heart valve   . Paroxysmal SVT (supraventricular tachycardia) (HCC)     PAST SURGICAL HISTORY:   Past Surgical History:  Procedure Laterality Date  . ABDOMINAL HYSTERECTOMY    . APPENDECTOMY  1945  . EYE SURGERY     cataract bil  . HEMORRHOID SURGERY    . JOINT REPLACEMENT Left 2008  . ORIF WRIST FRACTURE Right 11/06/2018   Procedure: OPEN REDUCTION INTERNAL FIXATION (ORIF) WRIST FRACTURE RIGHT - DIABETIC, SLEEP APNEA;  Surgeon: Hessie Knows, MD;  Location: [facility] ORS;  Service: Orthopedics;  Laterality: Right;    SOCIAL HISTORY:   Social History   Tobacco Use  . Smoking status: Former Smoker    Types: Cigarettes    Quit date: 11/04/1973    Years since quitting: 46.4  . Smokeless tobacco: Never  Used  Substance Use Topics  . Alcohol use: Never    FAMILY HISTORY:  History reviewed. No pertinent family history.  DRUG ALLERGIES:   Allergies  Allergen Reactions  . Penicillins Nausea Only    Did it involve swelling of the face/tongue/throat, SOB, or low BP? Unknown Did it involve sudden or severe rash/hives, skin peeling, or any reaction on the inside of your mouth or nose? Unknown Did you need to seek medical attention at a hospital or doctor's office? Unknown When did it last happen?Many years ago. If all above answers are "NO", may proceed with cephalosporin use.   . Ciprofloxacin Nausea  Only  . Nitrofurantoin Itching and Rash    REVIEW OF SYSTEMS:   ROS As per history of present illness. All pertinent systems were reviewed above. Constitutional, HEENT, cardiovascular, respiratory, GI, GU, musculoskeletal, neuro, psychiatric, endocrine, integumentary and hematologic systems were reviewed and are otherwise negative/unremarkable except for positive findings mentioned above in the HPI.   MEDICATIONS AT HOME:   Prior to Admission medications   Medication Sig Start Date End Date Taking? Authorizing Provider  acetaminophen (TYLENOL) 500 MG tablet Take 500-1,000 mg by mouth every 6 (six) hours as needed for mild pain.    [provider]  aspirin EC 81 MG tablet Take 81 mg by mouth daily.    [provider]  bismuth subsalicylate (PEPTO BISMOL) 262 MG/15ML suspension Take 30 mLs by mouth every 6 (six) hours as needed for indigestion or diarrhea or loose stools.     [provider]  carboxymethylcellulose (REFRESH PLUS) 0.5 % SOLN Place 1 drop into both eyes 3 (three) times daily as needed (dry eyes).    [provider]  cetirizine (ZYRTEC) 10 MG tablet Take 10 mg by mouth daily as needed for allergies.     [provider]  diphenhydrAMINE (BENADRYL) 25 MG tablet Take 25 mg by mouth every 8 (eight) hours as needed for itching.    [provider]  docusate sodium (COLACE) 100 MG capsule Take 100 mg by mouth daily at 3 pm. And prn    [provider]  escitalopram (LEXAPRO) 10 MG tablet Take 10 mg by mouth every morning.  10/27/18   [provider]  feeding supplement, ENSURE ENLIVE, (ENSURE ENLIVE) LIQD Take 237 mLs by mouth 2 (two) times daily between meals. 03/04/19   [name], MD  HYDROcodone-acetaminophen (NORCO) 5-325 MG tablet Take 1 tablet by mouth every 6 (six) hours as needed. Patient not taking: Reported on 03/02/2019 11/06/18   Hessie Knows, MD  loratadine (CLARITIN) 10 MG tablet Take 10 mg by  mouth every morning.    [provider]  LORazepam (ATIVAN) 0.5 MG tablet Take 0.5 mg by mouth 2 (two) times daily as needed for anxiety.     [provider]  meloxicam (MOBIC) 7.5 MG tablet Take 7.5 mg by mouth as needed for pain.    [provider]  montelukast (SINGULAIR) 10 MG tablet Take 10 mg by mouth at bedtime. 01/07/19 01/07/20  [provider]  Multiple Vitamin (MULTIVITAMIN WITH MINERALS) TABS tablet Take 1 tablet by mouth daily.    [provider]  Omega-3 Fatty Acids (FISH OIL) 1000 MG CAPS Take 1,000 mg by mouth 3 (three) times daily.    [provider]  omeprazole (PRILOSEC) 40 MG capsule Take 40 mg by mouth 2 (two) times a day. 08/07/18   [provider]  Sennosides (EX-LAX PO) Take by mouth as needed.  [provider]  vitamin C (ASCORBIC ACID) 500 MG tablet Take 500 mg by mouth as needed.    [provider]      VITAL SIGNS:  Blood pressure (!) 141/70, pulse 94, temperature (!) 100.4 F (38 C), temperature source Oral, resp. rate (!) 23, height 5\' 3"  (1.6 m), weight 73.6 kg, SpO2 93 %.  PHYSICAL EXAMINATION:  Physical Exam  GENERAL:  86 y.o.-year-old Caucasian female patient lying in the bed with no acute distress.  EYES: Pupils equal, round, reactive to light and accommodation. No scleral icterus. Extraocular muscles intact.  HEENT: Head atraumatic, normocephalic. Oropharynx and nasopharynx clear.  NECK:  Supple, no jugular venous distention. No thyroid enlargement, no tenderness.  LUNGS: Normal breath sounds bilaterally, no wheezing, rales,rhonchi or crepitation. No use of accessory muscles of respiration.  CARDIOVASCULAR: Regular rate and rhythm, S1, S2 normal. No murmurs, rubs, or gallops.  ABDOMEN: Soft, nondistended, nontender. Bowel sounds present. No organomegaly or mass.  EXTREMITIES: No pedal edema, cyanosis, or clubbing.  NEUROLOGIC: Cranial nerves II through XII are intact except for  hearing loss. Muscle strength 5/5 in all extremities. Sensation intact. Gait not checked.  She was moving all 4 extremities. PSYCHIATRIC: The patient is alert and oriented to her name.  Normal affect and good eye contact. SKIN: No obvious rash, lesion, or ulcer.   LABORATORY PANEL:   CBC Recent Labs  Lab 04/10/20 1755  WBC 7.3  HGB 12.4  HCT 37.7  PLT 227   ------------------------------------------------------------------------------------------------------------------  Chemistries  Recent Labs  Lab 04/10/20 1755  NA 136  K 4.3  CL 98  CO2 26  GLUCOSE 114*  BUN 24*  CREATININE 1.25*  CALCIUM 10.0  AST 21  ALT 14  ALKPHOS 58  BILITOT 0.7   ------------------------------------------------------------------------------------------------------------------  Cardiac Enzymes No results for input(s): TROPONINI in the last 168 hours. ------------------------------------------------------------------------------------------------------------------  RADIOLOGY:  CT Head Wo Contrast  Result Date: 04/10/2020 CLINICAL DATA:  86 year old female with altered mental status. EXAM: CT HEAD WITHOUT CONTRAST TECHNIQUE: Contiguous axial images were obtained from the base of the skull through the vertex without intravenous contrast. COMPARISON:  Head CT dated 11/27/2019. FINDINGS: Brain: Moderate age-related atrophy and chronic microvascular ischemic changes. There is no acute intracranial hemorrhage. No mass effect or midline shift. No extra-axial fluid collection. Vascular: No hyperdense vessel or unexpected calcification. Skull: Normal. Negative for fracture or focal lesion. Sinuses/Orbits: No acute finding. Other: None IMPRESSION: 1. No acute intracranial pathology. 2. Moderate age-related atrophy and chronic microvascular ischemic changes. Electronically Signed   By: Anner Crete M.D.   On: 04/10/2020 21:44   DG Chest Portable 1 View  Result Date: 04/10/2020 CLINICAL DATA:   86 year old female with fever. EXAM: PORTABLE CHEST 1 VIEW COMPARISON:  Chest radiograph dated 03/02/2019. FINDINGS: No focal consolidation, pleural effusion, or pneumothorax. Stable cardiac silhouette. Coronary vascular calcification and atherosclerotic calcification of the aortic arch. No acute osseous pathology. IMPRESSION: No active disease. Electronically Signed   By: Anner Crete M.D.   On: 04/10/2020 18:23      IMPRESSION AND PLAN:   1.  Altered mental status with confusion of questionable etiology, rule out frontal CVA.  Differential diagnosis would less likely include UTI that could have been suppressed by recent antibiotic therapy with Bactrim.. -The patient was admitted to a medical monitored observation bed. -We will follow neuro checks every 4 hours to 24 hours. That should be placed on aspirin for now. -Obtain a brain MRI without contrast was bilateral carotid Doppler  and 2D echo. -PT/OT and ST consults to be obtained. -Statin therapy will be provided and fasting lipids will be checked.  2.  Depression and anxiety. -We will continue Lexapro and as needed Ativan.  3.  Type 2 diabetes mellitus without complications. -She will be placed on supplement coverage with NovoLog.  4.  Essential hypertension. -This is apparently diet managed.  It is currently suboptimally controlled and she will be placed therefore on as needed IV labetalol.  All the records are reviewed and case discussed with ED provider. The plan of care was discussed in details with the patient (and family). I answered all questions. The patient agreed to proceed with the above mentioned plan. Further management will depend upon hospital course.   CODE STATUS: Full code  Status is: Observation  The patient remains OBS appropriate and will d/c before 2 midnights.  Dispo: The patient is from: Home              Anticipated d/c is to: Home              Anticipated d/c date is: 1 day              Patient  currently is not medically stable to d/c.   TOTAL TIME TAKING CARE OF THIS PATIENT: 55 minutes.    Christel Mormon M.D on 04/10/2020 at 10:13 PM  [facility]   From 7 PM-7 AM, contact night-coverage [email or web address]  CC: Primary care physician; Juluis Pitch, MD

## 2020-04-10 NOTE — ED Provider Notes (Signed)
[facility] Emergency Department Provider Note   ____________________________________________   I have reviewed the triage vital signs and the nursing notes.   HISTORY  Chief Complaint Weakness and Altered Mental Status   History limited by and level 5 caveat due to: Altered Mental Status   HPI Erica Donovan is a 86 y.o. female who presents to the emergency department today because of concern for altered mental status. Family noticed that the patient was confused this afternoon. Patient herself cannot give great history. Per report patient is normally alert and oriented times 4 and is able to care for herself. EMS did find the patient to have low grade fever. Blood sugar in the 140s. Patient is oriented to name but not place, location or events.   Records reviewed. Per medical record review patient has a history of dementia, HLD, HTN.   Past Medical History:  Diagnosis Date  . Arthritis   . Cancer ([place])    skin cancer  . COPD (chronic obstructive pulmonary disease) (HCC)    mild- no inhalers  . Dementia ([place])   . Depression   . Diabetes mellitus without complication ([place])   . 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 GERD (gastroesophageal reflux disease)   . Hemorrhoids   . History of kidney stones   . Hyperlipidemia   . Hypertension   . Leaky heart valve   . Paroxysmal SVT (supraventricular tachycardia) [facility])     Patient Active Problem List   Diagnosis Date Noted  . Postural dizziness with near syncope 03/02/2019    Past Surgical History:  Procedure Laterality Date  . ABDOMINAL HYSTERECTOMY    . APPENDECTOMY  1945  . EYE SURGERY     cataract bil  . HEMORRHOID SURGERY    . JOINT REPLACEMENT Left 2008  . ORIF WRIST FRACTURE Right 11/06/2018   Procedure: OPEN REDUCTION INTERNAL FIXATION (ORIF) WRIST FRACTURE RIGHT - DIABETIC, SLEEP APNEA;  Surgeon: Hessie Knows, MD;  Location:  [facility] ORS;  Service: Orthopedics;  Laterality: Right;    Prior to Admission medications   Medication Sig Start Date End Date Taking? Authorizing Provider  acetaminophen (TYLENOL) 500 MG tablet Take 500-1,000 mg by mouth every 6 (six) hours as needed for mild pain.    [provider]  aspirin EC 81 MG tablet Take 81 mg by mouth daily.    [provider]  bismuth subsalicylate (PEPTO BISMOL) 262 MG/15ML suspension Take 30 mLs by mouth every 6 (six) hours as needed for indigestion or diarrhea or loose stools.     [provider]  carboxymethylcellulose (REFRESH PLUS) 0.5 % SOLN Place 1 drop into both eyes 3 (three) times daily as needed (dry eyes).    [provider]  cetirizine (ZYRTEC) 10 MG tablet Take 10 mg by mouth daily as needed for allergies.     [provider]  diphenhydrAMINE (BENADRYL) 25 MG tablet Take 25 mg by mouth every 8 (eight) hours as needed for itching.    [provider]  docusate sodium (COLACE) 100 MG capsule Take 100 mg by mouth daily at 3 pm. And prn    [provider]  escitalopram (LEXAPRO) 10 MG tablet Take 10 mg by mouth every morning.  10/27/18   [provider]  feeding supplement, ENSURE ENLIVE, (ENSURE ENLIVE) LIQD Take 237 mLs by mouth 2 (two) times daily between meals. 03/04/19   [name], MD  HYDROcodone-acetaminophen (NORCO) 5-325 MG tablet Take 1 tablet by mouth every 6 (six) hours as needed. Patient not taking: Reported on 03/02/2019 11/06/18   Hessie Knows, MD  loratadine (CLARITIN) 10 MG tablet Take 10 mg by mouth every morning.    [provider]  LORazepam (ATIVAN) 0.5 MG tablet Take 0.5 mg by mouth 2 (two) times daily as needed for anxiety.     [provider]  meloxicam (MOBIC) 7.5 MG tablet Take 7.5 mg by mouth as needed for pain.    [provider]  montelukast (SINGULAIR) 10 MG tablet Take 10 mg by mouth at bedtime. 01/07/19 01/07/20  [provider]  Multiple Vitamin (MULTIVITAMIN WITH MINERALS) TABS tablet Take 1 tablet by mouth daily.    [provider]  Omega-3 Fatty Acids (FISH OIL) 1000 MG CAPS Take 1,000 mg by mouth 3 (three) times daily.    [provider]  omeprazole (PRILOSEC) 40 MG capsule Take 40 mg by mouth 2 (two) times a day. 08/07/18   [provider]  Sennosides (EX-LAX PO) Take by mouth as needed.    [provider]  vitamin C (ASCORBIC ACID) 500 MG tablet Take 500 mg by mouth as needed.    [provider]    Allergies Penicillins, Ciprofloxacin, and Nitrofurantoin  History reviewed. No pertinent family history.  Social History Social History   Tobacco Use  . Smoking status: Former Smoker    Types: Cigarettes    Quit date: 11/04/1973    Years since quitting: 46.4  . Smokeless tobacco: Never Used  Vaping Use  . Vaping Use: Never used  Substance Use Topics  . Alcohol use: Never  . Drug use: Never    Review of Systems Unable to obtain reliable ROS secondary to confusion. ____________________________________________   PHYSICAL EXAM:  VITAL SIGNS: ED Triage Vitals  Enc Vitals Group     BP 04/10/20 1719 (!) 175/77     Pulse Rate 04/10/20 1719 91     Resp 04/10/20 1719 19     Temp 04/10/20 1719 (!) 100.4 F (38 C)     Temp Source 04/10/20 1719 Oral     SpO2 04/10/20 1719 98 %     Weight 04/10/20 1720 162 lb 3.2 oz (73.6 kg)     Height 04/10/20 1720 5\' 3"  (1.6 m)     Head Circumference --      Peak Flow --      Pain Score 04/10/20 1720 0   Constitutional: Awake and alert. Oriented to name only.  Eyes: Conjunctivae are normal.  ENT      Head: Normocephalic and atraumatic.      Nose: No congestion/rhinnorhea.      Mouth/Throat: Mucous membranes are moist.      Neck: No stridor. Hematological/Lymphatic/Immunilogical: No cervical lymphadenopathy. Cardiovascular: Normal rate, regular rhythm.  No murmurs, rubs, or gallops.  Respiratory: Normal  respiratory effort without tachypnea nor retractions. Breath sounds are clear and equal bilaterally. No wheezes/rales/rhonchi. Gastrointestinal: Soft and non tender. No rebound. No guarding.  Genitourinary: Deferred Musculoskeletal: Normal range of motion in all extremities. No lower extremity edema. Neurologic:  Oriented to name only. Moving all extremities.  Skin:  Skin is warm, dry and intact. Small superficial wound to buttocks without erythema, malodor or discharge.   ____________________________________________    LABS (pertinent positives/negatives)  Trop hs 8 Lactic acid 1.2 to 0.9 CMP na 136, k 4.3, glu 114, cr 1.25 CBC wbc 7.3, hgb 12.4, plt 227 COVID  negative UA hazy, trace leukocytes, 0-5 rbc and wbc, rare bacteria ____________________________________________   EKG  None  ____________________________________________    RADIOLOGY  CXR No active disease  CT head No acute abnormality  ____________________________________________   PROCEDURES  Procedures  ____________________________________________   INITIAL IMPRESSION / ASSESSMENT AND PLAN / ED COURSE  Pertinent labs & imaging results that were available during my care of the patient were reviewed by me and considered in my medical decision making (see chart for details).   Patient presented to the emergency department today because of concern for altered mental status and weakness. Family noticed these symptoms today. Patient did have fever of 100.4 initially giving rise to concern for infection. CXR without pna. Blood work without leukocytosis or lactic acidosis. Head CT without acute findings. Urine did have some leukocytes and bacteria. Will send urine culture. Will plan on admission for ams.   ____________________________________________   FINAL CLINICAL IMPRESSION(S) / ED DIAGNOSES  Final diagnoses:  Altered mental status, unspecified altered mental status type     Note: This dictation was  prepared with Dragon dictation. Any transcriptional errors that result from this process are unintentional     Nance Pear, MD 04/10/20 2213

## 2020-04-10 NOTE — ED Triage Notes (Signed)
Per EMS, called to pt's home d/t new onset weakness that began today and AMS. Pt normally A&OX4 per family. Pt lives alone with family across the road, per pt. Pt alert to self. Thinks year is 61. Pt febrile at 100.4 orally.

## 2020-04-10 NOTE — Progress Notes (Signed)
Anticoagulation monitoring(Lovenox):  86 yo female ordered Lovenox 40 mg Q24h  Filed Weights   04/10/20 1720  Weight: 73.6 kg (162 lb 3.2 oz)   BMI    Lab Results  Component Value Date   CREATININE 1.25 (H) 04/10/2020   CREATININE 1.10 (H) 10/14/2019   CREATININE 1.25 (H) 03/02/2019   Estimated Creatinine Clearance: 27.6 mL/min (A) (by C-G formula based on SCr of 1.25 mg/dL (H)). Hemoglobin & Hematocrit     Component Value Date/Time   HGB 12.4 04/10/2020 1755   HGB 13.2 03/02/2013 1346   HCT 37.7 04/10/2020 1755   HCT 38.7 03/02/2013 1346     Per Protocol for Patient with estCrcl < 30 ml/min and BMI < 40, will transition to Lovenox 30 mg Q24h.

## 2020-04-11 ENCOUNTER — Inpatient Hospital Stay
Admit: 2020-04-11 | Discharge: 2020-04-11 | Disposition: A | Discharge status: Home / Self Care | Payer: Medicare Other | Attending: Family Medicine | Admitting: Family Medicine

## 2020-04-11 ENCOUNTER — Observation Stay: Payer: Medicare Other

## 2020-04-11 ENCOUNTER — Other Ambulatory Visit: Payer: Self-pay

## 2020-04-11 DIAGNOSIS — I5032 Chronic diastolic (congestive) heart failure: Secondary | ICD-10-CM | POA: Diagnosis present

## 2020-04-11 DIAGNOSIS — Z515 Encounter for palliative care: Secondary | ICD-10-CM | POA: Diagnosis not present

## 2020-04-11 DIAGNOSIS — G4730 Sleep apnea, unspecified: Secondary | ICD-10-CM | POA: Diagnosis present

## 2020-04-11 DIAGNOSIS — F32A Depression, unspecified: Secondary | ICD-10-CM | POA: Diagnosis present

## 2020-04-11 DIAGNOSIS — G9340 Encephalopathy, unspecified: Secondary | ICD-10-CM | POA: Diagnosis not present

## 2020-04-11 DIAGNOSIS — F0391 Unspecified dementia with behavioral disturbance: Secondary | ICD-10-CM | POA: Diagnosis present

## 2020-04-11 DIAGNOSIS — Z7189 Other specified counseling: Secondary | ICD-10-CM | POA: Diagnosis not present

## 2020-04-11 DIAGNOSIS — M79604 Pain in right leg: Secondary | ICD-10-CM | POA: Diagnosis not present

## 2020-04-11 DIAGNOSIS — Z85828 Personal history of other malignant neoplasm of skin: Secondary | ICD-10-CM | POA: Diagnosis not present

## 2020-04-11 DIAGNOSIS — K219 Gastro-esophageal reflux disease without esophagitis: Secondary | ICD-10-CM | POA: Diagnosis present

## 2020-04-11 DIAGNOSIS — R4182 Altered mental status, unspecified: Secondary | ICD-10-CM | POA: Diagnosis present

## 2020-04-11 DIAGNOSIS — E785 Hyperlipidemia, unspecified: Secondary | ICD-10-CM | POA: Diagnosis present

## 2020-04-11 DIAGNOSIS — Z23 Encounter for immunization: Secondary | ICD-10-CM | POA: Diagnosis present

## 2020-04-11 DIAGNOSIS — I700 Atherosclerosis of aorta: Secondary | ICD-10-CM | POA: Diagnosis present

## 2020-04-11 DIAGNOSIS — D631 Anemia in chronic kidney disease: Secondary | ICD-10-CM | POA: Diagnosis present

## 2020-04-11 DIAGNOSIS — I480 Paroxysmal atrial fibrillation: Secondary | ICD-10-CM | POA: Diagnosis not present

## 2020-04-11 DIAGNOSIS — Z66 Do not resuscitate: Secondary | ICD-10-CM | POA: Diagnosis not present

## 2020-04-11 DIAGNOSIS — N182 Chronic kidney disease, stage 2 (mild): Secondary | ICD-10-CM | POA: Diagnosis present

## 2020-04-11 DIAGNOSIS — N179 Acute kidney failure, unspecified: Secondary | ICD-10-CM | POA: Diagnosis present

## 2020-04-11 DIAGNOSIS — J439 Emphysema, unspecified: Secondary | ICD-10-CM | POA: Diagnosis present

## 2020-04-11 DIAGNOSIS — Z20822 Contact with and (suspected) exposure to covid-19: Secondary | ICD-10-CM | POA: Diagnosis present

## 2020-04-11 DIAGNOSIS — F419 Anxiety disorder, unspecified: Secondary | ICD-10-CM | POA: Diagnosis present

## 2020-04-11 DIAGNOSIS — R000 Tachycardia, unspecified: Secondary | ICD-10-CM | POA: Diagnosis not present

## 2020-04-11 DIAGNOSIS — E1122 Type 2 diabetes mellitus with diabetic chronic kidney disease: Secondary | ICD-10-CM | POA: Diagnosis present

## 2020-04-11 DIAGNOSIS — Z6828 Body mass index (BMI) 28.0-28.9, adult: Secondary | ICD-10-CM | POA: Diagnosis not present

## 2020-04-11 DIAGNOSIS — F05 Delirium due to known physiological condition: Secondary | ICD-10-CM | POA: Diagnosis present

## 2020-04-11 DIAGNOSIS — I130 Hypertensive heart and chronic kidney disease with heart failure and stage 1 through stage 4 chronic kidney disease, or unspecified chronic kidney disease: Secondary | ICD-10-CM | POA: Diagnosis present

## 2020-04-11 DIAGNOSIS — E440 Moderate protein-calorie malnutrition: Secondary | ICD-10-CM | POA: Diagnosis present

## 2020-04-11 DIAGNOSIS — G9341 Metabolic encephalopathy: Secondary | ICD-10-CM | POA: Diagnosis present

## 2020-04-11 DIAGNOSIS — M1990 Unspecified osteoarthritis, unspecified site: Secondary | ICD-10-CM | POA: Diagnosis present

## 2020-04-11 DIAGNOSIS — I4891 Unspecified atrial fibrillation: Secondary | ICD-10-CM | POA: Diagnosis not present

## 2020-04-11 LAB — BASIC METABOLIC PANEL
Anion gap: 8 (ref 5–15)
BUN: 22 mg/dL (ref 8–23)
CO2: 23 mmol/L (ref 22–32)
Calcium: 8.8 mg/dL — ABNORMAL LOW (ref 8.9–10.3)
Chloride: 106 mmol/L (ref 98–111)
Creatinine, Ser: 1.27 mg/dL — ABNORMAL HIGH (ref 0.44–1.00)
GFR, Estimated: 37 mL/min — ABNORMAL LOW (ref >60)
Glucose, Bld: 149 mg/dL — ABNORMAL HIGH (ref 70–99)
Potassium: 4 mmol/L (ref 3.5–5.1)
Sodium: 137 mmol/L (ref 135–145)

## 2020-04-11 LAB — URINE CULTURE

## 2020-04-11 LAB — CBC
HCT: 30.3 % — ABNORMAL LOW (ref 36.0–46.0)
Hemoglobin: 9.9 g/dL — ABNORMAL LOW (ref 12.0–15.0)
MCH: 32.6 pg (ref 26.0–34.0)
MCHC: 32.7 g/dL (ref 30.0–36.0)
MCV: 99.7 fL (ref 80.0–100.0)
Platelets: 179 10*3/uL (ref 150–400)
RBC: 3.04 MIL/uL — ABNORMAL LOW (ref 3.87–5.11)
RDW: 12.9 % (ref 11.5–15.5)
WBC: 4.9 10*3/uL (ref 4.0–10.5)
nRBC: 0 % (ref 0.0–0.2)

## 2020-04-11 LAB — HEMOGLOBIN A1C
Hgb A1c MFr Bld: 6.1 % — ABNORMAL HIGH (ref 4.8–5.6)
Mean Plasma Glucose: 128.37 mg/dL

## 2020-04-11 LAB — LIPID PANEL
Cholesterol: 209 mg/dL — ABNORMAL HIGH (ref 0–200)
HDL: 52 mg/dL (ref >40)
LDL Cholesterol: 136 mg/dL — ABNORMAL HIGH (ref 0–99)
Total CHOL/HDL Ratio: 4 RATIO
Triglycerides: 105 mg/dL (ref <150)
VLDL: 21 mg/dL (ref 0–40)

## 2020-04-11 LAB — GLUCOSE, CAPILLARY
Glucose-Capillary: 163 mg/dL — ABNORMAL HIGH (ref 70–99)
Glucose-Capillary: 89 mg/dL (ref 70–99)
Glucose-Capillary: 116 mg/dL — ABNORMAL HIGH (ref 70–99)
Glucose-Capillary: 132 mg/dL — ABNORMAL HIGH (ref 70–99)
Glucose-Capillary: 98 mg/dL (ref 70–99)

## 2020-04-11 MED ORDER — INSULIN ASPART 100 UNIT/ML ~~LOC~~ SOLN
0.0000 [IU] | Freq: Three times a day (TID) | SUBCUTANEOUS | Status: DC
  Administered 2020-04-11 – 2020-04-13 (×4): 1 [IU] via SUBCUTANEOUS
  Administered 2020-04-13 – 2020-04-15 (×3): 2 [IU] via SUBCUTANEOUS
  Administered 2020-04-15: 1 [IU] via SUBCUTANEOUS
  Administered 2020-04-16: 2 [IU] via SUBCUTANEOUS
  Administered 2020-04-16 (×2): 1 [IU] via SUBCUTANEOUS
  Filled 2020-04-11 (×13): qty 1

## 2020-04-11 MED ORDER — SODIUM CHLORIDE 0.9 % IV SOLN
1.0000 g | Freq: Once | INTRAVENOUS | Status: AC
  Administered 2020-04-11: 1 g via INTRAVENOUS
  Filled 2020-04-11: qty 10

## 2020-04-11 MED ORDER — METOPROLOL TARTRATE 5 MG/5ML IV SOLN
10.0000 mg | Freq: Once | INTRAVENOUS | Status: AC
  Administered 2020-04-12: 10 mg via INTRAVENOUS
  Filled 2020-04-11: qty 10

## 2020-04-11 MED ORDER — METOPROLOL TARTRATE 5 MG/5ML IV SOLN
5.0000 mg | Freq: Once | INTRAVENOUS | Status: AC
  Administered 2020-04-11: 5 mg via INTRAVENOUS
  Filled 2020-04-11: qty 5

## 2020-04-11 MED ORDER — LABETALOL HCL 5 MG/ML IV SOLN: 20.0000 mg | INTRAVENOUS | Status: DC | PRN

## 2020-04-11 MED ORDER — ACETAMINOPHEN 325 MG PO TABS
ORAL_TABLET | ORAL | Status: AC
  Administered 2020-04-11: 650 mg via ORAL
  Filled 2020-04-11: qty 2

## 2020-04-11 NOTE — Evaluation (Signed)
Physical Therapy Evaluation Patient Details Name: Erica Donovan MRN: [id number] DOB: 1925-10-29 Today's Date: 04/11/2020   History of Present Illness  Pt is a 86 yo female that presented to the ED for AMS per family, weakness, fever. Did experience nausea and vomiting as well.   PMH includes COPD, depression, dementia, DM, GERD, HLD, HTN as well as recent R wrist fracture and ORIF.    Clinical Impression  The patient was in bed, family at bedside. Oriented to self, place (hospital), month/day of the week, disoriented to year. Pt able to provide PLOF with occasional assistance from family; pt is normally ambulatory with rollator, modI for ADLs (except weekly showering with daughter) has had 4 falls in the last 6 months. Daughter lives across the street, assists PRN/as able, but pt lives alone predominantly.  The patient was able to move all extremities against gravity, supine <> sit with minA. Sitting balance fair with UE support. Of note pt IV leaking at site, RN notified during and at end of session. Sit <> stand with UE on table in ED (no RW available) and CGA. Pt able to take a step forwards/backwards, and step to the L at edge of bed. Unsteadiness noted throughout, min-CGA to help maintain balance. Pt with some difficulty following commands as well, and some limited safety insights noted. Overall the patient demonstrated deficits (see "PT Problem List") that impede the patient's functional abilities, safety, and mobility and would benefit from skilled PT intervention. Recommendation is SNF due to current level of assistance needed and to maximize safety and mobility.      Follow Up Recommendations SNF    Equipment Recommendations  None recommended by PT    Recommendations for Other Services       Precautions / Restrictions Precautions Precautions: Fall Restrictions Weight Bearing Restrictions: No      Mobility  Bed Mobility Overal bed mobility: Needs Assistance Bed Mobility:  Supine to Sit;Sit to Supine     Supine to sit: Min assist Sit to supine: Min assist   General bed mobility comments: Pt does not normally sleep in the bed at home  Transfers Overall transfer level: Needs assistance               General transfer comment: table used for UE support  Ambulation/Gait Ambulation/Gait assistance: Min guard;Min assist   Assistive device: None;1 person hand held assist (use of bedside table for UE support)       General Gait Details: Pt able to take a step forwards/backwards, and step to the L at edge of bed. Unsteadiness noted throughout, min-CGA to help maintain balance. Pt with some difficulty following commands as well.  Stairs            Wheelchair Mobility    Modified Rankin (Stroke Patients Only)       Balance Overall balance assessment: Needs assistance Sitting-balance support: Feet supported Sitting balance-Leahy Scale: Fair       Standing balance-Leahy Scale: Poor Standing balance comment: reliant on UE support                             Pertinent Vitals/Pain Pain Assessment: No/denies pain    Home Living Family/patient expects to be discharged to:: Private residence Living Arrangements: Alone Available Help at Discharge: Family;Available PRN/intermittently (daughter [name] lives across the street, provides assistance as able) Type of Home: House Home Access: Stairs to enter Entrance Stairs-Rails: Right;Left;Can reach both [employer] of  Steps: 3-4 Home Layout: One level Home Equipment: Grab bars - toilet;Grab bars - tub/shower;Bedside commode;Walker - 2 wheels;Walker - 4 wheels;Tub bench Additional Comments: lift chair. Pt has had 4 falls in the last 6 months. The fall in May of this year resulted in subdural hematoma    Prior Function Level of Independence: Needs assistance   Gait / Transfers Assistance Needed: pt normally ambulatory with rollator, does not dirve  ADL's / Homemaking  Assistance Needed: Pt able to dress mod I usually, assistance/supervision for weekly shower; modI for bird baths        Hand Dominance   Dominant Hand: Right    Extremity/Trunk Assessment   Upper Extremity Assessment Upper Extremity Assessment: Generalized weakness    Lower Extremity Assessment Lower Extremity Assessment: Generalized weakness    Cervical / Trunk Assessment Cervical / Trunk Assessment: Normal  Communication   Communication: No difficulties  Cognition Arousal/Alertness: Awake/alert Behavior During Therapy: WFL for tasks assessed/performed Overall Cognitive Status: History of cognitive impairments - at baseline                                 General Comments: alert, oriented to self, place (hospital), month/ day of the week, disoriented to year      General Comments      Exercises     Assessment/Plan    PT Assessment Patient needs continued PT services  PT Problem List Decreased strength;Decreased mobility;Decreased activity tolerance;Decreased balance;Decreased safety awareness;Decreased knowledge of use of DME       PT Treatment Interventions Therapeutic exercise;DME instruction;Balance training;Stair training;Neuromuscular re-education;Gait training;Functional mobility training;Cognitive remediation;Therapeutic activities;Patient/family education    PT Goals (Current goals can be found in the Care Plan section)  Acute Rehab PT Goals Patient Stated Goal: to go home PT Goal Formulation: With patient Time For Goal Achievement: 04/25/20 Potential to Achieve Goals: Good    Frequency Min 2X/week   Barriers to discharge Decreased caregiver support      Co-evaluation               AM-PAC PT "6 Clicks" Mobility  Outcome Measure Help needed turning from your back to your side while in a flat bed without using bedrails?: A Little Help needed moving from lying on your back to sitting on the side of a flat bed without using  bedrails?: A Little Help needed moving to and from a bed to a chair (including a wheelchair)?: A Little Help needed standing up from a chair using your arms (e.g., wheelchair or bedside chair)?: A Little Help needed to walk in hospital room?: A Lot Help needed climbing 3-5 steps with a railing? : Total 6 Click Score: 15    End of Session Equipment Utilized During Treatment: Gait belt Activity Tolerance: Patient tolerated treatment well Patient left: in bed;with call bell/phone within reach;with family/visitor present Nurse Communication: Mobility status (IV leak) PT Visit Diagnosis: Muscle weakness (generalized) (M62.81);Difficulty in walking, not elsewhere classified (R26.2);Other abnormalities of gait and mobility (R26.89);Unsteadiness on feet (R26.81)    Time: 2202-5427 PT Time Calculation (min) (ACUTE ONLY): 37 min   Charges:   PT Evaluation $PT Eval Low Complexity: 1 Low PT Treatments $Therapeutic Exercise: 23-37 mins       Lieutenant Diego PT, DPT 11:13 AM,04/11/20

## 2020-04-11 NOTE — ED Notes (Addendum)
Assumed care of pt at 2300. Alert and oriented to self, place, but not year or situation. Denies pain or complaints. Daughter at bedside. Bedside US performed, pt now at MRI. Pt O2 sat decreased to 80% while resting in bed, placed on 2L [place] with improvement to 96%. Talking in small phrases with regular and unlabored breathing.

## 2020-04-11 NOTE — ED Notes (Signed)
IV leaking, reinforced and rebandaged IV site. Still bleeding some in [facility]. Patient keeps bending Arm. Advised to keep arm straight to prevent further bleeding at site.

## 2020-04-11 NOTE — Evaluation (Signed)
Occupational Therapy Evaluation Patient Details Name: Erica Donovan MRN: [id number] DOB: 15-Jan-1926 Today's Date: 04/11/2020    History of Present Illness Pt is a 86 yo female that presented to the ED for AMS per family, weakness, fever. Did experience nausea and vomiting as well.   PMH includes COPD, depression, dementia, DM, GERD, HLD, HTN as well as recent R wrist fracture and ORIF.   Clinical Impression   Erica Donovan was seen in the ED for OT evaluation this date. Prior to hospital admission, pt was living alone, with her daughter living across the street. Pt was Mod I in dressing, toileting, grooming, sponge bathing. Pt's daughter came over 1x/week to assist with showering, provides regular assistance with cooking and cleaning, pt also participates in dishwashing, cleaning, and does her own laundry. Pt walks regularly inside her house during hot weather, and walks outside, up and down the street, with cooler weather, always using a RW. Pt did have a small dog but recently allowed the dog to go live with her daughter. Pt used to be an active knitter, but has not been able to do that since breaking her wrist in May 2020. Pt does not drive but does accompany her daughter shopping, having her daughter push her in a rolling cart in the stores. Pt recalls having 2 falls in the past year, but daughter states that pt has had at least 4 falls in the last 12 months. Daughter also states that pt has had increasing episodes of confusion. Today pt was able to state her name and where she was, but did not know the day, month, or year; could not recall her birthday (said it was in 1960), and did not know her daughter's birthday. Erica Donovan currently displays decreased strength, activity tolerance, cognition, and safety awareness, and demonstrates impaired sitting and standing balance, which limit her ability to perform ADL. Pt currently requires Mod A for functional mobility and self-care tasks. Pt would  benefit from skilled OT services to address noted impairments and functional limitations in order to maximize safety and independence while minimizing falls risk and caregiver burden. Upon hospital discharge, recommend SNF for pt to regain strength and self-care abilities. Could consider HHOT if pt continues to improve in the coming days and if it were possible to obtain additional support in the home setting. Pt strongly states that she wishes to return home.      Follow Up Recommendations  SNF    Equipment Recommendations       Recommendations for Other Services       Precautions / Restrictions Precautions Precautions: Fall Restrictions Weight Bearing Restrictions: No      Mobility Bed Mobility Overal bed mobility: Needs Assistance Bed Mobility: Supine to Sit;Sit to Supine     Supine to sit: Min assist Sit to supine: Min assist   General bed mobility comments: Pt does not normally sleep in the bed at home  Transfers Overall transfer level: Needs assistance               General transfer comment: HHA for standing in ED today. Pt normally uses RW or rollator at home    Balance Overall balance assessment: Needs assistance Sitting-balance support: Bilateral upper extremity supported Sitting balance-Leahy Scale: Fair Sitting balance - Comments: after ~ 5 minutes EOB sitting, pt states she if very tired, posterior lean becomes more pronounced Postural control: Posterior lean Standing balance support: Bilateral upper extremity supported Standing balance-Leahy Scale: Poor Standing balance comment: reliant on UE  support                           ADL either performed or assessed with clinical judgement   ADL Overall ADL's : Needs assistance/impaired Eating/Feeding: Modified independent                                           Vision Baseline Vision/History: Wears glasses Wears Glasses: At all times Patient Visual Report: No change  from baseline       Perception     Praxis      Pertinent Vitals/Pain Pain Assessment: No/denies pain     Hand Dominance Right   Extremity/Trunk Assessment Upper Extremity Assessment Upper Extremity Assessment: Generalized weakness   Lower Extremity Assessment Lower Extremity Assessment: Generalized weakness   Cervical / Trunk Assessment Cervical / Trunk Assessment: Normal   Communication Communication Communication: No difficulties   Cognition Arousal/Alertness: Awake/alert Behavior During Therapy: WFL for tasks assessed/performed Overall Cognitive Status: History of cognitive impairments - at baseline                                 General Comments: Pt oriented to self and place, unable to name current month, date, or year. Asked when her birthday was, she responded "1960."   General Comments       Exercises Other Exercises Other Exercises: Educated re: role of OT, POC, DC possibilities, falls prevention, EC   Shoulder Instructions      Home Living Family/patient expects to be discharged to:: Private residence Living Arrangements: Alone Available Help at Discharge: Family;Available PRN/intermittently Type of Home: House Home Access: Stairs to enter [employer] of Steps: 3-4 Entrance Stairs-Rails: Right;Left;Can reach both Home Layout: One level     Bathroom Shower/Tub: Teacher, early years/pre: Standard Bathroom Accessibility: Yes How Accessible: Accessible via walker Home Equipment: Grab bars - toilet;Grab bars - tub/shower;Bedside commode;Walker - 2 wheels;Walker - 4 wheels;Tub bench   Additional Comments: lift chair. Pt has had 4 falls in the last 6 months. The fall in May of this year resulted in subdural hematoma; fall in May 2021 result in fx of R wrist, requiring surgical repair      Prior Functioning/Environment Level of Independence: Needs assistance  Gait / Transfers Assistance Needed: pt normally ambulatory  with rollator, does not dirve ADL's / Homemaking Assistance Needed: Pt able to dress mod I usually, assistance/supervision for weekly shower; modI for bird baths            OT Problem List: Decreased strength;Decreased activity tolerance;Impaired balance (sitting and/or standing);Decreased cognition;Decreased safety awareness      OT Treatment/Interventions: Self-care/ADL training;DME and/or AE instruction;Therapeutic activities;Balance training;Therapeutic exercise;Energy conservation;Patient/family education    OT Goals(Current goals can be found in the care plan section) Acute Rehab OT Goals Patient Stated Goal: to go home OT Goal Formulation: With patient/family Time For Goal Achievement: 04/25/20 Potential to Achieve Goals: Good ADL Goals Pt Will Perform Grooming: with supervision;sitting Pt Will Perform Upper Body Bathing: with min assist;sitting Pt Will Transfer to Toilet: with min assist;bedside commode (using LRAD)  OT Frequency: Min 1X/week   Barriers to D/C: Decreased caregiver support          Co-evaluation  AM-PAC OT "6 Clicks" Daily Activity     Outcome Measure Help from another person eating meals?: A Little Help from another person taking care of personal grooming?: A Little Help from another person toileting, which includes using toliet, bedpan, or urinal?: A Little Help from another person bathing (including washing, rinsing, drying)?: A Lot Help from another person to put on and taking off regular upper body clothing?: A Lot Help from another person to put on and taking off regular lower body clothing?: A Lot 6 Click Score: 15   End of Session    Activity Tolerance: Patient tolerated treatment well Patient left: in bed;with family/visitor present;with call bell/phone within reach  OT Visit Diagnosis: Unsteadiness on feet (R26.81);Repeated falls (R29.6);Muscle weakness (generalized) (M62.81);Other symptoms and signs involving cognitive  function                Time: 1350-1432 OT Time Calculation (min): 42 min Charges:  OT General Charges $OT Visit: 1 Visit OT Evaluation $OT Eval Moderate Complexity: 1 Mod OT Treatments $Self Care/Home Management : 23-37 mins  Josiah Lobo, PhD, MS, OTR/L ascom [phone] 04/11/20, 3:23 PM

## 2020-04-11 NOTE — ED Notes (Signed)
Assumed care of pt at 1900. Bed linens, brief, and purewick changed by RN with family assistance. Side rails up x2, call bell within reach. Pt refusing to wear fall socks at this time, daughter in room with pt. Medicated per MAR. Pt alert and oriented only to self and place. Talking in full sentences with regular and unlabored breathing.

## 2020-04-11 NOTE — Progress Notes (Signed)
SLP Cancellation Note  Patient Details Name: Erica Donovan MRN: [id number] DOB: May 23, 1926   Cancelled treatment:       Reason Eval/Treat Not Completed:  (chart reviewed; pt remains in ED. ). Noted verbal engagement and good attention to tasks per NSG notes; Dtr present w/ pt in the ED. ST services will f/u w/ pt tomorrow for a cognitive-linguistic assessment as she transitions to a regular room.     Orinda Kenner, MS, CCC-SLP Speech Language Pathologist Rehab Services [phone] [facility] 04/11/2020, 4:03 PM

## 2020-04-11 NOTE — Progress Notes (Signed)
*  PRELIMINARY RESULTS* Echocardiogram 2D Echocardiogram has been performed.  Erica Donovan 04/11/2020, 7:44 PM

## 2020-04-11 NOTE — ED Notes (Signed)
Echo just completed at bedside

## 2020-04-11 NOTE — ED Notes (Signed)
Randol Kern NP notified of continued elevated HR (currently 135) after administering metoprolol

## 2020-04-11 NOTE — Progress Notes (Signed)
PROGRESS NOTE    Erica Donovan  JQB:[id number] DOB: March 11, 1926 DOA: 04/10/2020 PCP: Juluis Pitch, MD    Chief Complaint  Patient presents with  . Weakness  . Altered Mental Status    Brief Narrative:  86 year old lady with prior history of hypertension hyperlipidemia, COPD and diabetes presents with altered mental status, confusion and generalized weakness along with fever.  Abs were significant for AKI and urine was abnormal showing trace leukocytes and rare bacteria.  She was admitted for further evaluation of acute metabolic encephalopathy.  Patient seen and examined at bedside denies any new complaints at this time.  Assessment & Plan:   Active Problems:   AMS (altered mental status)  Acute metabolic encephalopathy Differential diagnosis UTI versus CVA MRI of the brain does not show any acute CVA.  Carotid duplex and 2D echocardiogram are pending at this time.  Therapy evaluations are pending. Lipid panel shows LDL of 136, hemoglobin A1c is pending.   Urinary tract infection Urine cultures are pending. Continue with IV Rocephin.     Essential hypertension Blood pressure parameters are optimal.     Type 2 diabetes mellitus Continue with sliding scale insulin, A1c is pending CBG (last 3)  Recent Labs    04/11/20 0116 04/11/20 1110  GLUCAP 163* 89     Acute metabolic encephalopathy:  Improving, she appears to be back to baseline.    Fever: possibly from the UTI.  Blood cultures and urine cultures ordered and pending.      chronic partial thickness wound to inner gluteal fold of buttocks possibly a chronic shear wound not pressure injury.  Wound care consulted , recommended foam dressing to buttocks, change every 3 days or prn for soiling.    DVT prophylaxis: Lovenox Code Status: (Full code Family Communication: discussed with wife at bedside.  Disposition:   Status is: Observation  The patient will require care spanning > 2 midnights and  should be moved to inpatient because: Ongoing diagnostic testing needed not appropriate for outpatient work up  Dispo: The patient is from: Home              Anticipated d/c is to: Pending              Anticipated d/c date is: 1 day              Patient currently is not medically stable to d/c.       Consultants:   None  Procedures: Ultrasound of the carotids, echocardiogram  Antimicrobials: IV Rocephin  Subjective: No new complaints at this time, wants to know when she can go home.   Objective: Vitals:   04/11/20 0500 04/11/20 0600 04/11/20 0700 04/11/20 0800  BP: (!) 99/51 (!) 108/54 (!) 112/54 (!) 116/57  Pulse: 72 71 77 72  Resp: 17 15 18 18   Temp:      TempSrc:      SpO2: 98% 100% 98% 100%  Weight:      Height:        Intake/Output Summary (Last 24 hours) at 04/11/2020 0811 Last data filed at 04/10/2020 2151 Gross per 24 hour  Intake 100 ml  Output --  Net 100 ml   Filed Weights   04/10/20 1720  Weight: 73.6 kg    Examination:  General exam: Appears calm and comfortable  Respiratory system: Clear to auscultation. Respiratory effort normal. Cardiovascular system: S1 & S2 heard, RRR. No JVD, . No pedal edema. Gastrointestinal system: Abdomen is nondistended, soft and nontender.  Normal bowel sounds heard. Central nervous system: Alert and orientedto person only. Extremities: Symmetric 5 x 5 power. Skin: chronic shear tear in the back.  Psychiatry:  Mood & affect appropriate.     Data Reviewed: I have personally reviewed following labs and imaging studies  CBC: Recent Labs  Lab 04/10/20 1755 04/11/20 0429  WBC 7.3 4.9  NEUTROABS 5.9  --   HGB 12.4 9.9*  HCT 37.7 30.3*  MCV 99.0 99.7  PLT 227 622    Basic Metabolic Panel: Recent Labs  Lab 04/10/20 1755 04/11/20 0429  NA 136 137  K 4.3 4.0  CL 98 106  CO2 26 23  GLUCOSE 114* 149*  BUN 24* 22  CREATININE 1.25* 1.27*  CALCIUM 10.0 8.8*    GFR: Estimated Creatinine Clearance:  27.2 mL/min (A) (by C-G formula based on SCr of 1.27 mg/dL (H)).  Liver Function Tests: Recent Labs  Lab 04/10/20 1755  AST 21  ALT 14  ALKPHOS 58  BILITOT 0.7  PROT 7.0  ALBUMIN 4.0    CBG: Recent Labs  Lab 04/11/20 0116  GLUCAP 163*     Recent Results (from the past 240 hour(s))  Blood culture (routine x 2)     Status: None (Preliminary result)   Collection Time: 04/10/20  5:55 PM   Specimen: BLOOD  Result Value Ref Range Status   Specimen Description BLOOD RIGHT [facility]  Final   Special Requests   Final    BOTTLES DRAWN AEROBIC AND ANAEROBIC Blood Culture adequate volume   Culture   Final    NO GROWTH < 12 HOURS Performed at [facility], [address], [place], [place] [address]    Report Status PENDING  Incomplete  Blood culture (routine x 2)     Status: None (Preliminary result)   Collection Time: 04/10/20  5:55 PM   Specimen: BLOOD  Result Value Ref Range Status   Specimen Description BLOOD LEFT AC  Final   Special Requests   Final    BOTTLES DRAWN AEROBIC AND ANAEROBIC Blood Culture adequate volume   Culture   Final    NO GROWTH < 12 HOURS Performed at [facility], [address] [place], [place] [address]    Report Status PENDING  Incomplete  Respiratory Panel by RT PCR (Flu A&B, Covid) - Nasopharyngeal Swab     Status: None   Collection Time: 04/10/20  5:55 PM   Specimen: Nasopharyngeal Swab  Result Value Ref Range Status   SARS Coronavirus 2 by RT PCR NEGATIVE NEGATIVE Final    Comment: (NOTE) SARS-CoV-2 target nucleic acids are NOT DETECTED.  The SARS-CoV-2 RNA is generally detectable in upper respiratoy specimens during the acute phase of infection. The lowest concentration of SARS-CoV-2 viral copies this assay can detect is 131 copies/mL. A negative result does not preclude SARS-Cov-2 infection and should not be used as the sole basis for treatment or other patient management decisions. A negative result may occur with   improper specimen collection/handling, submission of specimen other than nasopharyngeal swab, presence of viral mutation(s) within the areas targeted by this assay, and inadequate number of viral copies (<131 copies/mL). A negative result must be combined with clinical observations, patient history, and epidemiological information. The expected result is Negative.  Fact Sheet for Patients:  PinkCheek.be  Fact Sheet for Healthcare Providers:  GravelBags.it  This test is no t yet approved or cleared by the [place] FDA and  has been authorized for detection and/or diagnosis  of SARS-CoV-2 by FDA under an Emergency Use Authorization (EUA). This EUA will remain  in effect (meaning this test can be used) for the duration of the COVID-19 declaration under Section 564(b)(1) of the Act, 21 U.S.C. section 360bbb-3(b)(1), unless the authorization is terminated or revoked sooner.     Influenza A by PCR NEGATIVE NEGATIVE Final   Influenza B by PCR NEGATIVE NEGATIVE Final    Comment: (NOTE) The Xpert Xpress SARS-CoV-2/FLU/RSV assay is intended as an aid in  the diagnosis of influenza from Nasopharyngeal swab specimens and  should not be used as a sole basis for treatment. Nasal washings and  aspirates are unacceptable for Xpert Xpress SARS-CoV-2/FLU/RSV  testing.  Fact Sheet for Patients: PinkCheek.be  Fact Sheet for Healthcare Providers: GravelBags.it  This test is not yet approved or cleared by the [place] FDA and  has been authorized for detection and/or diagnosis of SARS-CoV-2 by  FDA under an Emergency Use Authorization (EUA). This EUA will remain  in effect (meaning this test can be used) for the duration of the  Covid-19 declaration under Section 564(b)(1) of the Act, 21  U.S.C. section 360bbb-3(b)(1), unless the authorization is  terminated or  revoked. Performed at [facility], [address], [place], [place] [address]          Radiology Studies: CT Head Wo Contrast  Result Date: 04/10/2020 CLINICAL DATA:  86 year old female with altered mental status. EXAM: CT HEAD WITHOUT CONTRAST TECHNIQUE: Contiguous axial images were obtained from the base of the skull through the vertex without intravenous contrast. COMPARISON:  Head CT dated 11/27/2019. FINDINGS: Brain: Moderate age-related atrophy and chronic microvascular ischemic changes. There is no acute intracranial hemorrhage. No mass effect or midline shift. No extra-axial fluid collection. Vascular: No hyperdense vessel or unexpected calcification. Skull: Normal. Negative for fracture or focal lesion. Sinuses/Orbits: No acute finding. Other: None IMPRESSION: 1. No acute intracranial pathology. 2. Moderate age-related atrophy and chronic microvascular ischemic changes. Electronically Signed   By: Anner Crete M.D.   On: 04/10/2020 21:44   MR BRAIN WO CONTRAST  Result Date: 04/11/2020 CLINICAL DATA:  Initial evaluation for acute altered mental status, confusion. EXAM: MRI HEAD WITHOUT CONTRAST TECHNIQUE: Multiplanar, multiecho pulse sequences of the brain and surrounding structures were obtained without intravenous contrast. COMPARISON:  Prior head CT from 04/10/2020. FINDINGS: Brain: Examination mildly degraded by motion artifact. Generalized age-related cerebral atrophy. Patchy and confluent T2/FLAIR hyperintensity within the periventricular deep white matter both cerebral hemispheres most consistent with chronic small vessel ischemic disease, moderate in nature. No abnormal foci of restricted diffusion to suggest acute or subacute ischemia. Gray-white matter differentiation maintained. No encephalomalacia to suggest chronic cortical infarction. No foci of susceptibility artifact to suggest acute or chronic intracranial hemorrhage. No mass lesion, midline shift or mass  effect. No hydrocephalus or extra-axial fluid collection. Pituitary gland suprasellar region normal. Midline structures intact. Vascular: Major intracranial vascular flow voids are maintained. Skull and upper cervical spine: Craniocervical junction within normal limits. Bone marrow signal intensity normal. No scalp soft tissue abnormality. Sinuses/Orbits: Patient status post bilateral ocular lens replacement. Globes and orbital soft tissues demonstrate no acute finding. Mild scattered mucosal thickening noted within the ethmoidal air cells and right maxillary sinus. Paranasal sinuses are otherwise clear. No mastoid effusion. Inner ear structures grossly normal. Other: None. IMPRESSION: 1. No acute intracranial abnormality. 2. Age-related cerebral atrophy with moderate chronic microvascular ischemic disease. Electronically Signed   By: Jeannine Boga M.D.   On: 04/11/2020 01:44  US Carotid Bilateral (at [facility] and [facility] only)  Result Date: 04/11/2020 CLINICAL DATA:  86 year old female with confusion. EXAM: BILATERAL CAROTID DUPLEX ULTRASOUND TECHNIQUE: Pearline Cables scale imaging, color Doppler and duplex ultrasound were performed of bilateral carotid and vertebral arteries in the neck. COMPARISON:  03/03/2019 FINDINGS: Criteria: Quantification of carotid stenosis is based on velocity parameters that correlate the residual internal carotid diameter with NASCET-based stenosis levels, using the diameter of the distal internal carotid lumen as the denominator for stenosis measurement. The following velocity measurements were obtained: RIGHT ICA: Peak systolic velocity 76 cm/sec, End diastolic velocity 17 cm/sec CCA: Peak systolic velocity 63 cm/sec SYSTOLIC ICA/CCA RATIO:  1.2 ECA: Peak systolic velocity 542 cm/sec LEFT ICA: Peak systolic velocity 89 cm/sec, End diastolic velocity 30 cm/sec CCA: 62 cm/sec SYSTOLIC ICA/CCA RATIO:  1.4 ECA: 92 cm/sec RIGHT CAROTID ARTERY: Minimal atherosclerotic plaque formation at the  carotid bifurcation. No significant tortuosity. Normal low resistance waveforms. RIGHT VERTEBRAL ARTERY:  Antegrade flow. LEFT CAROTID ARTERY: Minimal atherosclerotic plaque formation at the carotid bifurcation. No significant tortuosity. Normal low resistance waveforms. LEFT VERTEBRAL ARTERY:  Antegrade flow. Upper extremity non-invasive blood pressures: Not obtained IMPRESSION: 1. Right carotid artery system: Less than 50% stenosis secondary to minimal atherosclerotic plaque formation. 2. Left carotid artery system: Less than 50% stenosis secondary to minimal atherosclerotic plaque formation. 3.  Vertebral artery system: Patent with antegrade flow bilaterally. Ruthann Cancer, MD [facility] Radiology Electronically Signed   By: Ruthann Cancer MD   On: 04/11/2020 07:30   DG Chest Portable 1 View  Result Date: 04/10/2020 CLINICAL DATA:  86 year old female with fever. EXAM: PORTABLE CHEST 1 VIEW COMPARISON:  Chest radiograph dated 03/02/2019. FINDINGS: No focal consolidation, pleural effusion, or pneumothorax. Stable cardiac silhouette. Coronary vascular calcification and atherosclerotic calcification of the aortic arch. No acute osseous pathology. IMPRESSION: No active disease. Electronically Signed   By: Anner Crete M.D.   On: 04/10/2020 18:23        Scheduled Meds: .  stroke: mapping our early stages of recovery book   Does not apply Once  . vitamin C  500 mg Oral Daily  . aspirin EC  81 mg Oral Daily  . docusate sodium  200 mg Oral Daily  . enoxaparin (LOVENOX) injection  30 mg Subcutaneous Q24H  . escitalopram  20 mg Oral BH-q7a  . feeding supplement (ENSURE ENLIVE)  237 mL Oral BID BM  . insulin aspart  0-9 Units Subcutaneous TID PC & HS  . loratadine  10 mg Oral Daily  . montelukast  10 mg Oral QHS  . multivitamin with minerals  1 tablet Oral Daily  . omega-3 acid ethyl esters  1 g Oral Daily  . pantoprazole  40 mg Oral Daily    Continuous Infusions: . sodium chloride       LOS: 0 days        Hosie Poisson, MD [facility]   To contact the attending provider between 7A-7P or the covering provider during after hours 7P-7A, please log into the web site [email or web address] and access using universal [place] password for that web site. If you do not have the password, please call the hospital operator.  04/11/2020, 8:11 AM

## 2020-04-11 NOTE — ED Notes (Signed)
IV placement unsuccessful after 2 attempts. IV team to be consulted.

## 2020-04-11 NOTE — ED Notes (Signed)
HR 135 at this time

## 2020-04-11 NOTE — Consult Note (Signed)
[facility] Nurse Consult Note: Reason for Consult: Consult requested for buttocks.  Pt's daughter at the bedside states this is a chronic wound.  Wound type: Partial thickness wound to inner gluteal fold of buttocks; appearance and location are consistent with chronic shear, not a pressure injury.  Measurement: .2X.2X.1cm, pink and dry Dressing procedure/placement/frequency: Topical treatment orders provided for bedside nurses to perform as follows to protect and promote healing: Foam dressing to buttocks, change Q 3 days or PRN soiling, Please re-consult if further assistance is needed.  Thank-you,  Julien Girt MSN, [place], [place], [place], [place]

## 2020-04-11 NOTE — ED Notes (Addendum)
Pt HR from 90s NS to 130s, appears to be afib RVR, Sharion Settler, NP made aware and receiving RN Rebekah. 12lead EKG obtained. Awaiting orders. Pt denies complaints

## 2020-04-11 NOTE — ED Notes (Signed)
Daughter remains at bedside, pt resting comfortable with regular and unlabored breathing

## 2020-04-12 ENCOUNTER — Other Ambulatory Visit: Payer: Self-pay

## 2020-04-12 ENCOUNTER — Encounter: Payer: Self-pay | Admitting: Internal Medicine

## 2020-04-12 ENCOUNTER — Inpatient Hospital Stay: Payer: Medicare Other

## 2020-04-12 DIAGNOSIS — Z7189 Other specified counseling: Secondary | ICD-10-CM

## 2020-04-12 DIAGNOSIS — Z66 Do not resuscitate: Secondary | ICD-10-CM

## 2020-04-12 DIAGNOSIS — M79604 Pain in right leg: Secondary | ICD-10-CM

## 2020-04-12 DIAGNOSIS — M79605 Pain in left leg: Secondary | ICD-10-CM

## 2020-04-12 DIAGNOSIS — R000 Tachycardia, unspecified: Secondary | ICD-10-CM

## 2020-04-12 DIAGNOSIS — Z515 Encounter for palliative care: Secondary | ICD-10-CM

## 2020-04-12 LAB — CBC
HCT: 33 % — ABNORMAL LOW (ref 36.0–46.0)
Hemoglobin: 11.1 g/dL — ABNORMAL LOW (ref 12.0–15.0)
MCH: 33.1 pg (ref 26.0–34.0)
MCHC: 33.6 g/dL (ref 30.0–36.0)
MCV: 98.5 fL (ref 80.0–100.0)
Platelets: 189 10*3/uL (ref 150–400)
RBC: 3.35 MIL/uL — ABNORMAL LOW (ref 3.87–5.11)
RDW: 12.9 % (ref 11.5–15.5)
WBC: 5.6 10*3/uL (ref 4.0–10.5)
nRBC: 0 % (ref 0.0–0.2)

## 2020-04-12 LAB — GLUCOSE, CAPILLARY
Glucose-Capillary: 110 mg/dL — ABNORMAL HIGH (ref 70–99)
Glucose-Capillary: 112 mg/dL — ABNORMAL HIGH (ref 70–99)
Glucose-Capillary: 100 mg/dL — ABNORMAL HIGH (ref 70–99)
Glucose-Capillary: 127 mg/dL — ABNORMAL HIGH (ref 70–99)

## 2020-04-12 LAB — ECHOCARDIOGRAM COMPLETE
AR max vel: 1.69 cm2
AV Peak grad: 12.5 mmHg
Ao pk vel: 1.77 m/s
Area-P 1/2: 4.17 cm2
Height: 63 in
S' Lateral: 2.64 cm
Weight: 2595.2 oz

## 2020-04-12 LAB — MAGNESIUM: Magnesium: 1.7 mg/dL (ref 1.7–2.4)

## 2020-04-12 LAB — IRON AND TIBC
Iron: 21 ug/dL — ABNORMAL LOW (ref 28–170)
Saturation Ratios: 9 % — ABNORMAL LOW (ref 10.4–31.8)
TIBC: 223 ug/dL — ABNORMAL LOW (ref 250–450)
UIBC: 202 ug/dL

## 2020-04-12 LAB — FERRITIN: Ferritin: 76 ng/mL (ref 11–307)

## 2020-04-12 LAB — FIBRIN DERIVATIVES D-DIMER (ARMC ONLY): Fibrin derivatives D-dimer (ARMC): 995.44 ng/mL (FEU) — ABNORMAL HIGH (ref 0.00–499.00)

## 2020-04-12 LAB — BASIC METABOLIC PANEL
Anion gap: 9 (ref 5–15)
BUN: 19 mg/dL (ref 8–23)
CO2: 24 mmol/L (ref 22–32)
Calcium: 9.1 mg/dL (ref 8.9–10.3)
Chloride: 103 mmol/L (ref 98–111)
Creatinine, Ser: 1.18 mg/dL — ABNORMAL HIGH (ref 0.44–1.00)
GFR, Estimated: 40 mL/min — ABNORMAL LOW (ref >60)
Glucose, Bld: 109 mg/dL — ABNORMAL HIGH (ref 70–99)
Potassium: 4 mmol/L (ref 3.5–5.1)
Sodium: 136 mmol/L (ref 135–145)

## 2020-04-12 LAB — RETICULOCYTES
Immature Retic Fract: 16.2 % — ABNORMAL HIGH (ref 2.3–15.9)
RBC.: 3.4 MIL/uL — ABNORMAL LOW (ref 3.87–5.11)
Retic Count, Absolute: 61.2 10*3/uL (ref 19.0–186.0)
Retic Ct Pct: 1.8 % (ref 0.4–3.1)

## 2020-04-12 LAB — VITAMIN B12: Vitamin B-12: 383 pg/mL (ref 180–914)

## 2020-04-12 LAB — TSH: TSH: 1.989 u[IU]/mL (ref 0.350–4.500)

## 2020-04-12 LAB — FOLATE: Folate: 35 ng/mL (ref >5.9)

## 2020-04-12 LAB — BRAIN NATRIURETIC PEPTIDE: B Natriuretic Peptide: 326.6 pg/mL — ABNORMAL HIGH (ref 0.0–100.0)

## 2020-04-12 MED ORDER — AMIODARONE HCL 200 MG PO TABS
200.0000 mg | ORAL_TABLET | Freq: Two times a day (BID) | ORAL | Status: DC
  Administered 2020-04-12 – 2020-04-18 (×13): 200 mg via ORAL
  Filled 2020-04-12 (×15): qty 1

## 2020-04-12 MED ORDER — FERROUS SULFATE 325 (65 FE) MG PO TABS
325.0000 mg | ORAL_TABLET | Freq: Two times a day (BID) | ORAL | Status: DC
  Administered 2020-04-12 – 2020-04-18 (×12): 325 mg via ORAL
  Filled 2020-04-12 (×12): qty 1

## 2020-04-12 MED ORDER — IOHEXOL 350 MG/ML SOLN
60.0000 mL | Freq: Once | INTRAVENOUS | Status: AC | PRN
  Administered 2020-04-12: 60 mL via INTRAVENOUS
  Filled 2020-04-12: qty 60

## 2020-04-12 MED ORDER — METOPROLOL TARTRATE 25 MG PO TABS
25.0000 mg | ORAL_TABLET | Freq: Two times a day (BID) | ORAL | Status: DC
  Administered 2020-04-12: 25 mg via ORAL
  Filled 2020-04-12: qty 1

## 2020-04-12 MED ORDER — MAGNESIUM SULFATE IN D5W 1-5 GM/100ML-% IV SOLN
1.0000 g | INTRAVENOUS | Status: AC
  Administered 2020-04-12: 1 g via INTRAVENOUS
  Filled 2020-04-12 (×2): qty 100

## 2020-04-12 MED ORDER — ATORVASTATIN CALCIUM 20 MG PO TABS
20.0000 mg | ORAL_TABLET | Freq: Every day | ORAL | Status: DC
  Administered 2020-04-12 – 2020-04-16 (×5): 20 mg via ORAL
  Filled 2020-04-12 (×5): qty 1

## 2020-04-12 NOTE — ED Notes (Signed)
Pt transported to CT ?

## 2020-04-12 NOTE — ED Notes (Signed)
X-ray at bedside

## 2020-04-12 NOTE — Evaluation (Signed)
Speech Language Pathology Evaluation Patient Details Name: Erica Donovan MRN: [id number] DOB: Oct 31, 1925 Today's Date: 04/12/2020 Time: 6962-9528 SLP Time Calculation (min) (ACUTE ONLY): 15 min  Problem List:  Patient Active Problem List   Diagnosis Date Noted  . Acute encephalopathy 04/11/2020  . AMS (altered mental status) 04/10/2020  . Postural dizziness with near syncope 03/02/2019   Past Medical History:  Past Medical History:  Diagnosis Date  . Arthritis   . Cancer ([place])    skin cancer  . COPD (chronic obstructive pulmonary disease) (HCC)    mild- no inhalers  . Dementia ([place])   . Depression   . Diabetes mellitus without complication ([place])   . 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 GERD (gastroesophageal reflux disease)   . Hemorrhoids   . History of kidney stones   . Hyperlipidemia   . Hypertension   . Leaky heart valve   . Paroxysmal SVT (supraventricular tachycardia) (HCC)    Past Surgical History:  Past Surgical History:  Procedure Laterality Date  . ABDOMINAL HYSTERECTOMY    . APPENDECTOMY  1945  . EYE SURGERY     cataract bil  . HEMORRHOID SURGERY    . JOINT REPLACEMENT Left 2008  . ORIF WRIST FRACTURE Right 11/06/2018   Procedure: OPEN REDUCTION INTERNAL FIXATION (ORIF) WRIST FRACTURE RIGHT - DIABETIC, SLEEP APNEA;  Surgeon: Hessie Knows, MD;  Location: [facility] ORS;  Service: Orthopedics;  Laterality: Right;   HPI:  86 y.o. female with multiple medical problems including type diabetes mellitus, hypertension, dyslipidemia, dementia, and COPD. Patient presented to ED from home with complaints of generalized weakness and altered mental status.Family reports patient has been experiencing recurrent falls over the last month which has resulted in a right wrist fracture s/p ORIF and in May 2021 a fall resulting in a subdural hemorrhage. Since admission patient initiated on IV antibiotics for bacteria  presence on UA. MRI of brain showed no acute abnormalities. Patient also with new onset atrial fibrillation with RVR.    Assessment / Plan / Recommendation Clinical Impression  At [place], pt relies on family for support with meals, full body bathing and they also have written directional cues on appliances such as the coffee maker and microwave. Pt continues to demonstrate overall cognitive improvement from time of admission. Per pt's daughter, current acute deficits include confusion about current location, pt messing with her leads d/t lack of understanding and pt was difficult to redirect during the night. Education provided to pt's daughter that the above deficits are very common in patients with dementia during their hospitalization. Examples provided by this writer about decreased familiar setting, no windows for environmental cues for day/night and lots of variable staff. All questions answered to daughter's satisfaction. ST intervention is not indicated at this time.     SLP Assessment  SLP Recommendation/Assessment: Patient does not need any further Speech Lanaguage Pathology Services SLP Visit Diagnosis: Cognitive communication deficit (R41.841)    Follow Up Recommendations  None    Frequency and Duration   N/A        SLP Evaluation Cognition  Overall Cognitive Status: History of cognitive impairments - at baseline Arousal/Alertness: Awake/alert Orientation Level: Oriented to person;Oriented to situation       Comprehension       Expression Verbal Expression Overall Verbal Expression: Appears within functional limits for tasks assessed   Oral / Motor  Oral Motor/Sensory Function Overall Oral Motor/Sensory  Function: Within functional limits Motor Speech Overall Motor Speech: Appears within functional limits for tasks assessed   GO                   [name] B. Rutherford Nail M.S., CCC-SLP, [place] Office [phone]  Stormy Fabian 04/12/2020, 4:06 PM

## 2020-04-12 NOTE — ED Notes (Signed)
Patient able to inform nurse that she has to go to the bathroom

## 2020-04-12 NOTE — Progress Notes (Signed)
Cross Cover Note  Patient with new onset afib RVR - confirmed by EKG BNP 326 D dimer 995.33  CXR The heart size and mediastinal contours are within normal limits. Both lungs are clear. The visualized skeletal structures are unremarkable. MPRESSION: No active disease.  TSH normal IV fluids discontinued CTA chest IMPRESSION: 1. No pulmonary embolus or other acute thoracic abnormality.  Aortic Atherosclerosis (ICD10-I70.0) and Emphysema (ICD10-J43.9).  Self converted back SR with rate in 80's and BP stable. -  further reconendatiosper cardioogy

## 2020-04-12 NOTE — Consult Note (Signed)
CARDIOLOGY CONSULT NOTE               Patient ID: Erica Donovan MRN: [id number] DOB/AGE: Jun 02, 1926 86 y.o.  Admit date: 04/10/2020 Referring Physician: Hosie Poisson, MD  Primary Physician: Juluis Pitch, MD  Primary Cardiologist: Bartholome Bill, MD  Reason for Consultation: Atrial Fibrillation with RVR  HPI: Erica Donovan is a 86 year old female with PMH significant for paroxysmal SVT, HTN, HLD, DM Type II, COPD and CKD stage II who presented to [facility] ED  with altered mental status. The patient was found to be febrile and hypertensive. ACS workup was unremarkable with high sensitivity troponin's 8>> and CXR was unremarkable. CT of the chest was negative for PE. MRI was negative for acute CVA. The patient was found to be in A-Fib with RVR and was initially treated with IV metoprolol without success; therefore, Cardiology was consulted for further evaluation.  The patient is only alert and oriented to self, thus the HPI was obtained from both the patient and daughter whom is at the bedside.   The patient's daughter reports that the patient has previously had an episodes of atrial fibrillation to her knowledge. She reports that the patient is AOx4 at baseline and that she has not mentioned having any chest pain or dyspnea previously; however, the patient has spoke to her about having dizziness and lightheadedness which has caused her to have multiple falls at home. The daughter reports that the patient experienced a severe fall about 5 months ago in which resulted in a subdural hematoma; therefore, she is reluctant to take any antiplatelets/anticoagulants. The patient reports that she is not in any pain at this time. The beside ECG reveals a-fib with RVR with a rate between 105-120's. The patient remains asymptomatic at this time. Her BNP is elevated at 326.6. The patient appears euvolemic, oxygen saturations are above 90% on room air and she is in no apparent distress at this time.    Review of systems complete and found to be negative unless listed above   Past Medical History:  Diagnosis Date  . Arthritis   . Cancer ([place])    skin cancer  . COPD (chronic obstructive pulmonary disease) (HCC)    mild- no inhalers  . Dementia ([place])   . Depression   . Diabetes mellitus without complication ([place])   . 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 GERD (gastroesophageal reflux disease)   . Hemorrhoids   . History of kidney stones   . Hyperlipidemia   . Hypertension   . Leaky heart valve   . Paroxysmal SVT (supraventricular tachycardia) (HCC)     Past Surgical History:  Procedure Laterality Date  . ABDOMINAL HYSTERECTOMY    . APPENDECTOMY  1945  . EYE SURGERY     cataract bil  . HEMORRHOID SURGERY    . JOINT REPLACEMENT Left 2008  . ORIF WRIST FRACTURE Right 11/06/2018   Procedure: OPEN REDUCTION INTERNAL FIXATION (ORIF) WRIST FRACTURE RIGHT - DIABETIC, SLEEP APNEA;  Surgeon: Hessie Knows, MD;  Location: [facility] ORS;  Service: Orthopedics;  Laterality: Right;    (Not in a hospital admission)  Social History   Socioeconomic History  . Marital status: Widowed    Spouse name: Not on file  . Number of children: Not on file  . Years of education: Not on file  . Highest education level: Not on file  Occupational History  . Not on file  Tobacco Use  . Smoking status: Former Smoker    Types: Cigarettes    Quit date: 11/04/1973    Years since quitting: 46.4  . Smokeless tobacco: Never Used  Vaping Use  . Vaping Use: Never used  Substance and Sexual Activity  . Alcohol use: Never  . Drug use: Never  . Sexual activity: Not on file  Other Topics Concern  . Not on file  Social History Narrative  . Not on file   Social Determinants of Health   Financial Resource Strain:   . Difficulty of Paying Living Expenses: Not on file  Food Insecurity:   . Worried About Charity fundraiser in the Last Year:  Not on file  . Ran Out of Food in the Last Year: Not on file  Transportation Needs:   . Lack of Transportation (Medical): Not on file  . Lack of Transportation (Non-Medical): Not on file  Physical Activity:   . Days of Exercise per Week: Not on file  . Minutes of Exercise per Session: Not on file  Stress:   . Feeling of Stress : Not on file  Social Connections:   . Frequency of Communication with Friends and Family: Not on file  . Frequency of Social Gatherings with Friends and Family: Not on file  . Attends Religious Services: Not on file  . Active Member of Clubs or Organizations: Not on file  . Attends Archivist Meetings: Not on file  . Marital Status: Not on file  Intimate Partner Violence:   . Fear of Current or Ex-Partner: Not on file  . Emotionally Abused: Not on file  . Physically Abused: Not on file  . Sexually Abused: Not on file    History reviewed. No pertinent family history.    Review of systems complete and found to be negative unless listed above   PHYSICAL EXAM  General: Well developed, well nourished, in no acute distress HEENT:  Normocephalic and atramatic; No JVD; supple; carotid +2 Lungs: Clear bilaterally to auscultation; chest expansion symmetrical Heart: irregular heart rate and rhythm . Normal S1 and S2 without gallops or murmurs.  Abdomen: Bowel sounds are positive, abdomen soft and non-tender  Msk:  Normal strength and tone for age. Extremities: No clubbing, cyanosis or edema.   Neuro: Alert and oriented X 1 Psych:  Good affect, responds appropriately  Labs:   Lab Results  Component Value Date   WBC 5.6 04/12/2020   HGB 11.1 (L) 04/12/2020   HCT 33.0 (L) 04/12/2020   MCV 98.5 04/12/2020   PLT 189 04/12/2020    Recent Labs  Lab 04/10/20 1755 04/11/20 0429 04/12/20 0056  NA 136   < > 136  K 4.3   < > 4.0  CL 98   < > 103  CO2 26   < > 24  BUN 24*   < > 19  CREATININE 1.25*   < > 1.18*  CALCIUM 10.0   < > 9.1  PROT 7.0   --   --   BILITOT 0.7  --   --   ALKPHOS 58  --   --   ALT 14  --   --   AST 21  --   --   GLUCOSE 114*   < > 109*   < > = values in this interval not displayed.   Lab Results  Component Value Date   CKTOTAL 29 03/02/2013   CKMB < 0.5 (L) 03/02/2013   TROPONINI <  0.02 03/02/2013    Lab Results  Component Value Date   CHOL 209 (H) 04/11/2020   Lab Results  Component Value Date   HDL 52 04/11/2020   Lab Results  Component Value Date   LDLCALC 136 (H) 04/11/2020   Lab Results  Component Value Date   TRIG 105 04/11/2020   Lab Results  Component Value Date   CHOLHDL 4.0 04/11/2020   No results found for: LDLDIRECT    Radiology: DG Chest 1 View  Result Date: 04/12/2020 CLINICAL DATA:  Tachycardia EXAM: CHEST  1 VIEW COMPARISON:  04/10/2020 FINDINGS: The heart size and mediastinal contours are within normal limits. Both lungs are clear. The visualized skeletal structures are unremarkable. IMPRESSION: No active disease. Electronically Signed   By: Ulyses Jarred M.D.   On: 04/12/2020 00:39   CT Head Wo Contrast  Result Date: 04/10/2020 CLINICAL DATA:  86 year old female with altered mental status. EXAM: CT HEAD WITHOUT CONTRAST TECHNIQUE: Contiguous axial images were obtained from the base of the skull through the vertex without intravenous contrast. COMPARISON:  Head CT dated 11/27/2019. FINDINGS: Brain: Moderate age-related atrophy and chronic microvascular ischemic changes. There is no acute intracranial hemorrhage. No mass effect or midline shift. No extra-axial fluid collection. Vascular: No hyperdense vessel or unexpected calcification. Skull: Normal. Negative for fracture or focal lesion. Sinuses/Orbits: No acute finding. Other: None IMPRESSION: 1. No acute intracranial pathology. 2. Moderate age-related atrophy and chronic microvascular ischemic changes. Electronically Signed   By: Anner Crete M.D.   On: 04/10/2020 21:44   CT ANGIO CHEST PE W OR WO CONTRAST  Result  Date: 04/12/2020 CLINICAL DATA:  Tachycardia and fever with elevated D-dimer EXAM: CT ANGIOGRAPHY CHEST WITH CONTRAST TECHNIQUE: Multidetector CT imaging of the chest was performed using the standard protocol during bolus administration of intravenous contrast. Multiplanar CT image reconstructions and MIPs were obtained to evaluate the vascular anatomy. CONTRAST:  63mL OMNIPAQUE IOHEXOL 350 MG/ML SOLN COMPARISON:  None. FINDINGS: Cardiovascular: Contrast injection is sufficient to demonstrate satisfactory opacification of the pulmonary arteries to the segmental level. There is no pulmonary embolus or evidence of right heart strain. The size of the main pulmonary artery is normal. Heart size is normal, with no pericardial effusion. The course and caliber of the aorta are normal. There is mild atherosclerotic calcification. Opacification decreased due to pulmonary arterial phase contrast bolus timing. Mediastinum/Nodes: No mediastinal, hilar or axillary lymphadenopathy. Normal visualized thyroid. Thoracic esophageal course is normal. Lungs/Pleura: Emphysema.  No pleural effusion. Upper Abdomen: Contrast bolus timing is not optimized for evaluation of the abdominal organs. The visualized portions of the organs of the upper abdomen are normal. Musculoskeletal: No chest wall abnormality. No bony spinal canal stenosis. Review of the MIP images confirms the above findings. IMPRESSION: 1. No pulmonary embolus or other acute thoracic abnormality. Aortic Atherosclerosis (ICD10-I70.0) and Emphysema (ICD10-J43.9). Electronically Signed   By: Ulyses Jarred M.D.   On: 04/12/2020 03:56   MR BRAIN WO CONTRAST  Result Date: 04/11/2020 CLINICAL DATA:  Initial evaluation for acute altered mental status, confusion. EXAM: MRI HEAD WITHOUT CONTRAST TECHNIQUE: Multiplanar, multiecho pulse sequences of the brain and surrounding structures were obtained without intravenous contrast. COMPARISON:  Prior head CT from 04/10/2020.  FINDINGS: Brain: Examination mildly degraded by motion artifact. Generalized age-related cerebral atrophy. Patchy and confluent T2/FLAIR hyperintensity within the periventricular deep Keriana Sarsfield matter both cerebral hemispheres most consistent with chronic small vessel ischemic disease, moderate in nature. No abnormal foci of restricted diffusion to suggest acute or  subacute ischemia. Gray-Merwin Breden matter differentiation maintained. No encephalomalacia to suggest chronic cortical infarction. No foci of susceptibility artifact to suggest acute or chronic intracranial hemorrhage. No mass lesion, midline shift or mass effect. No hydrocephalus or extra-axial fluid collection. Pituitary gland suprasellar region normal. Midline structures intact. Vascular: Major intracranial vascular flow voids are maintained. Skull and upper cervical spine: Craniocervical junction within normal limits. Bone marrow signal intensity normal. No scalp soft tissue abnormality. Sinuses/Orbits: Patient status post bilateral ocular lens replacement. Globes and orbital soft tissues demonstrate no acute finding. Mild scattered mucosal thickening noted within the ethmoidal air cells and right maxillary sinus. Paranasal sinuses are otherwise clear. No mastoid effusion. Inner ear structures grossly normal. Other: None. IMPRESSION: 1. No acute intracranial abnormality. 2. Age-related cerebral atrophy with moderate chronic microvascular ischemic disease. Electronically Signed   By: Jeannine Boga M.D.   On: 04/11/2020 01:44   US Carotid Bilateral (at [facility] and [facility] only)  Result Date: 04/11/2020 CLINICAL DATA:  86 year old female with confusion. EXAM: BILATERAL CAROTID DUPLEX ULTRASOUND TECHNIQUE: Pearline Cables scale imaging, color Doppler and duplex ultrasound were performed of bilateral carotid and vertebral arteries in the neck. COMPARISON:  03/03/2019 FINDINGS: Criteria: Quantification of carotid stenosis is based on velocity parameters that correlate the  residual internal carotid diameter with NASCET-based stenosis levels, using the diameter of the distal internal carotid lumen as the denominator for stenosis measurement. The following velocity measurements were obtained: RIGHT ICA: Peak systolic velocity 76 cm/sec, End diastolic velocity 17 cm/sec CCA: Peak systolic velocity 63 cm/sec SYSTOLIC ICA/CCA RATIO:  1.2 ECA: Peak systolic velocity 016 cm/sec LEFT ICA: Peak systolic velocity 89 cm/sec, End diastolic velocity 30 cm/sec CCA: 62 cm/sec SYSTOLIC ICA/CCA RATIO:  1.4 ECA: 92 cm/sec RIGHT CAROTID ARTERY: Minimal atherosclerotic plaque formation at the carotid bifurcation. No significant tortuosity. Normal low resistance waveforms. RIGHT VERTEBRAL ARTERY:  Antegrade flow. LEFT CAROTID ARTERY: Minimal atherosclerotic plaque formation at the carotid bifurcation. No significant tortuosity. Normal low resistance waveforms. LEFT VERTEBRAL ARTERY:  Antegrade flow. Upper extremity non-invasive blood pressures: Not obtained IMPRESSION: 1. Right carotid artery system: Less than 50% stenosis secondary to minimal atherosclerotic plaque formation. 2. Left carotid artery system: Less than 50% stenosis secondary to minimal atherosclerotic plaque formation. 3.  Vertebral artery system: Patent with antegrade flow bilaterally. Ruthann Cancer, MD [facility] Electronically Signed   By: Ruthann Cancer MD   On: 04/11/2020 07:30   US Venous Img Lower Bilateral (DVT)  Result Date: 04/12/2020 CLINICAL DATA:  Bilateral leg pain.  Elevated D-dimer. EXAM: BILATERAL LOWER EXTREMITY VENOUS DOPPLER ULTRASOUND TECHNIQUE: Gray-scale sonography with compression, as well as color and duplex ultrasound, were performed to evaluate the deep venous system(s) from the level of the common femoral vein through the popliteal and proximal calf veins. COMPARISON:  None. FINDINGS: BILATERAL VENOUS Normal compressibility of the common femoral,  superficial femoral, and popliteal veins, as well as the visualized calf veins. Visualized portions of profunda femoral vein and great saphenous vein unremarkable. No filling defects to suggest DVT on grayscale or color Doppler imaging. Doppler waveforms show normal direction of venous flow, normal respiratory plasticity and response to augmentation. OTHER Complex fluid collection in the right popliteal fossa measuring 3.6 x 1.1 x 2.0 cm, possibly representing a Bakers cyst. Limitations: none IMPRESSION: 1.  Negative for DVT in the bilateral lower extremities. 2. Complex collection in the right popliteal fossa measuring 3.6 cm, possibly a Bakers cyst. Electronically Signed   By: Jac Canavan.D.  On: 04/12/2020 11:41   DG Chest Portable 1 View  Result Date: 04/10/2020 CLINICAL DATA:  86 year old female with fever. EXAM: PORTABLE CHEST 1 VIEW COMPARISON:  Chest radiograph dated 03/02/2019. FINDINGS: No focal consolidation, pleural effusion, or pneumothorax. Stable cardiac silhouette. Coronary vascular calcification and atherosclerotic calcification of the aortic arch. No acute osseous pathology. IMPRESSION: No active disease. Electronically Signed   By: Anner Crete M.D.   On: 04/10/2020 18:23    EKG: Atrial Fibrillation with RVR   ASSESSMENT AND PLAN:   1.Atrial Fibrillation with RVR: fairly stable, patient is asymptomatic  -We will starting Amiodarone load orally with 200mg  BID as current US guided IV site questionable for infiltration.  -Agree with continuous Telemetry monitoring.   -Echocardiogram reveals normal LV systolic function with estimated EF of 55-60% with Grade I diastolic dysfunction and mild MS.   -CHA2DS@-VASc score of 5; however, due to h/o recent subdural hematoma, anticoagulation will not be started at this time.   2. HTN: stable at this time, patient is normotensive   - Continue Labetalol as needed.   -monitor VS q1h.  -low sodium, heart healthy diet.    3. HLD:  stable  -continue atorvastatin.   4. AMS with fever of unknown origin and Acute metabolic encephalopathy;   unclear etiology of CVA versus UTI;   -blood Cultures pending.   -Urine culture reveals multiple species.     5. Chronic partial thickness wound:    -Recommendations per WOC.  6. DM Type II: stable  -Agree with sliding scale insulin therapy and CBG monitoring.   7. Anemia: stable  -Continue to trend CBC.    Signed: Ranette Luckadoo ACNPC-AG 04/12/2020, 12:53 PM

## 2020-04-12 NOTE — Consult Note (Signed)
Consultation Note Date: 04/12/2020   Patient Name: Erica Donovan  DOB: 06/24/1926  MRN: [id number]  Age / Sex: 86 y.o., female   PCP: Juluis Pitch, MD Referring Physician: Hosie Poisson, MD   REASON FOR CONSULTATION:Establishing goals of care  Palliative Care consult requested for goals of care discussion in this 86 y.o. female with multiple medical problems including type diabetes mellitus, hypertension, dyslipidemia, dementia, and COPD. Patient presented to ED from home with complaints of generalized weakness and altered mental status.Family reports patient has been experiencing recurrent falls over the last month which has resulted in a right wrist fracture s/p ORIF and in May 2021 a fall resulting in a subdural hemorrhage. Since admission patient initiated on IV antibiotics for bacteria presence on UA. MRI of brain showed no acute abnormalities. Patient also with new onset atrial fibrillation with RVR.   Clinical Assessment and Goals of Care: I have reviewed medical records including lab results, imaging, Epic notes, and MAR, received report from the bedside RN, and assessed the patient. I met at the bedside with her daughter, Iran Sizer (daughter/POA)  to discuss diagnosis prognosis, GOC, EOL wishes, disposition and options.  Ms. Jarnagin is awake, alert and oriented to self and situation. She does answer some questions appropriately however noticeable intermittent confusion.   I introduced Palliative Medicine as specialized medical care for people living with serious illness. It focuses on providing relief from the symptoms and stress of a serious illness. The goal is to improve quality of life for both the patient and the family. Daughter verbalized understanding and appreciation.   We discussed a brief life review of the patient, along with her functional and nutritional status. Patient reports she currently lives alone, however daughter lives across the street. Patient has  7 children (2 deceased). 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   Prior to admission daughter reports patient was able to performs most ADLs independently, however with concerns of frequent falls. She is ambulatory with walker or holding on to wall/furniture. Daughter reports patient's appetite has decreased over the past month. She will often drink a boost daily. Given her dementia with some progression, family has been leaving ques around the home to reorient patient.    We discussed Her current illness and what it means in the larger context of Her on-going co-morbidities. With specific discussions regarding her new onset a-fib, dementia, weakness, recurrent falls, and overall functional and nutritional decline. Natural disease trajectory and expectations at EOL were discussed.  [name] verbalizes understanding. She expressed family is realistic in their expectations and knows that patient's health continues to decline as time goes by. Ms. Sando reports "I just want to get better and be comfortable if I can!" [name] verbalizes her agreement.   I attempted to elicit values and goals of care important to the patient.    The difference between aggressive medical intervention and comfort care was considered in light of the patient's goals of care.Daughter is clear in expressed goals that family would like to continue treating the treatable with work-ups and medications, however would not want extensive/aggressive interventions. She reports if patient was to have a bleed or clot they would not want to pursue aggressive interventions. She shares although they request work-up ultimately family is hopeful for some stabilization/improvement to allow comfort and eliminate unnecessary suffering. If patient was to further decline or show no signs of improvement family would then be  open to transitioning care to focus solely on comfort.   Advanced  directives, concepts specific to code status, artifical feeding and hydration, and rehospitalization were considered and discussed. Patient does have a documented advanced directive. Ms. Heatherly in addition to her daughter, confirms wishes for no artificial feeding/hydration, no aggressive interventions, and no heroic measures.  I created space and opportunity to further discuss patient's expressed wishes for no heroic measures.  I discussed at length patient's current full CODE STATUS with consideration of her current illness and comorbidities.  Patient states "I do not want to be put on any life support just let me go".  Daughter verbalizes understanding and agreement expressing wishes for DNR/DNI.  Education provided to daughter explaining what a DNR would look like for patient.  [name] and Ms. Foody both mutually confirmed and verbalized wishes for DNR.  Hospice and Palliative Care services outpatient were explained and offered. Patient and family verbalized their understanding and awareness of both palliative and hospice's goals and philosophy of care.  Daughter would like to continue with hospital course of treatment before making any final decisions regarding palliative versus hospice.  Questions and concerns were addressed. The family was encouraged to call with questions or concerns.  PMT will continue to support holistically.   SOCIAL HISTORY:     reports that she quit smoking about 46 years ago. Her smoking use included cigarettes. She has never used smokeless tobacco. She reports that she does not drink alcohol and does not use drugs.  CODE STATUS: DNR  ADVANCE DIRECTIVES: Primary Decision Maker: Lysbeth Penner (Daughter/HCPOA)    SYMPTOM MANAGEMENT: per attending   Palliative Prophylaxis:   Aspiration, Bowel Regimen, Delirium Protocol, Frequent Pain Assessment and Oral Care  PSYCHO-SOCIAL/SPIRITUAL:  Support System: Family  Desire for further Chaplaincy support:NO    Additional Recommendations (Limitations, Scope, Preferences):  No Artificial Feeding, No Surgical Procedures and treat the treatable, no aggressive interventions  Education on hospice/palliative    PAST MEDICAL HISTORY: Past Medical History:  Diagnosis Date  . Arthritis   . Cancer ([place])    skin cancer  . COPD (chronic obstructive pulmonary disease) ([facility])    mild- no inhalers  . Dementia ([place])   . Depression   . Diabetes mellitus without complication ([place])   . 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 GERD (gastroesophageal reflux disease)   . Hemorrhoids   . History of kidney stones   . Hyperlipidemia   . Hypertension   . Leaky heart valve   . Paroxysmal SVT (supraventricular tachycardia) ([facility])     ALLERGIES:  is allergic to penicillins, ciprofloxacin, and nitrofurantoin.   MEDICATIONS:  Current Facility-Administered Medications  Medication Dose Route Frequency Provider Last Rate Last Admin  .  stroke: mapping our early stages of recovery book   Does not apply Once Mansy, Jan A, MD      . 0.9 %  sodium chloride infusion   Intravenous Continuous Mansy, Arvella Merles, MD   Stopped at 04/12/20 0237  . acetaminophen (TYLENOL) tablet 650 mg  650 mg Oral Q4H PRN Mansy, Jan A, MD   650 mg at 04/11/20 0016   Or  . acetaminophen (TYLENOL) 160 MG/5ML solution 650 mg  650 mg Per Tube Q4H PRN Mansy, Jan A, MD       Or  . acetaminophen (TYLENOL) suppository 650 mg  650 mg Rectal Q4H PRN Mansy, Arvella Merles, MD      .  ascorbic acid (VITAMIN C) tablet 500 mg  500 mg Oral Daily Mansy, Jan A, MD   500 mg at 04/12/20 0957  . aspirin EC tablet 81 mg  81 mg Oral Daily Mansy, Jan A, MD   81 mg at 04/12/20 0953  . diphenhydrAMINE (BENADRYL) capsule 25 mg  25 mg Oral Q8H PRN Mansy, Jan A, MD      . docusate sodium (COLACE) capsule 200 mg  200 mg Oral Daily Mansy, Jan A, MD   200 mg at 04/12/20 0956  . enoxaparin (LOVENOX) injection 30 mg  30  mg Subcutaneous Q24H Mansy, Jan A, MD   30 mg at 04/12/20 0047  . escitalopram (LEXAPRO) tablet 20 mg  20 mg Oral BH-q7a Mansy, Jan A, MD   20 mg at 04/12/20 0900  . feeding supplement (ENSURE ENLIVE) (ENSURE ENLIVE) liquid 237 mL  237 mL Oral BID BM Mansy, Jan A, MD      . insulin aspart (novoLOG) injection 0-9 Units  0-9 Units Subcutaneous TID PC & HS Mansy, Jan A, MD   1 Units at 04/11/20 1732  . labetalol (NORMODYNE) injection 20 mg  20 mg Intravenous Q3H PRN Mansy, Jan A, MD      . loratadine (CLARITIN) tablet 10 mg  10 mg Oral Daily Mansy, Jan A, MD   10 mg at 04/12/20 0955  . LORazepam (ATIVAN) tablet 0.5 mg  0.5 mg Oral BID PRN Mansy, Jan A, MD   0.5 mg at 04/11/20 2233  . magnesium hydroxide (MILK OF MAGNESIA) suspension 30 mL  30 mL Oral Daily PRN Mansy, Jan A, MD      . metoprolol tartrate (LOPRESSOR) tablet 25 mg  25 mg Oral BID Hosie Poisson, MD   25 mg at 04/12/20 0956  . montelukast (SINGULAIR) tablet 10 mg  10 mg Oral QHS Mansy, Jan A, MD   10 mg at 04/11/20 2152  . multivitamin with minerals tablet 1 tablet  1 tablet Oral Daily Mansy, Jan A, MD   1 tablet at 04/12/20 0957  . omega-3 acid ethyl esters (LOVAZA) capsule 1 g  1 g Oral Daily Mansy, Jan A, MD   1 g at 04/11/20 1028  . ondansetron (ZOFRAN) tablet 4 mg  4 mg Oral Q6H PRN Mansy, Jan A, MD   4 mg at 04/11/20 2159   Or  . ondansetron [facility]) injection 4 mg  4 mg Intravenous Q6H PRN Mansy, Jan A, MD      . pantoprazole (PROTONIX) EC tablet 40 mg  40 mg Oral Daily Mansy, Jan A, MD   40 mg at 04/12/20 0955  . senna-docusate (Senokot-S) tablet 1 tablet  1 tablet Oral QHS PRN Mansy, Arvella Merles, MD       Current Outpatient Medications  Medication Sig Dispense Refill  . acetaminophen (TYLENOL) 500 MG tablet Take 500-1,000 mg by mouth every 6 (six) hours as needed for mild pain.    . diphenhydrAMINE (BENADRYL) 25 MG tablet Take 25 mg by mouth every 8 (eight) hours as needed for itching.    . docusate sodium (COLACE) 100 MG capsule  Take 200 mg by mouth daily. And prn    . escitalopram (LEXAPRO) 20 MG tablet Take 20 mg by mouth every morning.     . loratadine (CLARITIN) 10 MG tablet Take 10 mg by mouth every morning.    Marland Kitchen LORazepam (ATIVAN) 0.5 MG tablet Take 0.5 mg by mouth 2 (two) times daily as needed for anxiety.     Marland Kitchen  meloxicam (MOBIC) 7.5 MG tablet Take 7.5 mg by mouth as needed for pain.    . montelukast (SINGULAIR) 10 MG tablet Take 10 mg by mouth at bedtime.    . Multiple Vitamin (MULTIVITAMIN WITH MINERALS) TABS tablet Take 1 tablet by mouth daily.    . Omega-3 Fatty Acids (FISH OIL) 1000 MG CAPS Take 1,000 mg by mouth 3 (three) times daily.    Marland Kitchen omeprazole (PRILOSEC) 40 MG capsule Take 40 mg by mouth 2 (two) times a day.    . sulfamethoxazole-trimethoprim (BACTRIM DS) 800-160 MG tablet Take 1 tablet by mouth 2 (two) times daily.    Marland Kitchen aspirin EC 81 MG tablet Take 81 mg by mouth daily. (Patient not taking: Reported on 04/10/2020)    . bismuth subsalicylate (PEPTO BISMOL) 262 MG/15ML suspension Take 30 mLs by mouth every 6 (six) hours as needed for indigestion or diarrhea or loose stools.  (Patient not taking: Reported on 04/10/2020)    . carboxymethylcellulose (REFRESH PLUS) 0.5 % SOLN Place 1 drop into both eyes 3 (three) times daily as needed (dry eyes). (Patient not taking: Reported on 04/10/2020)    . cetirizine (ZYRTEC) 10 MG tablet Take 10 mg by mouth daily as needed for allergies.  (Patient not taking: Reported on 04/10/2020)    . feeding supplement, ENSURE ENLIVE, (ENSURE ENLIVE) LIQD Take 237 mLs by mouth 2 (two) times daily between meals. 237 mL 12  . HYDROcodone-acetaminophen (NORCO) 5-325 MG tablet Take 1 tablet by mouth every 6 (six) hours as needed. (Patient not taking: Reported on 03/02/2019) 20 tablet 0  . Sennosides (EX-LAX PO) Take by mouth as needed.    . vitamin C (ASCORBIC ACID) 500 MG tablet Take 500 mg by mouth as needed. (Patient not taking: Reported on 04/10/2020)      VITAL SIGNS: BP 113/85    Pulse 87   Temp 99.9 F (37.7 C) (Oral)   Resp (!) 25   Ht _0  (1.6 m)   Wt 73.6 kg   SpO2 93%   BMI 28.73 kg/m  Filed Weights   04/10/20 1720  Weight: 73.6 kg    Estimated body mass index is 28.73 kg/m as calculated from the following:   Height as of this encounter: _1  (1.6 m).   Weight as of this encounter: 73.6 kg.  LABS: CBC:    Component Value Date/Time   WBC 4.9 04/11/2020 0429   HGB 9.9 (L) 04/11/2020 0429   HGB 13.2 03/02/2013 1346   HCT 30.3 (L) 04/11/2020 0429   HCT 38.7 03/02/2013 1346   PLT 179 04/11/2020 0429   PLT 450 (H) 03/02/2013 1346   Comprehensive Metabolic Panel:    Component Value Date/Time   NA 136 04/12/2020 0056   NA 134 (L) 03/02/2013 1332   K 4.0 04/12/2020 0056   K 4.5 03/02/2013 1332   BUN 19 04/12/2020 0056   BUN 15 03/02/2013 1332   CREATININE 1.18 (H) 04/12/2020 0056   CREATININE 1.22 03/02/2013 1332   ALBUMIN 4.0 04/10/2020 1755   ALBUMIN 3.5 03/02/2013 1245     Review of Systems  Unable to perform ROS: Dementia  Neurological: Positive for weakness.   Physical Exam General: NAD, awake and alert to self and daughter (Pleasant) Cardiovascular: Irregular, tachy Pulmonary: cough with clear mucous, diminished bilaterally  Abdomen: soft, nontender, + bowel sounds Skin: no rashes, warm and dry Neurological: awake and alert to person and situation, mood appropriate, follows commands, answers some questions appropriately  Prognosis: Guarded   Discharge Planning:  To Be Determined  Recommendations: . DNR/DNI-as requested/confirmed by patient and her daughter/POA [name].  . No artificial feeding-PEG/no aggressive/invasive medical interventions . Continue with current plan of care per medical team . Family requesting to treat the treatable without interest in aggressive interventions.  Okay with minimal/less invasive work-up.  Daughter remains hopeful for some improvement/stability however if patient further declines or  shows minimal to no improvement family would then be open to transitioning care to focus more on comfort. Marland Kitchen PMT will continue to support and follow. Please call team line with urgent needs.   Palliative Performance Scale: PPS 30%              Patient and daughter expressed understanding and was in agreement with this plan.   Thank you for allowing the Palliative Medicine Team to assist in the care of this patient.  Time In: 1000 Time Out:1055 Time Total: 55 min.   Visit consisted of counseling and education dealing with the complex and emotionally intense issues of symptom management and palliative care in the setting of serious and potentially life-threatening illness.Greater than 50%  of this time was spent counseling and coordinating care related to the above assessment and plan.  Signed by:  Alda Lea, AGPCNP-BC Palliative Medicine Team  Phone: [phone] Pager: [phone] Amion: Bjorn Pippin

## 2020-04-12 NOTE — Progress Notes (Signed)
PT Cancellation Note  Patient Details Name: Erica Donovan MRN: [id number] DOB: 10-Oct-1925   Cancelled Treatment:    Reason Eval/Treat Not Completed: Other (comment);Medical issues which prohibited therapy (Per MD pt in afib with RVR, cardiology consult pending. PT to follow up as able.)   Lieutenant Diego PT, DPT 12:36 PM,04/12/20

## 2020-04-12 NOTE — ED Notes (Addendum)
Past 4 hrs pt's HR has been 105-135 (since approx 2230) and did not respond to metoprolol 5mg  at 2303 and 10mg  at 0005.  HR is now <100, currently 92.

## 2020-04-12 NOTE — Progress Notes (Signed)
VAST consulted to obtain IV access. Upon arrival to bedside, patient eating dinner. VAST RN will return to assess for IV placement.

## 2020-04-12 NOTE — Progress Notes (Signed)
PROGRESS NOTE    Erica Donovan  GDJ:[id number] DOB: 10/07/1925 DOA: 04/10/2020 PCP: Juluis Pitch, MD    Chief Complaint  Patient presents with  . Weakness  . Altered Mental Status    Brief Narrative:  86 year old lady with prior history of hypertension hyperlipidemia, COPD and diabetes presents with altered mental status, confusion and generalized weakness along with fever.  Abs were significant for AKI and urine was abnormal showing trace leukocytes and rare bacteria.  She was admitted for further evaluation of acute metabolic encephalopathy.    Assessment & Plan:   Active Problems:   AMS (altered mental status)   Acute encephalopathy  Acute metabolic encephalopathy Differential diagnosis UTI versus CVA MRI of the brain does not show any acute CVA.  Carotid duplex done showed Right carotid artery system: Less than 50% stenosis secondary to minimal atherosclerotic plaque formation. Left carotid artery system: Less than 50% stenosis secondary to minimal atherosclerotic plaque formation.  and 2D echocardiogram are pending at this time.   Therapy evaluations are recommending SNF, but daughter wishes to take the patient home.  Lipid panel shows LDL of 136, hemoglobin A1c is 6.1.   Urinary tract infection Urine cultures show multiple bacteria/species.    Essential hypertension Blood pressure parameters are optimal.   Type 2 diabetes mellitus well controlled CBG'S Continue with sliding scale insulin, A1c is 6.1% CBG (last 3)  Recent Labs    04/11/20 1728 04/11/20 2149 04/12/20 0825  GLUCAP 132* 98 110*     Acute metabolic encephalopathy:  Improving, she appears to be back to baseline. Pt at baseline has cognitive deficits. She is alert and oriented to person only.    Fever:  possibly from the UTI.  Blood cultures negative so far.  CXR is negative for pneumonia.  Urine cultures show multiple species.    New onset atrial fibrillation with RVR: Overnight  patient had multiple episodes of atrial fib with RVR, converted to sinus with metoprolol and back in A. fib this morning. Patient currently asymptomatic.  D-dimer elevated, CT angiogram of the chest negative for PE. Echocardiogram ordered and pending. Patient currently on metoprolol 25 mg twice daily. Patient's CHA2DS2-VASc score 5, Stroke risk was 7.2% per year . Cardiology consulted for recommendations.    Anemia: ? Anemia of acute illness.  Baseline hemoglobin around 12.  Dropped to 9.9 yesterday. Repeat CBC pending.  Anemia panel ordered and stool for occult blood ordered.     Chronic partial thickness wound to inner gluteal fold of buttocks possibly a chronic shear wound not pressure injury.  Wound care consulted , recommended foam dressing to buttocks, change every 3 days or prn for soiling.    DVT prophylaxis: Lovenox Code Status: DNR Family Communication: discussed with daughter at bedside.  Disposition:   Status is: Inpatient  The patient will require care spanning > 2 midnights and should be moved to inpatient because: Ongoing diagnostic testing needed not appropriate for outpatient work up  Dispo: The patient is from: Home              Anticipated d/c is to: Pending              Anticipated d/c date is: 1 day              Patient currently is not medically stable to d/c.       Consultants:   Cardiology.   Procedures: Ultrasound of the carotids, echocardiogram  Antimicrobials: IV Rocephin  Subjective: Pt appears comfortable, no  new complaints.   Objective: Vitals:   04/12/20 0730 04/12/20 0800 04/12/20 0830 04/12/20 0900  BP: 107/67 (!) 122/58 (!) 128/58 113/85  Pulse:      Resp:      Temp:      TempSrc:      SpO2:      Weight:      Height:        Intake/Output Summary (Last 24 hours) at 04/12/2020 1058 Last data filed at 04/12/2020 0900 Gross per 24 hour  Intake 210 ml  Output --  Net 210 ml   Filed Weights   04/10/20 1720  Weight: 73.6  kg    Examination:  General exam: Alert and comfortable, not in any kind of distress Respiratory system: Clear to auscultation bilaterally, no wheezing or rhonchi Cardiovascular system: S1-S2 heard, irregular, no JVD, no pedal edema Gastrointestinal system: Abdomen is soft, nontender, nondistended bowel sounds normal Central nervous system: Alert and oriented to person only grossly nonfocal Extremities: No pedal edema Skin: chronic shear tear in the back.  Psychiatry: Mood is appropriate    Data Reviewed: I have personally reviewed following labs and imaging studies  CBC: Recent Labs  Lab 04/10/20 1755 04/11/20 0429  WBC 7.3 4.9  NEUTROABS 5.9  --   HGB 12.4 9.9*  HCT 37.7 30.3*  MCV 99.0 99.7  PLT 227 932    Basic Metabolic Panel: Recent Labs  Lab 04/10/20 1755 04/11/20 0429 04/12/20 0056  NA 136 137 136  K 4.3 4.0 4.0  CL 98 106 103  CO2 26 23 24   GLUCOSE 114* 149* 109*  BUN 24* 22 19  CREATININE 1.25* 1.27* 1.18*  CALCIUM 10.0 8.8* 9.1  MG  --   --  1.7    GFR: Estimated Creatinine Clearance: 29.2 mL/min (A) (by C-G formula based on SCr of 1.18 mg/dL (H)).  Liver Function Tests: Recent Labs  Lab 04/10/20 1755  AST 21  ALT 14  ALKPHOS 58  BILITOT 0.7  PROT 7.0  ALBUMIN 4.0    CBG: Recent Labs  Lab 04/11/20 1110 04/11/20 1350 04/11/20 1728 04/11/20 2149 04/12/20 0825  GLUCAP 89 116* 132* 98 110*     Recent Results (from the past 240 hour(s))  Blood culture (routine x 2)     Status: None (Preliminary result)   Collection Time: 04/10/20  5:55 PM   Specimen: BLOOD  Result Value Ref Range Status   Specimen Description BLOOD RIGHT [facility]  Final   Special Requests   Final    BOTTLES DRAWN AEROBIC AND ANAEROBIC Blood Culture adequate volume   Culture   Final    NO GROWTH 2 DAYS Performed at [facility], [address] [place], [place] [address]    Report Status PENDING  Incomplete  Blood culture (routine x 2)     Status: None  (Preliminary result)   Collection Time: 04/10/20  5:55 PM   Specimen: BLOOD  Result Value Ref Range Status   Specimen Description BLOOD LEFT [facility]  Final   Special Requests   Final    BOTTLES DRAWN AEROBIC AND ANAEROBIC Blood Culture adequate volume   Culture   Final    NO GROWTH 2 DAYS Performed at [facility], [address] [place], [place] [address]    Report Status PENDING  Incomplete  Respiratory Panel by RT PCR (Flu A&B, Covid) - Nasopharyngeal Swab     Status: None   Collection Time: 04/10/20  5:55 PM   Specimen: Nasopharyngeal  Swab  Result Value Ref Range Status   SARS Coronavirus 2 by RT PCR NEGATIVE NEGATIVE Final    Comment: (NOTE) SARS-CoV-2 target nucleic acids are NOT DETECTED.  The SARS-CoV-2 RNA is generally detectable in upper respiratoy specimens during the acute phase of infection. The lowest concentration of SARS-CoV-2 viral copies this assay can detect is 131 copies/mL. A negative result does not preclude SARS-Cov-2 infection and should not be used as the sole basis for treatment or other patient management decisions. A negative result may occur with  improper specimen collection/handling, submission of specimen other than nasopharyngeal swab, presence of viral mutation(s) within the areas targeted by this assay, and inadequate number of viral copies (<131 copies/mL). A negative result must be combined with clinical observations, patient history, and epidemiological information. The expected result is Negative.  Fact Sheet for Patients:  PinkCheek.be  Fact Sheet for Healthcare Providers:  GravelBags.it  This test is no t yet approved or cleared by the [place] FDA and  has been authorized for detection and/or diagnosis of SARS-CoV-2 by FDA under an Emergency Use Authorization (EUA). This EUA will remain  in effect (meaning this test can be used) for the duration of the COVID-19  declaration under Section 564(b)(1) of the Act, 21 U.S.C. section 360bbb-3(b)(1), unless the authorization is terminated or revoked sooner.     Influenza A by PCR NEGATIVE NEGATIVE Final   Influenza B by PCR NEGATIVE NEGATIVE Final    Comment: (NOTE) The Xpert Xpress SARS-CoV-2/FLU/RSV assay is intended as an aid in  the diagnosis of influenza from Nasopharyngeal swab specimens and  should not be used as a sole basis for treatment. Nasal washings and  aspirates are unacceptable for Xpert Xpress SARS-CoV-2/FLU/RSV  testing.  Fact Sheet for Patients: PinkCheek.be  Fact Sheet for Healthcare Providers: GravelBags.it  This test is not yet approved or cleared by the [place] FDA and  has been authorized for detection and/or diagnosis of SARS-CoV-2 by  FDA under an Emergency Use Authorization (EUA). This EUA will remain  in effect (meaning this test can be used) for the duration of the  Covid-19 declaration under Section 564(b)(1) of the Act, 21  U.S.C. section 360bbb-3(b)(1), unless the authorization is  terminated or revoked. Performed at [facility], [address], [place], [place] [address]   Urine Culture     Status: Abnormal   Collection Time: 04/10/20  5:55 PM   Specimen: Urine, Random  Result Value Ref Range Status   Specimen Description   Final    URINE, RANDOM Performed at [facility], [address] [place], [place] [address]    Special Requests   Final    NONE Performed at [facility], [address] [place], [place] [address]    Culture MULTIPLE SPECIES PRESENT, SUGGEST RECOLLECTION (A)  Final   Report Status 04/11/2020 FINAL  Final         Radiology Studies: DG Chest 1 View  Result Date: 04/12/2020 CLINICAL DATA:  Tachycardia EXAM: CHEST  1 VIEW COMPARISON:  04/10/2020 FINDINGS: The heart size and mediastinal contours are within normal limits. Both lungs are  clear. The visualized skeletal structures are unremarkable. IMPRESSION: No active disease. Electronically Signed   By: Ulyses Jarred M.D.   On: 04/12/2020 00:39   CT Head Wo Contrast  Result Date: 04/10/2020 CLINICAL DATA:  86 year old female with altered mental status. EXAM: CT HEAD WITHOUT CONTRAST TECHNIQUE: Contiguous axial images were obtained from the base of the skull  through the vertex without intravenous contrast. COMPARISON:  Head CT dated 11/27/2019. FINDINGS: Brain: Moderate age-related atrophy and chronic microvascular ischemic changes. There is no acute intracranial hemorrhage. No mass effect or midline shift. No extra-axial fluid collection. Vascular: No hyperdense vessel or unexpected calcification. Skull: Normal. Negative for fracture or focal lesion. Sinuses/Orbits: No acute finding. Other: None IMPRESSION: 1. No acute intracranial pathology. 2. Moderate age-related atrophy and chronic microvascular ischemic changes. Electronically Signed   By: Anner Crete M.D.   On: 04/10/2020 21:44   CT ANGIO CHEST PE W OR WO CONTRAST  Result Date: 04/12/2020 CLINICAL DATA:  Tachycardia and fever with elevated D-dimer EXAM: CT ANGIOGRAPHY CHEST WITH CONTRAST TECHNIQUE: Multidetector CT imaging of the chest was performed using the standard protocol during bolus administration of intravenous contrast. Multiplanar CT image reconstructions and MIPs were obtained to evaluate the vascular anatomy. CONTRAST:  64mL OMNIPAQUE IOHEXOL 350 MG/ML SOLN COMPARISON:  None. FINDINGS: Cardiovascular: Contrast injection is sufficient to demonstrate satisfactory opacification of the pulmonary arteries to the segmental level. There is no pulmonary embolus or evidence of right heart strain. The size of the main pulmonary artery is normal. Heart size is normal, with no pericardial effusion. The course and caliber of the aorta are normal. There is mild atherosclerotic calcification. Opacification decreased due to  pulmonary arterial phase contrast bolus timing. Mediastinum/Nodes: No mediastinal, hilar or axillary lymphadenopathy. Normal visualized thyroid. Thoracic esophageal course is normal. Lungs/Pleura: Emphysema.  No pleural effusion. Upper Abdomen: Contrast bolus timing is not optimized for evaluation of the abdominal organs. The visualized portions of the organs of the upper abdomen are normal. Musculoskeletal: No chest wall abnormality. No bony spinal canal stenosis. Review of the MIP images confirms the above findings. IMPRESSION: 1. No pulmonary embolus or other acute thoracic abnormality. Aortic Atherosclerosis (ICD10-I70.0) and Emphysema (ICD10-J43.9). Electronically Signed   By: Ulyses Jarred M.D.   On: 04/12/2020 03:56   MR BRAIN WO CONTRAST  Result Date: 04/11/2020 CLINICAL DATA:  Initial evaluation for acute altered mental status, confusion. EXAM: MRI HEAD WITHOUT CONTRAST TECHNIQUE: Multiplanar, multiecho pulse sequences of the brain and surrounding structures were obtained without intravenous contrast. COMPARISON:  Prior head CT from 04/10/2020. FINDINGS: Brain: Examination mildly degraded by motion artifact. Generalized age-related cerebral atrophy. Patchy and confluent T2/FLAIR hyperintensity within the periventricular deep white matter both cerebral hemispheres most consistent with chronic small vessel ischemic disease, moderate in nature. No abnormal foci of restricted diffusion to suggest acute or subacute ischemia. Gray-white matter differentiation maintained. No encephalomalacia to suggest chronic cortical infarction. No foci of susceptibility artifact to suggest acute or chronic intracranial hemorrhage. No mass lesion, midline shift or mass effect. No hydrocephalus or extra-axial fluid collection. Pituitary gland suprasellar region normal. Midline structures intact. Vascular: Major intracranial vascular flow voids are maintained. Skull and upper cervical spine: Craniocervical junction within  normal limits. Bone marrow signal intensity normal. No scalp soft tissue abnormality. Sinuses/Orbits: Patient status post bilateral ocular lens replacement. Globes and orbital soft tissues demonstrate no acute finding. Mild scattered mucosal thickening noted within the ethmoidal air cells and right maxillary sinus. Paranasal sinuses are otherwise clear. No mastoid effusion. Inner ear structures grossly normal. Other: None. IMPRESSION: 1. No acute intracranial abnormality. 2. Age-related cerebral atrophy with moderate chronic microvascular ischemic disease. Electronically Signed   By: Jeannine Boga M.D.   On: 04/11/2020 01:44   US Carotid Bilateral (at [facility] and [facility] only)  Result Date: 04/11/2020 CLINICAL DATA:  86 year old female with confusion. EXAM: BILATERAL CAROTID DUPLEX  ULTRASOUND TECHNIQUE: Pearline Cables scale imaging, color Doppler and duplex ultrasound were performed of bilateral carotid and vertebral arteries in the neck. COMPARISON:  03/03/2019 FINDINGS: Criteria: Quantification of carotid stenosis is based on velocity parameters that correlate the residual internal carotid diameter with NASCET-based stenosis levels, using the diameter of the distal internal carotid lumen as the denominator for stenosis measurement. The following velocity measurements were obtained: RIGHT ICA: Peak systolic velocity 76 cm/sec, End diastolic velocity 17 cm/sec CCA: Peak systolic velocity 63 cm/sec SYSTOLIC ICA/CCA RATIO:  1.2 ECA: Peak systolic velocity 250 cm/sec LEFT ICA: Peak systolic velocity 89 cm/sec, End diastolic velocity 30 cm/sec CCA: 62 cm/sec SYSTOLIC ICA/CCA RATIO:  1.4 ECA: 92 cm/sec RIGHT CAROTID ARTERY: Minimal atherosclerotic plaque formation at the carotid bifurcation. No significant tortuosity. Normal low resistance waveforms. RIGHT VERTEBRAL ARTERY:  Antegrade flow. LEFT CAROTID ARTERY: Minimal atherosclerotic plaque formation at the carotid bifurcation. No significant tortuosity. Normal low resistance  waveforms. LEFT VERTEBRAL ARTERY:  Antegrade flow. Upper extremity non-invasive blood pressures: Not obtained IMPRESSION: 1. Right carotid artery system: Less than 50% stenosis secondary to minimal atherosclerotic plaque formation. 2. Left carotid artery system: Less than 50% stenosis secondary to minimal atherosclerotic plaque formation. 3.  Vertebral artery system: Patent with antegrade flow bilaterally. Ruthann Cancer, MD [facility] Radiology Electronically Signed   By: Ruthann Cancer MD   On: 04/11/2020 07:30   DG Chest Portable 1 View  Result Date: 04/10/2020 CLINICAL DATA:  86 year old female with fever. EXAM: PORTABLE CHEST 1 VIEW COMPARISON:  Chest radiograph dated 03/02/2019. FINDINGS: No focal consolidation, pleural effusion, or pneumothorax. Stable cardiac silhouette. Coronary vascular calcification and atherosclerotic calcification of the aortic arch. No acute osseous pathology. IMPRESSION: No active disease. Electronically Signed   By: Anner Crete M.D.   On: 04/10/2020 18:23        Scheduled Meds: .  stroke: mapping our early stages of recovery book   Does not apply Once  . vitamin C  500 mg Oral Daily  . aspirin EC  81 mg Oral Daily  . docusate sodium  200 mg Oral Daily  . enoxaparin (LOVENOX) injection  30 mg Subcutaneous Q24H  . escitalopram  20 mg Oral BH-q7a  . feeding supplement (ENSURE ENLIVE)  237 mL Oral BID BM  . insulin aspart  0-9 Units Subcutaneous TID PC & HS  . loratadine  10 mg Oral Daily  . metoprolol tartrate  25 mg Oral BID  . montelukast  10 mg Oral QHS  . multivitamin with minerals  1 tablet Oral Daily  . omega-3 acid ethyl esters  1 g Oral Daily  . pantoprazole  40 mg Oral Daily   Continuous Infusions: . sodium chloride Stopped (04/12/20 0237)     LOS: 1 day        Hosie Poisson, MD [facility]   To contact the attending provider between 7A-7P or the covering provider during after  hours 7P-7A, please log into the web site [email or web address] and access using universal [place] password for that web site. If you do not have the password, please call the hospital operator.  04/12/2020, 10:58 AM

## 2020-04-13 DIAGNOSIS — I4891 Unspecified atrial fibrillation: Secondary | ICD-10-CM

## 2020-04-13 DIAGNOSIS — R4182 Altered mental status, unspecified: Secondary | ICD-10-CM

## 2020-04-13 LAB — CBC WITH DIFFERENTIAL/PLATELET
Abs Immature Granulocytes: 0.02 10*3/uL (ref 0.00–0.07)
Basophils Absolute: 0 10*3/uL (ref 0.0–0.1)
Basophils Relative: 0 %
Eosinophils Absolute: 0.3 10*3/uL (ref 0.0–0.5)
Eosinophils Relative: 6 %
HCT: 33.3 % — ABNORMAL LOW (ref 36.0–46.0)
Hemoglobin: 11.2 g/dL — ABNORMAL LOW (ref 12.0–15.0)
Immature Granulocytes: 0 %
Lymphocytes Relative: 28 %
Lymphs Abs: 1.4 10*3/uL (ref 0.7–4.0)
MCH: 33 pg (ref 26.0–34.0)
MCHC: 33.6 g/dL (ref 30.0–36.0)
MCV: 98.2 fL (ref 80.0–100.0)
Monocytes Absolute: 0.6 10*3/uL (ref 0.1–1.0)
Monocytes Relative: 11 %
Neutro Abs: 2.7 10*3/uL (ref 1.7–7.7)
Neutrophils Relative %: 55 %
Platelets: 188 10*3/uL (ref 150–400)
RBC: 3.39 MIL/uL — ABNORMAL LOW (ref 3.87–5.11)
RDW: 12.5 % (ref 11.5–15.5)
WBC: 5.1 10*3/uL (ref 4.0–10.5)
nRBC: 0 % (ref 0.0–0.2)

## 2020-04-13 LAB — COMPREHENSIVE METABOLIC PANEL
ALT: 26 U/L (ref 0–44)
AST: 29 U/L (ref 15–41)
Albumin: 3.1 g/dL — ABNORMAL LOW (ref 3.5–5.0)
Alkaline Phosphatase: 62 U/L (ref 38–126)
Anion gap: 8 (ref 5–15)
BUN: 20 mg/dL (ref 8–23)
CO2: 26 mmol/L (ref 22–32)
Calcium: 9.7 mg/dL (ref 8.9–10.3)
Chloride: 103 mmol/L (ref 98–111)
Creatinine, Ser: 0.97 mg/dL (ref 0.44–1.00)
GFR, Estimated: 51 mL/min — ABNORMAL LOW (ref >60)
Glucose, Bld: 134 mg/dL — ABNORMAL HIGH (ref 70–99)
Potassium: 4.2 mmol/L (ref 3.5–5.1)
Sodium: 137 mmol/L (ref 135–145)
Total Bilirubin: 0.5 mg/dL (ref 0.3–1.2)
Total Protein: 5.8 g/dL — ABNORMAL LOW (ref 6.5–8.1)

## 2020-04-13 LAB — GLUCOSE, CAPILLARY
Glucose-Capillary: 94 mg/dL (ref 70–99)
Glucose-Capillary: 126 mg/dL — ABNORMAL HIGH (ref 70–99)
Glucose-Capillary: 145 mg/dL — ABNORMAL HIGH (ref 70–99)
Glucose-Capillary: 166 mg/dL — ABNORMAL HIGH (ref 70–99)

## 2020-04-13 LAB — AMMONIA: Ammonia: 28 umol/L (ref 9–35)

## 2020-04-13 MED ORDER — POLYVINYL ALCOHOL 1.4 % OP SOLN
1.0000 [drp] | OPHTHALMIC | Status: DC | PRN
  Administered 2020-04-14 – 2020-04-17 (×3): 1 [drp] via OPHTHALMIC
  Filled 2020-04-13 (×2): qty 15

## 2020-04-13 MED ORDER — METOPROLOL TARTRATE 25 MG PO TABS
25.0000 mg | ORAL_TABLET | Freq: Two times a day (BID) | ORAL | Status: DC
  Administered 2020-04-13 – 2020-04-14 (×4): 25 mg via ORAL
  Filled 2020-04-13 (×4): qty 1

## 2020-04-13 MED ORDER — ENOXAPARIN SODIUM 40 MG/0.4ML ~~LOC~~ SOLN
40.0000 mg | SUBCUTANEOUS | Status: DC
  Administered 2020-04-13 – 2020-04-14 (×2): 40 mg via SUBCUTANEOUS
  Filled 2020-04-13 (×2): qty 0.4

## 2020-04-13 MED ORDER — QUETIAPINE FUMARATE 25 MG PO TABS
12.5000 mg | ORAL_TABLET | Freq: Every day | ORAL | Status: DC
  Administered 2020-04-13 – 2020-04-17 (×5): 12.5 mg via ORAL
  Filled 2020-04-13 (×5): qty 1

## 2020-04-13 NOTE — Progress Notes (Signed)
Occupational Therapy Treatment Patient Details Name: Erica Donovan MRN: [id number] DOB: Dec 23, 1925 Today's Date: 04/13/2020    History of present illness Pt is a 86 yo female that presented to the ED for AMS per family, weakness, fever. Did experience nausea and vomiting as well.   PMH includes COPD, depression, dementia, DM, GERD, HLD, HTN as well as recent R wrist fracture and ORIF.   OT comments  Marianne Sofia participated int OT treatment this date. Upon arrival, pt is in bed, lethargic and confused. Oriented to self and place only. Daughter present in room and reports that since yesterday her mother has been very confused, as well as combative and verbally abusive, which daughter says is completely out of character for her. Pt reports no pain. She comes to EOB sit with Min A, able to sit for > 5 minutes, with posterior lean increasing with time. Pt able to reach beyond BOS, engage in game of "patty cake" for several minutes. Therapist provides cognitive re-orientation throughout session; with activity, pt becomes more engaged, able to carry on more appropriate conversation/displays less confusion. Pt expresses concern that gown does not cover her back and is eager to put on PJ bottoms. Able to thread BLE into pant legs with SUPV, requires MinA to pull up. Pt returns to bed with only CGA; following VC, able to adjust her position in bed and assist with pushing toward HOB. Daughter in room and interested in discussing DC recs -- SNF vs home with assistance. Pt continues to benefit from skilled OT services to maximize return to PLOF and minimize risk of future falls, injury, caregiver burden, and readmission. Will continue to follow POC. Discharge recommendation remains appropriate.    Follow Up Recommendations  SNF    Equipment Recommendations       Recommendations for Other Services      Precautions / Restrictions Precautions Precautions: Fall Restrictions Weight Bearing Restrictions:  No       Mobility Bed Mobility Overal bed mobility: Needs Assistance Bed Mobility: Supine to Sit;Sit to Supine     Supine to sit: Min assist Sit to supine: Min guard      Transfers Overall transfer level: Needs assistance Equipment used: Rolling walker (2 wheeled) Transfers: Sit to/from Stand Sit to Stand: Min assist              Balance Overall balance assessment: Needs assistance Sitting-balance support: Feet supported Sitting balance-Leahy Scale: Fair Sitting balance - Comments: after ~ 5 minutes EOB sitting, pt states she if very tired, posterior lean becomes more pronounced. Generally uses BUE support, but able to reach both hands up, play "patty cake" and still maintain sitting balance Postural control: Posterior lean Standing balance support: Bilateral upper extremity supported Standing balance-Leahy Scale: Poor                             ADL either performed or assessed with clinical judgement   ADL Overall ADL's : Needs assistance/impaired                     Lower Body Dressing: Minimal assistance Lower Body Dressing Details (indicate cue type and reason): Pt able to thread legs into PJ bottoms sitting EOB, needs slight assist in pulling up pants             Functional mobility during ADLs: Moderate assistance       Vision Baseline Vision/History: Wears glasses Wears Glasses: At  all times Patient Visual Report: No change from baseline     Perception     Praxis      Cognition Arousal/Alertness: Awake/alert Behavior During Therapy: WFL for tasks assessed/performed Overall Cognitive Status: History of cognitive impairments - at baseline                                 General Comments: Oriented to self and place        Exercises Other Exercises Other Exercises: LB dressing; educ re: EC, falls prevention, DC w/ pt and dtg; therex   Shoulder Instructions       General Comments Pt initially appeared  lethargic, disoriented; but with activity/conversation became much more engaged and appropriate    Pertinent Vitals/ Pain          Home Living                                          Prior Functioning/Environment              Frequency  Min 1X/week        Progress Toward Goals  OT Goals(current goals can now be found in the care plan section)  Progress towards OT goals: Progressing toward goals  Acute Rehab OT Goals Patient Stated Goal: to go home OT Goal Formulation: With patient/family Time For Goal Achievement: 04/25/20 Potential to Achieve Goals: Good  Plan Discharge plan remains appropriate    Co-evaluation                 AM-PAC OT "6 Clicks" Daily Activity     Outcome Measure   Help from another person eating meals?: A Little Help from another person taking care of personal grooming?: A Little Help from another person toileting, which includes using toliet, bedpan, or urinal?: A Little Help from another person bathing (including washing, rinsing, drying)?: A Lot Help from another person to put on and taking off regular upper body clothing?: A Little Help from another person to put on and taking off regular lower body clothing?: A Little 6 Click Score: 17    End of Session Equipment Utilized During Treatment: Rolling walker  OT Visit Diagnosis: Unsteadiness on feet (R26.81);Repeated falls (R29.6);Muscle weakness (generalized) (M62.81);Other symptoms and signs involving cognitive function   Activity Tolerance Patient tolerated treatment well   Patient Left in bed;with family/visitor present;with call bell/phone within reach;with bed alarm set   Nurse Communication          Time: [phone] OT Time Calculation (min): 23 min  Charges: OT General Charges $OT Visit: 1 Visit OT Treatments $Self Care/Home Management : 8-22 mins $Therapeutic Exercise: 8-22 mins   Josiah Lobo, PhD, [place], OTR/L ascom  [phone] 04/13/20, 11:05 AM

## 2020-04-13 NOTE — TOC Initial Note (Signed)
Transition of Care [facility]) - Initial/Assessment Note    Patient Details  Name: Erica Donovan MRN: [id number] Date of Birth: 17-Nov-1925  Transition of Care [facility]) CM/SW Contact:    Shelbie Ammons, RN Phone Number: 04/13/2020, 10:43 AM  Clinical Narrative:    RNCM met with patient and daughter in room, patient sleeping and assessment was conducted with daughter. Daughter, Erica Donovan reports that her mom lives at home alone but that she lives right across the road and is there to check on her daily. She reports that up until hospitalization her mom was relatively independent and able to take care of herself. She has noticed that since her mom came into the hospital her sun-downing is worse but she does seem to be making improvements in her mobility. Erica Donovan reports that she hopes patient will get strong enough to be able to go home but is agreeable to a bed search in the event it is necessary for her to go to SNF. She does not have preference as to the facility, only that it is a good one and would prefer it be closer to their home in [place].  RNCM started PASSR including faxing additional information, completed FL2 and started bed search.             Expected Discharge Plan: Skilled Nursing Facility Barriers to Discharge: Continued Medical Work up   Patient Goals and CMS Choice Patient states their goals for this hospitalization and ongoing recovery are:: Per daughter, to see her get better and be able to go home   Choice offered to / list presented to : [facility] POA / Guardian, Adult Children  Expected Discharge Plan and Services Expected Discharge Plan: [place]       Living arrangements for the past 2 months: Single Family Home                                      Prior Living Arrangements/Services Living arrangements for the past 2 months: Single Family Home Lives with:: Self Patient language and need for interpreter reviewed:: Yes Do you feel safe going back to  the place where you live?: Yes      Need for Family Participation in Patient Care: Yes (Comment) Care giver support system in place?: Yes (comment)   Criminal Activity/Legal Involvement Pertinent to Current Situation/Hospitalization: No - Comment as needed  Activities of Daily Living Home Assistive Devices/Equipment: Wheelchair, Environmental consultant (specify type), Raised toilet seat with rails, Grab bars in shower, Grab bars around toilet, Bedside commode/3-in-1, Eyeglasses, Dentures (specify type), Shower chair with back ADL Screening (condition at time of admission) Patient's cognitive ability adequate to safely complete daily activities?: Yes Is the patient deaf or have difficulty hearing?: No Does the patient have difficulty seeing, even when wearing glasses/contacts?: No Does the patient have difficulty concentrating, remembering, or making decisions?: Yes Patient able to express need for assistance with ADLs?: Yes Does the patient have difficulty dressing or bathing?: No Independently performs ADLs?: Yes (appropriate for developmental age) Does the patient have difficulty walking or climbing stairs?: Yes Weakness of Legs: Both Weakness of Arms/Hands: None  Permission Sought/Granted                  Emotional Assessment         Alcohol / Substance Use: Not Applicable Psych Involvement: No (comment)  Admission diagnosis:  Confusion [R41.0] Tachycardia [R00.0] Leg pain, bilateral [  M79.604, M79.605] Acute encephalopathy [G93.40] Altered mental status, unspecified altered mental status type [R41.82] AMS (altered mental status) [R41.82] Patient Active Problem List   Diagnosis Date Noted  . Acute encephalopathy 04/11/2020  . AMS (altered mental status) 04/10/2020  . Postural dizziness with near syncope 03/02/2019   PCP:  Juluis Pitch, MD Pharmacy:   [facility] [place], [place] [address]  [place] [address] Phone: [phone] Fax: [phone]     Social Determinants of Health (SDOH) Interventions    Readmission Risk Interventions No flowsheet data found.

## 2020-04-13 NOTE — Progress Notes (Signed)
[facility] Cardiology  Patient Description:Erica Donovan is a 86 year old female with PMH significant for paroxysmal SVT, HTN, HLD, DM Type II, COPD and CKD stage II who presented to [facility] ED  with altered mental status. The patient was found to be febrile and hypertensive. ACS workup was unremarkable with high sensitivity troponin's 8>> and CXR was unremarkable. CT of the chest was negative for PE. MRI was negative for acute CVA. The patient was found to be in A-Fib with RVR and was initially treated with IV metoprolol without success; therefore, Cardiology was consulted for further evaluation.  SUBJECTIVE: The patient reports that she is doing well on today and without any complaints at this time. The patient's daughter reports that the patient had a "rough" night. She reports that the pt became increasingly confused, having hallucinations and attempting to get out of bed. She also reports that the patient experienced some wheezing on last night and has had a poor appetite since her admission. The daughter expresses concerns over the patient's mental status because this is such a drastic change from her baseline cognitive status.   OBJECTIVE: The patient appears fairly well on today, is resting comfortably in bed and is in no apparent distress. The patient is Alert and oriented to herself and she in aware that she is in the hospital, but does not recall the name of the hospital or the city. The telemetry monitor reveals atrial fibrillation with a HR of 114 bpm and the patient is slightly hypertensive at this time.   Vitals:   04/12/20 2027 04/12/20 2356 04/13/20 0509 04/13/20 0752  BP: (!) 127/57 (!) 149/61 (!) 161/93 (!) 160/73  Pulse: 80 85 80 86  Resp: 16 (!) 24 16 18   Temp: 98.5 F (36.9 C) 98.5 F (36.9 C) 98.2 F (36.8 C) 98.8 F (37.1 C)  TempSrc: Oral Oral Oral Oral  SpO2: 96% 94% 98% 95%  Weight:      Height:         Intake/Output Summary (Last 24 hours) at 04/13/2020 0856 Last data  filed at 04/13/2020 0509 Gross per 24 hour  Intake 1483.33 ml  Output 0 ml  Net 1483.33 ml      PHYSICAL EXAM General: Well developed, well nourished, in no acute distress HEENT:  Normocephalic and atramatic; No JVD; supple; carotid +2 Lungs: Clear bilaterally to auscultation; chest expansion symmetrical Heart: irregular heart rate and rhythm . Normal S1 and S2 without gallops or murmurs.  Abdomen: Bowel sounds are positive, abdomen soft and non-tender  Msk:  Normal strength and tone for age. Extremities: No clubbing, cyanosis or edema.   Neuro: Alert and oriented X 1 Psych:  Good affect, responds appropriately   LABS: Basic Metabolic Panel: Recent Labs    04/11/20 0429 04/12/20 0056  NA 137 136  K 4.0 4.0  CL 106 103  CO2 23 24  GLUCOSE 149* 109*  BUN 22 19  CREATININE 1.27* 1.18*  CALCIUM 8.8* 9.1  MG  --  1.7   Liver Function Tests: Recent Labs    04/10/20 1755  AST 21  ALT 14  ALKPHOS 58  BILITOT 0.7  PROT 7.0  ALBUMIN 4.0   No results for input(s): LIPASE, AMYLASE in the last 72 hours. CBC: Recent Labs    04/10/20 1755 04/10/20 1755 04/11/20 0429 04/12/20 1144  WBC 7.3   < > 4.9 5.6  NEUTROABS 5.9  --   --   --   HGB 12.4   < > 9.9*  11.1*  HCT 37.7   < > 30.3* 33.0*  MCV 99.0   < > 99.7 98.5  PLT 227   < > 179 189   < > = values in this interval not displayed.   Cardiac Enzymes: No results for input(s): CKTOTAL, CKMB, CKMBINDEX, TROPONINI in the last 72 hours. BNP: Invalid input(s): POCBNP D-Dimer: No results for input(s): DDIMER in the last 72 hours. Hemoglobin A1C: Recent Labs    04/11/20 0429  HGBA1C 6.1*   Fasting Lipid Panel: Recent Labs    04/11/20 0429  CHOL 209*  HDL 52  LDLCALC 136*  TRIG 105  CHOLHDL 4.0   Thyroid Function Tests: Recent Labs    04/12/20 0056  TSH 1.989   Anemia Panel: Recent Labs    04/12/20 0056 04/12/20 1144  VITAMINB12  --  383  FOLATE 35.0  --   FERRITIN 76  --   TIBC 223*  --     IRON 21*  --   RETICCTPCT  --  1.8    DG Chest 1 View  Result Date: 04/12/2020 CLINICAL DATA:  Tachycardia EXAM: CHEST  1 VIEW COMPARISON:  04/10/2020 FINDINGS: The heart size and mediastinal contours are within normal limits. Both lungs are clear. The visualized skeletal structures are unremarkable. IMPRESSION: No active disease. Electronically Signed   By: Ulyses Jarred M.D.   On: 04/12/2020 00:39   CT ANGIO CHEST PE W OR WO CONTRAST  Result Date: 04/12/2020 CLINICAL DATA:  Tachycardia and fever with elevated D-dimer EXAM: CT ANGIOGRAPHY CHEST WITH CONTRAST TECHNIQUE: Multidetector CT imaging of the chest was performed using the standard protocol during bolus administration of intravenous contrast. Multiplanar CT image reconstructions and MIPs were obtained to evaluate the vascular anatomy. CONTRAST:  91mL OMNIPAQUE IOHEXOL 350 MG/ML SOLN COMPARISON:  None. FINDINGS: Cardiovascular: Contrast injection is sufficient to demonstrate satisfactory opacification of the pulmonary arteries to the segmental level. There is no pulmonary embolus or evidence of right heart strain. The size of the main pulmonary artery is normal. Heart size is normal, with no pericardial effusion. The course and caliber of the aorta are normal. There is mild atherosclerotic calcification. Opacification decreased due to pulmonary arterial phase contrast bolus timing. Mediastinum/Nodes: No mediastinal, hilar or axillary lymphadenopathy. Normal visualized thyroid. Thoracic esophageal course is normal. Lungs/Pleura: Emphysema.  No pleural effusion. Upper Abdomen: Contrast bolus timing is not optimized for evaluation of the abdominal organs. The visualized portions of the organs of the upper abdomen are normal. Musculoskeletal: No chest wall abnormality. No bony spinal canal stenosis. Review of the MIP images confirms the above findings. IMPRESSION: 1. No pulmonary embolus or other acute thoracic abnormality. Aortic Atherosclerosis  (ICD10-I70.0) and Emphysema (ICD10-J43.9). Electronically Signed   By: Ulyses Jarred M.D.   On: 04/12/2020 03:56   US Venous Img Lower Bilateral (DVT)  Result Date: 04/12/2020 CLINICAL DATA:  Bilateral leg pain.  Elevated D-dimer. EXAM: BILATERAL LOWER EXTREMITY VENOUS DOPPLER ULTRASOUND TECHNIQUE: Gray-scale sonography with compression, as well as color and duplex ultrasound, were performed to evaluate the deep venous system(s) from the level of the common femoral vein through the popliteal and proximal calf veins. COMPARISON:  None. FINDINGS: BILATERAL VENOUS Normal compressibility of the common femoral, superficial femoral, and popliteal veins, as well as the visualized calf veins. Visualized portions of profunda femoral vein and great saphenous vein unremarkable. No filling defects to suggest DVT on grayscale or color Doppler imaging. Doppler waveforms show normal direction of venous flow, normal respiratory plasticity and response  to augmentation. OTHER Complex fluid collection in the right popliteal fossa measuring 3.6 x 1.1 x 2.0 cm, possibly representing a Bakers cyst. Limitations: none IMPRESSION: 1.  Negative for DVT in the bilateral lower extremities. 2. Complex collection in the right popliteal fossa measuring 3.6 cm, possibly a Bakers cyst. Electronically Signed   By: Audie Pinto M.D.   On: 04/12/2020 11:41   ECHOCARDIOGRAM COMPLETE  Result Date: 04/12/2020    ECHOCARDIOGRAM REPORT   Patient Name:   RALIYAH MONTELLA [facility] Date of Exam: 04/11/2020 Medical Rec #:  [id number]       Height:       63.0 in Accession #:    [id number]      Weight:       162.2 lb Date of Birth:  [birth year]       BSA:          1.769 m Patient Age:    8 years        BP:           137/68 mmHg Patient Gender: F               HR:           85 bpm. Exam Location:  [facility] Procedure: 2D Echo, Cardiac Doppler and Color Doppler Indications:     TIA 435.9 / G45.9  History:         Patient has prior history of Echocardiogram  examinations. Risk                  Factors:Hypertension.  Sonographer:     Alyse Low Roar Referring Phys:  [id number] Arvella Merles MANSY Diagnosing Phys: Yolonda Kida MD IMPRESSIONS  1. Left ventricular ejection fraction, by estimation, is 55 to 60%. The left ventricle has normal function. The left ventricle has no regional wall motion abnormalities. Left ventricular diastolic parameters are consistent with Grade I diastolic dysfunction (impaired relaxation).  2. Right ventricular systolic function is normal. The right ventricular size is normal.  3. The mitral valve is abnormal. Trivial mitral valve regurgitation. Mild mitral stenosis.  4. The aortic valve is normal in structure. Aortic valve regurgitation is not visualized. FINDINGS  Left Ventricle: Left ventricular ejection fraction, by estimation, is 55 to 60%. The left ventricle has normal function. The left ventricle has no regional wall motion abnormalities. The left ventricular internal cavity size was normal in size. There is  borderline left ventricular hypertrophy. Left ventricular diastolic parameters are consistent with Grade I diastolic dysfunction (impaired relaxation). Right Ventricle: The right ventricular size is normal. No increase in right ventricular wall thickness. Right ventricular systolic function is normal. Left Atrium: Left atrial size was normal in size. Right Atrium: Right atrial size was normal in size. Pericardium: There is no evidence of pericardial effusion. Mitral Valve: The mitral valve is abnormal. There is mild thickening of the mitral valve leaflet(s). There is mild calcification of the mitral valve leaflet(s). Mild to moderate mitral annular calcification. Trivial mitral valve regurgitation. Mild mitral valve stenosis. Tricuspid Valve: The tricuspid valve is grossly normal. Tricuspid valve regurgitation is trivial. Aortic Valve: The aortic valve is normal in structure. Aortic valve regurgitation is not visualized. Aortic valve peak  gradient measures 12.5 mmHg. Pulmonic Valve: The pulmonic valve was normal in structure. Pulmonic valve regurgitation is not visualized. Aorta: Aortic root could not be assessed. IAS/Shunts: The interatrial septum was not well visualized.  LEFT VENTRICLE PLAX 2D LVIDd:         3.82  cm  Diastology LVIDs:         2.64 cm  LV e' medial:    7.83 cm/s LV PW:         1.22 cm  LV E/e' medial:  15.8 LV IVS:        1.42 cm  LV e' lateral:   6.09 cm/s LVOT diam:     1.70 cm  LV E/e' lateral: 20.4 LVOT Area:     2.27 cm  RIGHT VENTRICLE RV Mid diam:    3.14 cm RV S prime:     12.80 cm/s TAPSE (M-mode): 2.2 cm LEFT ATRIUM             Index       RIGHT ATRIUM           Index LA diam:        3.30 cm 1.87 cm/m  RA Area:     12.20 cm LA Vol (A2C):   58.1 ml 32.84 ml/m RA Volume:   24.20 ml  13.68 ml/m LA Vol (A4C):   36.4 ml 20.58 ml/m LA Biplane Vol: 46.9 ml 26.51 ml/m  AORTIC VALVE                PULMONIC VALVE AV Area (Vmax): 1.69 cm    PV Vmax:        1.00 m/s AV Vmax:        177.00 cm/s PV Peak grad:   4.0 mmHg AV Peak Grad:   12.5 mmHg   RVOT Peak grad: 3 mmHg LVOT Vmax:      132.00 cm/s  AORTA Ao Root diam: 2.80 cm MITRAL VALVE                TRICUSPID VALVE MV Area (PHT): 4.17 cm     TR Peak grad:   38.9 mmHg MV Decel Time: 182 msec     TR Vmax:        312.00 cm/s MV E velocity: 124.00 cm/s MV A velocity: 98.50 cm/s   SHUNTS MV E/A ratio:  1.26         Systemic Diam: 1.70 cm MV A Prime:    10.7 cm/s Dwayne D Callwood MD Electronically signed by Yolonda Kida MD Signature Date/Time: 04/12/2020/1:20:48 PM    Final        TELEMETRY: Atrial fibrillation with HR of 114  bpm  ASSESSMENT AND PLAN:  Active Problems:   AMS (altered mental status)   Acute encephalopathy  Atrial Fibrillation with RVR HTN HLD Chronic partial thickness wounds DM type II Anemia   1.Atrial Fibrillation with RVR: fairly stable, patient is asymptomatic             -continue Amiodarone load orally with 200mg  BID.  -re-start  metoprolol tartrate 25mg  BID.  -Monitor vss q4h.              -Agree with continuous Telemetry monitoring.              -Echocardiogram reveals normal LV systolic function with estimated EF of 55-60% with Grade I diastolic dysfunction and mild MS.              -CHA2DS@-VASc score of 5; however, due to h/o recent subdural hematoma, anticoagulation will not be started at this time.   -Recommend trending CMP and CBC  2. HTN: stable at this time, patient is normotensive              - Re-start Metoprolol tartrate.              -  monitor VS q4h.             -low sodium, heart healthy diet.               3. HLD: stable             -continue atorvastatin.   4. AMS with fever of unknown origin and Acute metabolic encephalopathy;   unclear etiology of CVA versus UTI;              -blood Cultures revealed no growth in 3 days.              -Urine culture reveals multiple species.                           5. Chronic partial thickness wound:                          -Recommendations per WOC.   6. DM Type II: stable             -Agree with sliding scale insulin therapy and CBG monitoring.   7. Anemia: stable             -Continue to trend CBC.     [place], ACNPC-AG  04/13/2020 8:56 AM

## 2020-04-13 NOTE — NC FL2 (Signed)
[place] LEVEL OF CARE SCREENING TOOL     IDENTIFICATION  Patient Name: Erica Donovan Birthdate: 20-Nov-1925 Sex: female Admission Date (Current Location): 04/10/2020  [place] and [place] Number:  Engineering geologist and Address:  [facility], [address] [place], [place] [address]      Provider Number: [id number]  Attending Physician Name and Address:  Kayleen Memos, DO  Relative Name and Phone Number:  Lysbeth Penner [phone]    Current Level of Care: Hospital Recommended Level of Care: [place] Prior Approval Number:    Date Approved/Denied:   PASRR Number:    Discharge Plan: SNF    Current Diagnoses: Patient Active Problem List   Diagnosis Date Noted  . Acute encephalopathy 04/11/2020  . AMS (altered mental status) 04/10/2020  . Postural dizziness with near syncope 03/02/2019    Orientation RESPIRATION BLADDER Height & Weight     Self, Time, Situation, Place  Normal Continent Weight: 73.6 kg Height:  5\' 3"  (160 cm)  BEHAVIORAL SYMPTOMS/MOOD NEUROLOGICAL BOWEL NUTRITION STATUS      Continent Diet (Carb Modified)  AMBULATORY STATUS COMMUNICATION OF NEEDS Skin   Limited Assist Verbally Normal                       Personal Care Assistance Level of Assistance  Bathing, Feeding, Dressing Bathing Assistance: Limited assistance Feeding assistance: Independent Dressing Assistance: Limited assistance     Functional Limitations Info  Sight, Hearing, Speech Sight Info: Adequate Hearing Info: Adequate Speech Info: Adequate    SPECIAL CARE FACTORS FREQUENCY  PT (By licensed PT), OT (By licensed OT)                    Contractures Contractures Info: Not present    Additional Factors Info  Code Status, Allergies Code Status Info: DNR Allergies Info: Penicillins, Ciprofloxacin, Nitrofurantoin           Current Medications (04/13/2020):  This is the current hospital active  medication list Current Facility-Administered Medications  Medication Dose Route Frequency Provider Last Rate Last Admin  . 0.9 %  sodium chloride infusion   Intravenous Continuous Mansy, Arvella Merles, MD   Stopped at 04/12/20 0237  . acetaminophen (TYLENOL) tablet 650 mg  650 mg Oral Q4H PRN Mansy, Jan A, MD   650 mg at 04/11/20 0016   Or  . acetaminophen (TYLENOL) 160 MG/5ML solution 650 mg  650 mg Per Tube Q4H PRN Mansy, Jan A, MD       Or  . acetaminophen (TYLENOL) suppository 650 mg  650 mg Rectal Q4H PRN Mansy, Jan A, MD      . amiodarone (PACERONE) tablet 200 mg  200 mg Oral BID White, Delicia, NP   588 mg at 04/13/20 0915  . ascorbic acid (VITAMIN C) tablet 500 mg  500 mg Oral Daily Mansy, Jan A, MD   500 mg at 04/13/20 0915  . aspirin EC tablet 81 mg  81 mg Oral Daily Mansy, Jan A, MD   81 mg at 04/13/20 0914  . atorvastatin (LIPITOR) tablet 20 mg  20 mg Oral Daily Hosie Poisson, MD   20 mg at 04/12/20 2100  . diphenhydrAMINE (BENADRYL) capsule 25 mg  25 mg Oral Q8H PRN Mansy, Jan A, MD      . docusate sodium (COLACE) capsule 200 mg  200 mg Oral Daily Mansy, Jan A, MD   200 mg at 04/13/20 0914  .  enoxaparin (LOVENOX) injection 30 mg  30 mg Subcutaneous Q24H Mansy, Jan A, MD   30 mg at 04/12/20 2112  . escitalopram (LEXAPRO) tablet 20 mg  20 mg Oral BH-q7a Mansy, Jan A, MD   20 mg at 04/13/20 0914  . feeding supplement (ENSURE ENLIVE) (ENSURE ENLIVE) liquid 237 mL  237 mL Oral BID BM Mansy, Jan A, MD      . ferrous sulfate tablet 325 mg  325 mg Oral BID WC Hosie Poisson, MD   325 mg at 04/13/20 0914  . insulin aspart (novoLOG) injection 0-9 Units  0-9 Units Subcutaneous TID PC & HS Mansy, Jan A, MD   1 Units at 04/12/20 2112  . labetalol (NORMODYNE) injection 20 mg  20 mg Intravenous Q3H PRN Mansy, Jan A, MD      . loratadine (CLARITIN) tablet 10 mg  10 mg Oral Daily Mansy, Jan A, MD   10 mg at 04/13/20 0915  . LORazepam (ATIVAN) tablet 0.5 mg  0.5 mg Oral BID PRN Mansy, Jan A, MD   0.5 mg at  04/12/20 2100  . magnesium hydroxide (MILK OF MAGNESIA) suspension 30 mL  30 mL Oral Daily PRN Mansy, Jan A, MD      . metoprolol tartrate (LOPRESSOR) tablet 25 mg  25 mg Oral BID White, Delicia, NP   25 mg at 04/13/20 0914  . montelukast (SINGULAIR) tablet 10 mg  10 mg Oral QHS Mansy, Jan A, MD   10 mg at 04/12/20 2100  . multivitamin with minerals tablet 1 tablet  1 tablet Oral Daily Mansy, Jan A, MD   1 tablet at 04/13/20 0915  . omega-3 acid ethyl esters (LOVAZA) capsule 1 g  1 g Oral Daily Mansy, Jan A, MD   1 g at 04/13/20 0914  . ondansetron (ZOFRAN) tablet 4 mg  4 mg Oral Q6H PRN Mansy, Jan A, MD   4 mg at 04/11/20 2159   Or  . ondansetron [facility]) injection 4 mg  4 mg Intravenous Q6H PRN Mansy, Jan A, MD      . pantoprazole (PROTONIX) EC tablet 40 mg  40 mg Oral Daily Mansy, Jan A, MD   40 mg at 04/13/20 0915  . senna-docusate (Senokot-S) tablet 1 tablet  1 tablet Oral QHS PRN Mansy, Arvella Merles, MD         Discharge Medications: Please see discharge summary for a list of discharge medications.  Relevant Imaging Results:  Relevant Lab Results:   Additional Information SS# [id number]  Shelbie Ammons, RN

## 2020-04-13 NOTE — Consult Note (Signed)
Reason for Consult:AMS Requesting Physician: Nevada Crane  CC: AMS  I have been asked by Dr. Nevada Crane to see this patient in consultation for AMS.  HPI: Erica Donovan is an 86 y.o. female with a history of multiple medical problems including HTN, HLD, DM, paroxysmal SVT and informal diagnosis of dementia.  Patient given informal diagnosis quite a few years ago but family declined formal evaluation at that time.  Has had gradual and progressive deterioration since that time.  Patient lives alone but has required increased amount of support over that period of time.  Has had increased fall with a fractured wrist incurred last year and a SDH.  Has had a more precipitous decline since wrist fracture.  Has been noted to have period of agitation, paranoia and hallucinations as well.  On the day of admission was noted to be more confused.  Became acutely weak and was unable to help with transfers.  Was brought in for evaluation at that time.  Had low grade temp and questionable UTI with elevated creatinine above baseline.  Patient started on Rocephin and has had improvement but noted to be more confused at times, particularly at night.  Has also been complaining of more headaches and neck pain recently.    Past Medical History:  Diagnosis Date  . Arthritis   . Cancer ([place])    skin cancer  . COPD (chronic obstructive pulmonary disease) (HCC)    mild- no inhalers  . Dementia ([place])   . Depression   . Diabetes mellitus without complication ([place])   . 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 GERD (gastroesophageal reflux disease)   . Hemorrhoids   . History of kidney stones   . Hyperlipidemia   . Hypertension   . Leaky heart valve   . Paroxysmal SVT (supraventricular tachycardia) (HCC)     Past Surgical History:  Procedure Laterality Date  . ABDOMINAL HYSTERECTOMY    . APPENDECTOMY  1945  . EYE SURGERY     cataract bil  . HEMORRHOID SURGERY     . JOINT REPLACEMENT Left 2008  . ORIF WRIST FRACTURE Right 11/06/2018   Procedure: OPEN REDUCTION INTERNAL FIXATION (ORIF) WRIST FRACTURE RIGHT - DIABETIC, SLEEP APNEA;  Surgeon: Hessie Knows, MD;  Location: [facility] ORS;  Service: Orthopedics;  Laterality: Right;    History reviewed. No pertinent family history.  Social History:  reports that she quit smoking about 46 years ago. Her smoking use included cigarettes. She has never used smokeless tobacco. She reports that she does not drink alcohol and does not use drugs.  Allergies  Allergen Reactions  . Penicillins Nausea Only    Did it involve swelling of the face/tongue/throat, SOB, or low BP? Unknown Did it involve sudden or severe rash/hives, skin peeling, or any reaction on the inside of your mouth or nose? Unknown Did you need to seek medical attention at a hospital or doctor's office? Unknown When did it last happen?Many years ago. If all above answers are "NO", may proceed with cephalosporin use.   . Ciprofloxacin Nausea Only  . Nitrofurantoin Itching and Rash    Medications:  I have reviewed the patient's current medications. Prior to Admission:  Medications Prior to Admission  Medication Sig Dispense Refill Last Dose  . acetaminophen (TYLENOL) 500 MG tablet Take 500-1,000 mg by mouth every 6 (six) hours as needed for mild pain.   prn at prn  . diphenhydrAMINE (BENADRYL)  25 MG tablet Take 25 mg by mouth every 8 (eight) hours as needed for itching.   prn at prn  . docusate sodium (COLACE) 100 MG capsule Take 200 mg by mouth daily. And prn   04/10/2020 at Unknown time  . escitalopram (LEXAPRO) 20 MG tablet Take 20 mg by mouth every morning.    04/10/2020 at Unknown  . loratadine (CLARITIN) 10 MG tablet Take 10 mg by mouth every morning.   04/10/2020 at Unknown  . LORazepam (ATIVAN) 0.5 MG tablet Take 0.5 mg by mouth 2 (two) times daily as needed for anxiety.    prn at prn  . meloxicam (MOBIC) 7.5 MG tablet Take 7.5 mg by  mouth as needed for pain.   prn at prn  . montelukast (SINGULAIR) 10 MG tablet Take 10 mg by mouth at bedtime.   Unknown at Unknown  . Multiple Vitamin (MULTIVITAMIN WITH MINERALS) TABS tablet Take 1 tablet by mouth daily.   04/10/2020 at Unknown time  . Omega-3 Fatty Acids (FISH OIL) 1000 MG CAPS Take 1,000 mg by mouth 3 (three) times daily.   04/10/2020 at Unknown  . omeprazole (PRILOSEC) 40 MG capsule Take 40 mg by mouth 2 (two) times a day.   04/10/2020 at Unknown time  . sulfamethoxazole-trimethoprim (BACTRIM DS) 800-160 MG tablet Take 1 tablet by mouth 2 (two) times daily.   04/10/2020 at Unknown time  . aspirin EC 81 MG tablet Take 81 mg by mouth daily. (Patient not taking: Reported on 04/10/2020)   Not Taking at Unknown time  . bismuth subsalicylate (PEPTO BISMOL) 262 MG/15ML suspension Take 30 mLs by mouth every 6 (six) hours as needed for indigestion or diarrhea or loose stools.  (Patient not taking: Reported on 04/10/2020)   Not Taking at Unknown time  . carboxymethylcellulose (REFRESH PLUS) 0.5 % SOLN Place 1 drop into both eyes 3 (three) times daily as needed (dry eyes). (Patient not taking: Reported on 04/10/2020)   Not Taking at Unknown time  . cetirizine (ZYRTEC) 10 MG tablet Take 10 mg by mouth daily as needed for allergies.  (Patient not taking: Reported on 04/10/2020)   Not Taking at Unknown time  . feeding supplement, ENSURE ENLIVE, (ENSURE ENLIVE) LIQD Take 237 mLs by mouth 2 (two) times daily between meals. 237 mL 12   . HYDROcodone-acetaminophen (NORCO) 5-325 MG tablet Take 1 tablet by mouth every 6 (six) hours as needed. (Patient not taking: Reported on 03/02/2019) 20 tablet 0 Not Taking at Unknown time  . Sennosides (EX-LAX PO) Take by mouth as needed.   prn at prn  . vitamin C (ASCORBIC ACID) 500 MG tablet Take 500 mg by mouth as needed. (Patient not taking: Reported on 04/10/2020)   Not Taking at Unknown time   Scheduled: . amiodarone  200 mg Oral BID  . vitamin C  500 mg  Oral Daily  . aspirin EC  81 mg Oral Daily  . atorvastatin  20 mg Oral Daily  . docusate sodium  200 mg Oral Daily  . enoxaparin (LOVENOX) injection  30 mg Subcutaneous Q24H  . escitalopram  20 mg Oral BH-[id number]  . feeding supplement  237 mL Oral BID BM  . ferrous sulfate  325 mg Oral BID WC  . insulin aspart  0-9 Units Subcutaneous TID PC & HS  . loratadine  10 mg Oral Daily  . metoprolol tartrate  25 mg Oral BID  . montelukast  10 mg Oral QHS  . multivitamin with minerals  1 tablet Oral Daily  . omega-3 acid ethyl esters  1 g Oral Daily  . pantoprazole  40 mg Oral Daily  . QUEtiapine  12.5 mg Oral QHS    ROS: History obtained from the patient and daughter  General ROS: poor appetite Psychological ROS: as noted in HPI Ophthalmic ROS: negative for - blurry vision, double vision, eye pain or loss of vision ENT ROS: negative for - epistaxis, nasal discharge, oral lesions, sore throat, tinnitus or vertigo Allergy and Immunology ROS: negative for - hives or itchy/watery eyes Hematological and Lymphatic ROS: negative for - bleeding problems, bruising or swollen lymph nodes Endocrine ROS: negative for - galactorrhea, hair pattern changes, polydipsia/polyuria or temperature intolerance Respiratory ROS: negative for - cough, hemoptysis, shortness of breath or wheezing Cardiovascular ROS: negative for - chest pain, dyspnea on exertion, edema or irregular heartbeat Gastrointestinal ROS: negative for - abdominal pain, diarrhea, hematemesis, nausea/vomiting or stool incontinence Genito-Urinary ROS: negative for - dysuria, hematuria, incontinence or urinary frequency/urgency Musculoskeletal ROS: negative for - joint swelling or muscular weakness Neurological ROS: as noted in HPI Dermatological ROS: pressure ulcers  Physical Examination: Blood pressure (!) 160/73, pulse 86, temperature 98.8 F (37.1 C), temperature source Oral, resp. rate 18, height 5\' 3"  (1.6 m), weight 73.6 kg, SpO2 95  %.  HEENT-  Normocephalic, no lesions, without obvious abnormality.  Normal external eye and conjunctiva.  Normal TM's bilaterally.  Normal auditory canals and external ears. Normal external nose, mucus membranes and septum.  Normal pharynx. Cardiovascular- S1, S2 normal, pulses palpable throughout   Lungs- chest clear, no wheezing, rales, normal symmetric air entry Abdomen- soft, non-tender; bowel sounds normal; no masses,  no organomegaly Extremities- no edema Lymph-no adenopathy palpable Musculoskeletal-no joint tenderness, deformity or swelling Skin-warm and dry, no hyperpigmentation, vitiligo, or suspicious lesions  Neurological Examination   Mental Status: Alert, oriented to name and place.  Able to tell me her daughter's full name and her middle name.  Unable to tell me the year.  Speech fluent without evidence of aphasia.  Able to follow 3 step commands with some mild reinforcement. Cranial Nerves: II: Visual fields grossly normal, pupils equal, round, reactive to light and accommodation III,IV, VI: ptosis not present, extra-ocular motions intact bilaterally V,VII: smile symmetric, facial light touch sensation normal bilaterally VIII: hearing normal bilaterally IX,X: gag reflex present XI: bilateral shoulder shrug XII: midline tongue extension Motor: Right : Upper extremity   5/5    Left:     Upper extremity   5/5  Lower extremity   5/5     Lower extremity   5/5 Tone and bulk:normal tone throughout; no atrophy noted Sensory: Pinprick and light touch intact throughout, bilaterally Deep Tendon Reflexes: Symmetric throughout Plantars: Right: mute   Left: mute Cerebellar: Normal finger-to-nose and normal heel-to-shin testing bilaterally Gait: not tested due to safety concerns    Laboratory Studies:   Basic Metabolic Panel: Recent Labs  Lab 04/10/20 1755 04/10/20 1755 04/11/20 0429 04/12/20 0056 04/13/20 1136  NA 136  --  137 136 137  K 4.3  --  4.0 4.0 4.2  CL 98  --   106 103 103  CO2 26  --  23 24 26   GLUCOSE 114*  --  149* 109* 134*  BUN 24*  --  22 19 20   CREATININE 1.25*  --  1.27* 1.18* 0.97  CALCIUM 10.0   < > 8.8* 9.1 9.7  MG  --   --   --  1.7  --    < > =  values in this interval not displayed.    Liver Function Tests: Recent Labs  Lab 04/10/20 1755 04/13/20 1136  AST 21 29  ALT 14 26  ALKPHOS 58 62  BILITOT 0.7 0.5  PROT 7.0 5.8*  ALBUMIN 4.0 3.1*   No results for input(s): LIPASE, AMYLASE in the last 168 hours. Recent Labs  Lab 04/13/20 1136  AMMONIA 28    CBC: Recent Labs  Lab 04/10/20 1755 04/11/20 0429 04/12/20 1144 04/13/20 1136  WBC 7.3 4.9 5.6 5.1  NEUTROABS 5.9  --   --  2.7  HGB 12.4 9.9* 11.1* 11.2*  HCT 37.7 30.3* 33.0* 33.3*  MCV 99.0 99.7 98.5 98.2  PLT 227 179 189 188    Cardiac Enzymes: No results for input(s): CKTOTAL, CKMB, CKMBINDEX, TROPONINI in the last 168 hours.  BNP: Invalid input(s): POCBNP  CBG: Recent Labs  Lab 04/12/20 1311 04/12/20 1629 04/12/20 2109 04/13/20 0753 04/13/20 1137  GLUCAP 112* 100* 127* 94 126*    Microbiology: Results for orders placed or performed during the hospital encounter of 04/10/20  Blood culture (routine x 2)     Status: None (Preliminary result)   Collection Time: 04/10/20  5:55 PM   Specimen: BLOOD  Result Value Ref Range Status   Specimen Description BLOOD RIGHT [facility]  Final   Special Requests   Final    BOTTLES DRAWN AEROBIC AND ANAEROBIC Blood Culture adequate volume   Culture   Final    NO GROWTH 3 DAYS Performed at [facility], [address], [place], [place] [address]    Report Status PENDING  Incomplete  Blood culture (routine x 2)     Status: None (Preliminary result)   Collection Time: 04/10/20  5:55 PM   Specimen: BLOOD  Result Value Ref Range Status   Specimen Description BLOOD LEFT AC  Final   Special Requests   Final    BOTTLES DRAWN AEROBIC AND ANAEROBIC Blood Culture adequate volume   Culture   Final    NO  GROWTH 3 DAYS Performed at [facility], [address] [place], [place] [address]    Report Status PENDING  Incomplete  Respiratory Panel by RT PCR (Flu A&B, Covid) - Nasopharyngeal Swab     Status: None   Collection Time: 04/10/20  5:55 PM   Specimen: Nasopharyngeal Swab  Result Value Ref Range Status   SARS Coronavirus 2 by RT PCR NEGATIVE NEGATIVE Final    Comment: (NOTE) SARS-CoV-2 target nucleic acids are NOT DETECTED.  The SARS-CoV-2 RNA is generally detectable in upper respiratoy specimens during the acute phase of infection. The lowest concentration of SARS-CoV-2 viral copies this assay can detect is 131 copies/mL. A negative result does not preclude SARS-Cov-2 infection and should not be used as the sole basis for treatment or other patient management decisions. A negative result may occur with  improper specimen collection/handling, submission of specimen other than nasopharyngeal swab, presence of viral mutation(s) within the areas targeted by this assay, and inadequate number of viral copies (<131 copies/mL). A negative result must be combined with clinical observations, patient history, and epidemiological information. The expected result is Negative.  Fact Sheet for Patients:  PinkCheek.be  Fact Sheet for Healthcare Providers:  GravelBags.it  This test is no t yet approved or cleared by the [place] FDA and  has been authorized for detection and/or diagnosis of SARS-CoV-2 by FDA under an Emergency Use Authorization (EUA). This EUA will remain  in effect (meaning this test  can be used) for the duration of the COVID-19 declaration under Section 564(b)(1) of the Act, 21 U.S.C. section 360bbb-3(b)(1), unless the authorization is terminated or revoked sooner.     Influenza A by PCR NEGATIVE NEGATIVE Final   Influenza B by PCR NEGATIVE NEGATIVE Final    Comment: (NOTE) The Xpert Xpress  SARS-CoV-2/FLU/RSV assay is intended as an aid in  the diagnosis of influenza from Nasopharyngeal swab specimens and  should not be used as a sole basis for treatment. Nasal washings and  aspirates are unacceptable for Xpert Xpress SARS-CoV-2/FLU/RSV  testing.  Fact Sheet for Patients: PinkCheek.be  Fact Sheet for Healthcare Providers: GravelBags.it  This test is not yet approved or cleared by the [place] FDA and  has been authorized for detection and/or diagnosis of SARS-CoV-2 by  FDA under an Emergency Use Authorization (EUA). This EUA will remain  in effect (meaning this test can be used) for the duration of the  Covid-19 declaration under Section 564(b)(1) of the Act, 21  U.S.C. section 360bbb-3(b)(1), unless the authorization is  terminated or revoked. Performed at [facility], [address], [place], [place] [address]   Urine Culture     Status: Abnormal   Collection Time: 04/10/20  5:55 PM   Specimen: Urine, Random  Result Value Ref Range Status   Specimen Description   Final    URINE, RANDOM Performed at [facility], [address], [place], [place] [address]    Special Requests   Final    NONE Performed at [facility], [address] [place], [place] [address]    Culture MULTIPLE SPECIES PRESENT, SUGGEST RECOLLECTION (A)  Final   Report Status 04/11/2020 FINAL  Final    Coagulation Studies: No results for input(s): LABPROT, INR in the last 72 hours.  Urinalysis:  Recent Labs  Lab 04/10/20 1755  COLORURINE YELLOW*  LABSPEC 1.018  PHURINE 7.0  GLUCOSEU NEGATIVE  HGBUR NEGATIVE  BILIRUBINUR NEGATIVE  KETONESUR NEGATIVE  PROTEINUR NEGATIVE  NITRITE NEGATIVE  LEUKOCYTESUR TRACE*    Lipid Panel:     Component Value Date/Time   CHOL 209 (H) 04/11/2020 0429   TRIG 105 04/11/2020 0429   HDL 52 04/11/2020 0429   CHOLHDL 4.0 04/11/2020 0429   VLDL 21  04/11/2020 0429   LDLCALC 136 (H) 04/11/2020 0429    HgbA1C:  Lab Results  Component Value Date   HGBA1C 6.1 (H) 04/11/2020    Urine Drug Screen:  No results found for: LABOPIA, COCAINSCRNUR, LABBENZ, AMPHETMU, THCU, LABBARB  Alcohol Level: No results for input(s): ETH in the last 168 hours.  Other results: EKG: atrial fibrillation, rate 128 bpm.  Imaging: DG Chest 1 View  Result Date: 04/12/2020 CLINICAL DATA:  Tachycardia EXAM: CHEST  1 VIEW COMPARISON:  04/10/2020 FINDINGS: The heart size and mediastinal contours are within normal limits. Both lungs are clear. The visualized skeletal structures are unremarkable. IMPRESSION: No active disease. Electronically Signed   By: Ulyses Jarred M.D.   On: 04/12/2020 00:39   CT ANGIO CHEST PE W OR WO CONTRAST  Result Date: 04/12/2020 CLINICAL DATA:  Tachycardia and fever with elevated D-dimer EXAM: CT ANGIOGRAPHY CHEST WITH CONTRAST TECHNIQUE: Multidetector CT imaging of the chest was performed using the standard protocol during bolus administration of intravenous contrast. Multiplanar CT image reconstructions and MIPs were obtained to evaluate the vascular anatomy. CONTRAST:  19mL OMNIPAQUE IOHEXOL 350 MG/ML SOLN COMPARISON:  None. FINDINGS: Cardiovascular: Contrast injection is sufficient to demonstrate satisfactory opacification  of the pulmonary arteries to the segmental level. There is no pulmonary embolus or evidence of right heart strain. The size of the main pulmonary artery is normal. Heart size is normal, with no pericardial effusion. The course and caliber of the aorta are normal. There is mild atherosclerotic calcification. Opacification decreased due to pulmonary arterial phase contrast bolus timing. Mediastinum/Nodes: No mediastinal, hilar or axillary lymphadenopathy. Normal visualized thyroid. Thoracic esophageal course is normal. Lungs/Pleura: Emphysema.  No pleural effusion. Upper Abdomen: Contrast bolus timing is not optimized for  evaluation of the abdominal organs. The visualized portions of the organs of the upper abdomen are normal. Musculoskeletal: No chest wall abnormality. No bony spinal canal stenosis. Review of the MIP images confirms the above findings. IMPRESSION: 1. No pulmonary embolus or other acute thoracic abnormality. Aortic Atherosclerosis (ICD10-I70.0) and Emphysema (ICD10-J43.9). Electronically Signed   By: Ulyses Jarred M.D.   On: 04/12/2020 03:56   US Venous Img Lower Bilateral (DVT)  Result Date: 04/12/2020 CLINICAL DATA:  Bilateral leg pain.  Elevated D-dimer. EXAM: BILATERAL LOWER EXTREMITY VENOUS DOPPLER ULTRASOUND TECHNIQUE: Gray-scale sonography with compression, as well as color and duplex ultrasound, were performed to evaluate the deep venous system(s) from the level of the common femoral vein through the popliteal and proximal calf veins. COMPARISON:  None. FINDINGS: BILATERAL VENOUS Normal compressibility of the common femoral, superficial femoral, and popliteal veins, as well as the visualized calf veins. Visualized portions of profunda femoral vein and great saphenous vein unremarkable. No filling defects to suggest DVT on grayscale or color Doppler imaging. Doppler waveforms show normal direction of venous flow, normal respiratory plasticity and response to augmentation. OTHER Complex fluid collection in the right popliteal fossa measuring 3.6 x 1.1 x 2.0 cm, possibly representing a Bakers cyst. Limitations: none IMPRESSION: 1.  Negative for DVT in the bilateral lower extremities. 2. Complex collection in the right popliteal fossa measuring 3.6 cm, possibly a Bakers cyst. Electronically Signed   By: Audie Pinto M.D.   On: 04/12/2020 11:41   ECHOCARDIOGRAM COMPLETE  Result Date: 04/12/2020    ECHOCARDIOGRAM REPORT   Patient Name:   ESLI CLEMENTS [facility] Date of Exam: 04/11/2020 Medical Rec #:  [id number]       Height:       63.0 in Accession #:    [id number]      Weight:       162.2 lb Date of Birth:   1925-08-23       BSA:          1.769 m Patient Age:    30 years        BP:           137/68 mmHg Patient Gender: F               HR:           85 bpm. Exam Location:  [facility] Procedure: 2D Echo, Cardiac Doppler and Color Doppler Indications:     TIA 435.9 / G45.9  History:         Patient has prior history of Echocardiogram examinations. Risk                  Factors:Hypertension.  Sonographer:     Alyse Low Roar Referring Phys:  [id number] Arvella Merles MANSY Diagnosing Phys: Yolonda Kida MD IMPRESSIONS  1. Left ventricular ejection fraction, by estimation, is 55 to 60%. The left ventricle has normal function. The left ventricle has no regional wall motion abnormalities. Left ventricular  diastolic parameters are consistent with Grade I diastolic dysfunction (impaired relaxation).  2. Right ventricular systolic function is normal. The right ventricular size is normal.  3. The mitral valve is abnormal. Trivial mitral valve regurgitation. Mild mitral stenosis.  4. The aortic valve is normal in structure. Aortic valve regurgitation is not visualized. FINDINGS  Left Ventricle: Left ventricular ejection fraction, by estimation, is 55 to 60%. The left ventricle has normal function. The left ventricle has no regional wall motion abnormalities. The left ventricular internal cavity size was normal in size. There is  borderline left ventricular hypertrophy. Left ventricular diastolic parameters are consistent with Grade I diastolic dysfunction (impaired relaxation). Right Ventricle: The right ventricular size is normal. No increase in right ventricular wall thickness. Right ventricular systolic function is normal. Left Atrium: Left atrial size was normal in size. Right Atrium: Right atrial size was normal in size. Pericardium: There is no evidence of pericardial effusion. Mitral Valve: The mitral valve is abnormal. There is mild thickening of the mitral valve leaflet(s). There is mild calcification of the mitral valve leaflet(s). Mild  to moderate mitral annular calcification. Trivial mitral valve regurgitation. Mild mitral valve stenosis. Tricuspid Valve: The tricuspid valve is grossly normal. Tricuspid valve regurgitation is trivial. Aortic Valve: The aortic valve is normal in structure. Aortic valve regurgitation is not visualized. Aortic valve peak gradient measures 12.5 mmHg. Pulmonic Valve: The pulmonic valve was normal in structure. Pulmonic valve regurgitation is not visualized. Aorta: Aortic root could not be assessed. IAS/Shunts: The interatrial septum was not well visualized.  LEFT VENTRICLE PLAX 2D LVIDd:         3.82 cm  Diastology LVIDs:         2.64 cm  LV e' medial:    7.83 cm/s LV PW:         1.22 cm  LV E/e' medial:  15.8 LV IVS:        1.42 cm  LV e' lateral:   6.09 cm/s LVOT diam:     1.70 cm  LV E/e' lateral: 20.4 LVOT Area:     2.27 cm  RIGHT VENTRICLE RV Mid diam:    3.14 cm RV S prime:     12.80 cm/s TAPSE (M-mode): 2.2 cm LEFT ATRIUM             Index       RIGHT ATRIUM           Index LA diam:        3.30 cm 1.87 cm/m  RA Area:     12.20 cm LA Vol (A2C):   58.1 ml 32.84 ml/m RA Volume:   24.20 ml  13.68 ml/m LA Vol (A4C):   36.4 ml 20.58 ml/m LA Biplane Vol: 46.9 ml 26.51 ml/m  AORTIC VALVE                PULMONIC VALVE AV Area (Vmax): 1.69 cm    PV Vmax:        1.00 m/s AV Vmax:        177.00 cm/s PV Peak grad:   4.0 mmHg AV Peak Grad:   12.5 mmHg   RVOT Peak grad: 3 mmHg LVOT Vmax:      132.00 cm/s  AORTA Ao Root diam: 2.80 cm MITRAL VALVE                TRICUSPID VALVE MV Area (PHT): 4.17 cm     TR Peak grad:   38.9 mmHg MV Decel Time:  182 msec     TR Vmax:        312.00 cm/s MV E velocity: 124.00 cm/s MV A velocity: 98.50 cm/s   SHUNTS MV E/A ratio:  1.26         Systemic Diam: 1.70 cm MV A Prime:    10.7 cm/s Dwayne Prince Rome MD Electronically signed by Yolonda Kida MD Signature Date/Time: 04/12/2020/1:20:48 PM    Final      Assessment/Plan: 86 y.o. female with a history of multiple medical  problems including HTN, HLD, DM, paroxysmal SVT and informal diagnosis of dementia.  Patient given informal diagnosis quite a few years ago but family declined formal evaluation at that time.  Has had gradual and progressive deterioration since that time.  Patient lives alone but has required increased amount of support over that period of time.  Has had increased falls with a fractured wrist incurred last year and a SDH.  Has had a more precipitous decline since wrist fracture.  Has been noted to have period of agitation, paranoia and hallucinations as well.  Patient with more acute changes that led to admission but it appears patient has improved since that time with evidence by description that is suggestive of hospital delirium with sundowning.  Acute worsening may have been secondary to ?UTI and/or afib with RVR.  Both appear resolved at this time.  MRI of the brain personally reviewed and shows no acute changes.  B12, TSH, folate only significant for a low normal B12.   Concerned was raised for possible encephalitis.  Patient has not had a progressive course that would suggest that and I feel that LP to rule this out is very low yield.  This was discussed with daughter and it has been decided against this procedure.  Treatment with acyclovir not recommended since patient already with evidence of elevated Cr and low likelihood of a herpes encephalitis.   Due to hospital-related delirium do not expect patient to be at baseline at discharge and will require additional support.  Due to progressive dementia prior to admission, expect this decline to continue.   Anticoagulation for atrial fibrillation declined due to recent history of SDH and frequent falls.  Recommendations: 1. Environmental support for delirium: Lights on during the day, patient up and out of bed as much as is feasible, OT/PT, quiet dimly lit room at night, reorient patient often, provide hearing aides and glasses if patient uses them  routinely, minimize sleep disruptions as much as possible overnight.  As much as possible, reorient patient, and have them engage patient in activities, e.g. playing cards. TV should be off or on neutral background music unless patient engaged and watching. Try to keep interactions with the patient calm and quiet.  2. Agree with therapy 3. LP, acyclovir not indicated at this time.  4. Patient to follow up with PCP after discharge   Alexis Goodell, MD Neurology  04/13/2020, 2:39 PM

## 2020-04-13 NOTE — Progress Notes (Signed)
Physical Therapy Treatment Patient Details Name: Erica Donovan MRN: [id number] DOB: 1926/04/13 Today's Date: 04/13/2020    History of Present Illness Pt is a 86 yo female that presented to the ED for AMS per family, weakness, fever. Did experience nausea and vomiting as well.   PMH includes COPD, depression, dementia, DM, GERD, HLD, HTN as well as recent R wrist fracture and ORIF.    PT Comments    Pt tolerated treatment well today. Pt demonstrates improved mentation and orientation post exercise; at beginning of session pt only oriented to first name and location, but was able to state full name, DOB, location, situation, and month/year at end of session. Pt able to improve overall assistance levels, ambulation distance, and activity tolerance since last session. However, further mobility continues to be limited secondary to overall deconditioning and poor safety awareness. Pt required max multimodal cueing for safety with ambulation and transfers. Pt left supine in bed, with bed in chair position and HOB at 50deg. Pt will continue to benefit from skilled acute PT services to address deficits for return to baseline function. Will continue to recommend SNF at DC.     Follow Up Recommendations  SNF     Equipment Recommendations  None recommended by PT    Recommendations for Other Services       Precautions / Restrictions Precautions Precautions: Fall Restrictions Weight Bearing Restrictions: No    Mobility  Bed Mobility Overal bed mobility: Needs Assistance Bed Mobility: Supine to Sit;Sit to Supine     Supine to sit: Supervision;HOB elevated Sit to supine: Supervision;HOB elevated   General bed mobility comments: Supervision for safety to sit EOB with HOB elevated and use of UE support on handrail. Pt required increased time/effort to perform mobility.  Transfers Overall transfer level: Needs assistance Equipment used: Rolling walker (2 wheeled) Transfers: Sit to/from  Stand;Anterior-Posterior Transfer Sit to Stand: Min assist;From elevated surface     Anterior-Posterior transfers: Min guard   General transfer comment: Min assist for power to stand from elevated bed height and BSC with RW; verbal cues for hand placement. CGA for safety to scoot anteriorly towards EOB with use of BUE for support.  Ambulation/Gait Ambulation/Gait assistance: Min guard Gait Distance (Feet): 4 Feet Assistive device: Rolling walker (2 wheeled)       General Gait Details: Pt required CGA for safety to ambulate short distance with RW from EOB<>BSC. Pt demonstrated forward flexed posture, step to gait pattern, decreased step length/foot clearance bil, poor safety awareness and decreased walker proximity. Verbal/tactile cues provided for walker proximity to ensure safety with mobility.   Stairs             Wheelchair Mobility    Modified Rankin (Stroke Patients Only)       Balance Overall balance assessment: Needs assistance Sitting-balance support: Feet supported;Bilateral upper extremity supported Sitting balance-Leahy Scale: Fair Sitting balance - Comments: Fair seated balance at EOB with BUE support; pt able to perform seated exercises at EOB without LOB. Postural control: Posterior lean Standing balance support: Bilateral upper extremity supported Standing balance-Leahy Scale: Poor Standing balance comment: Poor standing balance with BUE on RW, requiring CGA for safety                            Cognition Arousal/Alertness: Awake/alert Behavior During Therapy: WFL for tasks assessed/performed Overall Cognitive Status: History of cognitive impairments - at baseline  General Comments: Initially pt only oriented to first name and place, and was unable to report last name, situation, and time despite cues. At end of session, was A&O x4.      Exercises Other Exercises Other Exercises: Pt able to  perform x15 BLE exercises seated EOB including: marching, ankle pumps, heel raises, LAQ, and resisted hip ABD/ADD. Pt also performed x8 STS from elevated bed height with RW and CGA. Decreased eccentric control noted with fatigue. Other Exercises: Pt able to ambulate 61ft x2 from EOB<>BSC for toileting. Pt able to stand to perform self hygiene in RW with unilateral UE support and wide BOS.    General Comments General comments (skin integrity, edema, etc.): Pt initially appeared lethargic, disoriented; but with activity/conversation became much more engaged and appropriate      Pertinent Vitals/Pain Pain Assessment: No/denies pain    Home Living                      Prior Function            PT Goals (current goals can now be found in the care plan section) Acute Rehab PT Goals Patient Stated Goal: to go home Progress towards PT goals: Progressing toward goals    Frequency    Min 2X/week      PT Plan Current plan remains appropriate    Co-evaluation              AM-PAC PT "6 Clicks" Mobility   Outcome Measure  Help needed turning from your back to your side while in a flat bed without using bedrails?: A Little Help needed moving from lying on your back to sitting on the side of a flat bed without using bedrails?: A Little Help needed moving to and from a bed to a chair (including a wheelchair)?: A Little Help needed standing up from a chair using your arms (e.g., wheelchair or bedside chair)?: A Little Help needed to walk in hospital room?: A Little Help needed climbing 3-5 steps with a railing? : Total 6 Click Score: 16    End of Session Equipment Utilized During Treatment: Gait belt Activity Tolerance: Patient tolerated treatment well Patient left: in bed;with call bell/phone within reach;with family/visitor present;with bed alarm set (bed in chair position for eating) Nurse Communication: Mobility status PT Visit Diagnosis: Muscle weakness (generalized)  (M62.81);Difficulty in walking, not elsewhere classified (R26.2);Other abnormalities of gait and mobility (R26.89);Unsteadiness on feet (R26.81)     Time: 3976-7341 PT Time Calculation (min) (ACUTE ONLY): 18 min  Charges:  $Therapeutic Exercise: 8-22 mins                     Herminio Commons, PT, DPT 2:37 PM,04/13/20

## 2020-04-13 NOTE — Progress Notes (Signed)
Daily Progress Note   Patient Name: SILVA AAMODT       Date: 04/13/2020 DOB: 04-18-1926  Age: 86 y.o. MRN#: [id number] Attending Physician: Kayleen Memos, DO Primary Care Physician: Juluis Pitch, MD Admit Date: 04/10/2020  Reason for Consultation/Follow-up: Establishing goals of care  Subjective: Patient resting in bed. No acute distress noted.  Daughter is at the bedside.  She shares patient had a "horrible" night with increased confusion, agitation, attempting to get out of bed and remove lines.  She reports patient worked with OT earlier today and tolerated well however she noticed that patient was increasingly weaker than expected.  Mild opportunity for daughter to express feelings with understanding of her mother's current illness and comorbidities.  Education provided on hospital delirium/sundowning/dementia in elderly patients.  Daughter verbalized understanding and appreciation.  Reports patient continues to have a fair appetite.  [name] shares she remains hopeful patient will show some improvement however she knows that her mother's current condition is far different than her baseline.  She reports family is very much open to SNF for rehabilitation if patient is able to tolerate.  Daughter has advanced directive documents and requesting to review.  I reviewed in detail with her patient's written advanced directives including DNR and MOST form that was completed in 2014.  Education provided and recommendations to complete an updated MOST form as some of the indicated wishes are different from what is specified in patient's living will.  Daughter verbalized understanding and requested to complete document.  I completed an updated MOST form today with patient's daughter/POA, [name]. [name] outlined her mother's wishes for the following treatment decisions as indicated on her advanced directives and known wishes:  Cardiopulmonary Resuscitation: Do Not Attempt Resuscitation (DNR/No CPR)   Medical Interventions: Limited Additional Interventions: Use medical treatment, IV fluids and cardiac monitoring as indicated, DO NOT USE intubation or mechanical ventilation. May consider use of less invasive airway support such as BiPAP or CPAP. Also provide comfort measures. Transfer to the hospital if indicated. Avoid intensive care.   Antibiotics: Determine use of limitation of antibiotics when infection occurs  IV Fluids: IV fluids for a defined trial period  Feeding Tube: No feeding tube    Length of Stay: 2 days  Vital Signs: BP (!) 160/73 (BP Location: Right Arm)   Pulse 86   Temp 98.8 F (37.1 C) (Oral)   Resp 18   Ht 5\' 3"  (1.6 m)   Wt 73.6 kg   SpO2 95%   BMI 28.73 kg/m  SpO2: SpO2: 95 % O2 Device: O2 Device: Room Air O2 Flow Rate: O2 Flow Rate (L/min): 2 L/min  Physical Exam: -resting, easily aroused, NAD -normal breathing pattern              Palliative Care Assessment & Plan  HPI: Palliative Care consult requested for goals of care discussion in this 86 y.o. female with multiple medical problems including type diabetes mellitus, hypertension, dyslipidemia, dementia, and COPD. Patient presented to ED from home with complaints of generalized weakness and altered mental status.Family reports patient has been experiencing recurrent falls over the last month which has resulted in a right wrist fracture s/p ORIF and in May 2021 a fall resulting in a subdural hemorrhage. Since admission patient initiated on IV antibiotics for bacteria presence on UA. MRI of brain showed no acute abnormalities. Patient also with new onset atrial fibrillation with RVR.  Code Status:  DNR  Goals of Care/Recommendations:  DNR/DNI/MOST form updated and placed  on chart, with copy of AD. Daughter has DNR document with her at the bedside  Continue with current plan of care per medical team  [name] expressed her hopes of patient showing some improvement with awareness of potentially new  baseline. Open to SNF for rehab if patient can tolerate. If no improvement the goal of care would be to focus on patient's comfort.   PMT will continue to support and follow   Prognosis: GUARDED   Discharge Planning: To Be Determined (SNF with rehab and outpatient Palliative)   Thank you for allowing the Palliative Medicine Team to assist in the care of this patient.  Time Total: 45 min.   Visit consisted of counseling and education dealing with the complex and emotionally intense issues of symptom management and palliative care in the setting of serious and potentially life-threatening illness.Greater than 50%  of this time was spent counseling and coordinating care related to the above assessment and plan.  Alda Lea, AGPCNP-BC  Palliative Medicine Team [phone]

## 2020-04-13 NOTE — Progress Notes (Signed)
PROGRESS NOTE  Erica Donovan JQZ:[id number] DOB: 1926/04/15 DOA: 04/10/2020 PCP: Juluis Pitch, MD  HPI/Recap of past 57 hours: 86 year old lady with prior history of hypertension hyperlipidemia, COPD and diabetes presents with altered mental status, confusion and generalized weakness along with fever.  Abs were significant for AKI and urine was abnormal showing trace leukocytes and rare bacteria.  She was admitted for further evaluation of acute metabolic encephalopathy.    04/13/20: Seen and examined at bedside this morning.  Pleasantly confused.  Per her daughter she has reported neck pain and headache for which she was taking Tylenol.  History of varicella, no hx of zoster, had her immunization.  Neurology consulted due to possible encephalitis.   Assessment/Plan: Active Problems:   AMS (altered mental status)   Acute encephalopathy  Acute metabolic encephalopathy, persistent, in the setting of mild dementia. Per her daughter she is not back to her baseline mentation. Work-up has been unrevealing MRI negative for acute CVA, urine culture multiple species present.  Per her daughter she has reported neck pain and headache for which she was taking Tylenol.  History of varicella, no hx of zoster, had her immunization.  Neurology consulted due to possible encephalitis. Continue to monitor Delirium and fall precautions Obtain labs and ammonia level.  Physical debility/ambulatory dysfunction PT assessment recommended SNF Daughter prefers to bring home rather than sending to SNF. Continue PT OT with assistance and fall precautions.  Chronic anxiety Continue home Lexapro  Dementia with behavioral disturbance Sundowning Start Seroquel 12.5 mg twice daily  Chronic diastolic CHF Last 2D echo done on 04/11/2020 showed LVEF 55 to 60% with grade 1 diastolic dysfunction Euvolemic on exam Continue home regimen  Essential hypertension Continue home regimen Continue to monitor  vital signs  Moderate protein calorie malnutrition BMI 28, albumin 3.1 Continue oral supplement Encourage oral intake    Code Status: DNR  Family Communication: Daughter updated at bedside  Disposition Plan: Discharge to home with home health services PT OT RN home health aide.   Consultants:  Neurology  Procedures:  None  Antimicrobials:  None  DVT prophylaxis: Subcu Lovenox daily  Status is: Inpatient    Dispo:  Patient From: Home  Planned Disposition: Home with Health Care Svc  Expected discharge date: 04/14/20  Medically stable for discharge: No, ongoing management of acute metabolic encephalopathy.         Objective: Vitals:   04/12/20 2027 04/12/20 2356 04/13/20 0509 04/13/20 0752  BP: (!) 127/57 (!) 149/61 (!) 161/93 (!) 160/73  Pulse: 80 85 80 86  Resp: 16 (!) 24 16 18   Temp: 98.5 F (36.9 C) 98.5 F (36.9 C) 98.2 F (36.8 C) 98.8 F (37.1 C)  TempSrc: Oral Oral Oral Oral  SpO2: 96% 94% 98% 95%  Weight:      Height:        Intake/Output Summary (Last 24 hours) at 04/13/2020 1231 Last data filed at 04/13/2020 1013 Gross per 24 hour  Intake 1513.33 ml  Output 0 ml  Net 1513.33 ml   Filed Weights   04/10/20 1720  Weight: 73.6 kg    Exam:  . General: 86 y.o. year-old female well developed well nourished in no acute distress.  Alert and oriented x3. . Cardiovascular: Regular rate and rhythm with no rubs or gallops.  No thyromegaly or JVD noted.   Marland Kitchen Respiratory: Clear to auscultation with no wheezes or rales. Good inspiratory effort. . Abdomen: Soft nontender nondistended with normal bowel sounds x4 quadrants. . Musculoskeletal:  No lower extremity edema. 2/4 pulses in all 4 extremities. . Skin: No ulcerative lesions noted or rashes, . Psychiatry: Mood is appropriate for condition and setting   Data Reviewed: CBC: Recent Labs  Lab 04/10/20 1755 04/11/20 0429 04/12/20 1144 04/13/20 1136  WBC 7.3 4.9 5.6 5.1  NEUTROABS  5.9  --   --  2.7  HGB 12.4 9.9* 11.1* 11.2*  HCT 37.7 30.3* 33.0* 33.3*  MCV 99.0 99.7 98.5 98.2  PLT 227 179 189 741   Basic Metabolic Panel: Recent Labs  Lab 04/10/20 1755 04/11/20 0429 04/12/20 0056 04/13/20 1136  NA 136 137 136 137  K 4.3 4.0 4.0 4.2  CL 98 106 103 103  CO2 26 23 24 26   GLUCOSE 114* 149* 109* 134*  BUN 24* 22 19 20   CREATININE 1.25* 1.27* 1.18* 0.97  CALCIUM 10.0 8.8* 9.1 9.7  MG  --   --  1.7  --    GFR: Estimated Creatinine Clearance: 35.6 mL/min (by C-G formula based on SCr of 0.97 mg/dL). Liver Function Tests: Recent Labs  Lab 04/10/20 1755 04/13/20 1136  AST 21 29  ALT 14 26  ALKPHOS 58 62  BILITOT 0.7 0.5  PROT 7.0 5.8*  ALBUMIN 4.0 3.1*   No results for input(s): LIPASE, AMYLASE in the last 168 hours. Recent Labs  Lab 04/13/20 1136  AMMONIA 28   Coagulation Profile: No results for input(s): INR, PROTIME in the last 168 hours. Cardiac Enzymes: No results for input(s): CKTOTAL, CKMB, CKMBINDEX, TROPONINI in the last 168 hours. BNP (last 3 results) No results for input(s): PROBNP in the last 8760 hours. HbA1C: Recent Labs    04/11/20 0429  HGBA1C 6.1*   CBG: Recent Labs  Lab 04/12/20 1311 04/12/20 1629 04/12/20 2109 04/13/20 0753 04/13/20 1137  GLUCAP 112* 100* 127* 94 126*   Lipid Profile: Recent Labs    04/11/20 0429  CHOL 209*  HDL 52  LDLCALC 136*  TRIG 105  CHOLHDL 4.0   Thyroid Function Tests: Recent Labs    04/12/20 0056  TSH 1.989   Anemia Panel: Recent Labs    04/12/20 0056 04/12/20 1144  VITAMINB12  --  383  FOLATE 35.0  --   FERRITIN 76  --   TIBC 223*  --   IRON 21*  --   RETICCTPCT  --  1.8   Urine analysis:    Component Value Date/Time   COLORURINE YELLOW (A) 04/10/2020 1755   APPEARANCEUR HAZY (A) 04/10/2020 1755   APPEARANCEUR Clear 03/02/2013 1333   LABSPEC 1.018 04/10/2020 1755   LABSPEC 1.009 03/02/2013 1333   PHURINE 7.0 04/10/2020 [place] 04/10/2020  1755   GLUCOSEU Negative 03/02/2013 1333   [place] 04/10/2020 [place] 04/10/2020 1755   BILIRUBINUR Negative 03/02/2013 1333   KETONESUR NEGATIVE 04/10/2020 1755   PROTEINUR NEGATIVE 04/10/2020 1755   NITRITE NEGATIVE 04/10/2020 1755   LEUKOCYTESUR TRACE (A) 04/10/2020 1755   LEUKOCYTESUR Negative 03/02/2013 1333   Sepsis Labs: @LABRCNTIP (procalcitonin:4,lacticidven:4)  ) Recent Results (from the past 240 hour(s))  Blood culture (routine x 2)     Status: None (Preliminary result)   Collection Time: 04/10/20  5:55 PM   Specimen: BLOOD  Result Value Ref Range Status   Specimen Description BLOOD RIGHT AC  Final   Special Requests   Final    BOTTLES DRAWN AEROBIC AND ANAEROBIC Blood Culture adequate volume   Culture   Final    NO GROWTH  3 DAYS Performed at [facility], [address] [place], [place] [address]    Report Status PENDING  Incomplete  Blood culture (routine x 2)     Status: None (Preliminary result)   Collection Time: 04/10/20  5:55 PM   Specimen: BLOOD  Result Value Ref Range Status   Specimen Description BLOOD LEFT [facility]  Final   Special Requests   Final    BOTTLES DRAWN AEROBIC AND ANAEROBIC Blood Culture adequate volume   Culture   Final    NO GROWTH 3 DAYS Performed at [facility], [address], [place], [place] [address]    Report Status PENDING  Incomplete  Respiratory Panel by RT PCR (Flu A&B, Covid) - Nasopharyngeal Swab     Status: None   Collection Time: 04/10/20  5:55 PM   Specimen: Nasopharyngeal Swab  Result Value Ref Range Status   SARS Coronavirus 2 by RT PCR NEGATIVE NEGATIVE Final    Comment: (NOTE) SARS-CoV-2 target nucleic acids are NOT DETECTED.  The SARS-CoV-2 RNA is generally detectable in upper respiratoy specimens during the acute phase of infection. The lowest concentration of SARS-CoV-2 viral copies this assay can detect is 131 copies/mL. A negative result does not preclude  SARS-Cov-2 infection and should not be used as the sole basis for treatment or other patient management decisions. A negative result may occur with  improper specimen collection/handling, submission of specimen other than nasopharyngeal swab, presence of viral mutation(s) within the areas targeted by this assay, and inadequate number of viral copies (<131 copies/mL). A negative result must be combined with clinical observations, patient history, and epidemiological information. The expected result is Negative.  Fact Sheet for Patients:  PinkCheek.be  Fact Sheet for Healthcare Providers:  GravelBags.it  This test is no t yet approved or cleared by the [place] FDA and  has been authorized for detection and/or diagnosis of SARS-CoV-2 by FDA under an Emergency Use Authorization (EUA). This EUA will remain  in effect (meaning this test can be used) for the duration of the COVID-19 declaration under Section 564(b)(1) of the Act, 21 U.S.C. section 360bbb-3(b)(1), unless the authorization is terminated or revoked sooner.     Influenza A by PCR NEGATIVE NEGATIVE Final   Influenza B by PCR NEGATIVE NEGATIVE Final    Comment: (NOTE) The Xpert Xpress SARS-CoV-2/FLU/RSV assay is intended as an aid in  the diagnosis of influenza from Nasopharyngeal swab specimens and  should not be used as a sole basis for treatment. Nasal washings and  aspirates are unacceptable for Xpert Xpress SARS-CoV-2/FLU/RSV  testing.  Fact Sheet for Patients: PinkCheek.be  Fact Sheet for Healthcare Providers: GravelBags.it  This test is not yet approved or cleared by the [place] FDA and  has been authorized for detection and/or diagnosis of SARS-CoV-2 by  FDA under an Emergency Use Authorization (EUA). This EUA will remain  in effect (meaning this test can be used) for the duration of the    Covid-19 declaration under Section 564(b)(1) of the Act, 21  U.S.C. section 360bbb-3(b)(1), unless the authorization is  terminated or revoked. Performed at [facility], [address] [place], [place] [address]   Urine Culture     Status: Abnormal   Collection Time: 04/10/20  5:55 PM   Specimen: Urine, Random  Result Value Ref Range Status   Specimen Description   Final    URINE, RANDOM Performed at [facility], [address] [place], [place] [address]  Special Requests   Final    NONE Performed at [facility], [address] [place], [place] [address]    Culture MULTIPLE SPECIES PRESENT, SUGGEST RECOLLECTION (A)  Final   Report Status 04/11/2020 FINAL  Final      Studies: No results found.  Scheduled Meds: . amiodarone  200 mg Oral BID  . vitamin C  500 mg Oral Daily  . aspirin EC  81 mg Oral Daily  . atorvastatin  20 mg Oral Daily  . docusate sodium  200 mg Oral Daily  . enoxaparin (LOVENOX) injection  30 mg Subcutaneous Q24H  . escitalopram  20 mg Oral BH-q7a  . feeding supplement (ENSURE ENLIVE)  237 mL Oral BID BM  . ferrous sulfate  325 mg Oral BID WC  . insulin aspart  0-9 Units Subcutaneous TID PC & HS  . loratadine  10 mg Oral Daily  . metoprolol tartrate  25 mg Oral BID  . montelukast  10 mg Oral QHS  . multivitamin with minerals  1 tablet Oral Daily  . omega-3 acid ethyl esters  1 g Oral Daily  . pantoprazole  40 mg Oral Daily    Continuous Infusions: . sodium chloride Stopped (04/12/20 0237)     LOS: 2 days     Kayleen Memos, MD [facility] Pager [phone]  If 7PM-7AM, please contact night-coverage [email or web address] Password [facility] 04/13/2020, 12:31 PM

## 2020-04-14 LAB — GLUCOSE, CAPILLARY
Glucose-Capillary: 83 mg/dL (ref 70–99)
Glucose-Capillary: 155 mg/dL — ABNORMAL HIGH (ref 70–99)
Glucose-Capillary: 99 mg/dL (ref 70–99)
Glucose-Capillary: 169 mg/dL — ABNORMAL HIGH (ref 70–99)

## 2020-04-14 LAB — SARS CORONAVIRUS 2 BY RT PCR (HOSPITAL ORDER, PERFORMED IN ~~LOC~~ HOSPITAL LAB): SARS Coronavirus 2: NEGATIVE

## 2020-04-14 NOTE — TOC Progression Note (Signed)
Transition of Care [facility]) - Progression Note    Patient Details  Name: Erica Donovan MRN: [id number] Date of Birth: 09/12/1925  Transition of Care [facility]) CM/SW [place], RN Phone Number: 04/14/2020, 12:42 PM  Clinical Narrative:   RNCM met with patient and daughter at bedside to present bed offers of [place]. Daughter returned short time later and reported that they wanted to go with [facility]. RNCM reached out to [place] with [place] as well as accepting the bed in the hub.     Expected Discharge Plan: [place] Barriers to Discharge: Continued Medical Work up  Expected Discharge Plan and Services Expected Discharge Plan: [place] arrangements for the past 2 months: Single Family Home Expected Discharge Date: 04/14/20                                     Social Determinants of Health (SDOH) Interventions    Readmission Risk Interventions No flowsheet data found.

## 2020-04-14 NOTE — Care Management Important Message (Signed)
Important Message  Patient Details  Name: Erica Donovan MRN: [id number] Date of Birth: 1926/06/23   Medicare Important Message Given:  Yes     Dannette Barbara 04/14/2020, 1:13 PM

## 2020-04-14 NOTE — Progress Notes (Signed)
[facility] Cardiology  Patient Description:Erica Donovan is a 86 year old female with PMH significant for paroxysmal SVT, HTN, HLD, DM Type II, COPD and CKD stage II who presented to [facility] ED  with altered mental status. The patient was found to be febrile and hypertensive. ACS workup was unremarkable with high sensitivity troponin's 8>> and CXR was unremarkable. CT of the chest was negative for PE. MRI was negative for acute CVA. The patient was found to be in A-Fib with RVR and was initially treated with IV metoprolol without success; therefore, Cardiology was consulted for further evaluation.  SUBJECTIVE: The patient reports to be doing much better on today but c/o feeling "tired" and "sleepy." She denies all chest pain, dyspnea, palpitations, dizziness or syncope at this time. The patient's daughter reports that the patient had a significantly better night, she rested well, was more oriented and was not attempting to get out of bed.   (04/13/20) -The patient reports that she is doing well on today and without any complaints at this time. The patient's daughter reports that the patient had a "rough" night. She reports that the pt became increasingly confused, having hallucinations and attempting to get out of bed. She also reports that the patient experienced some wheezing on last night and has had a poor appetite since her admission. The daughter expresses concerns over the patient's mental status because this is such a drastic change from her baseline cognitive status.   OBJECTIVE: The patient's mental status appears to be improved on today as she is now AOx2. She appears fairly well, appears euvolemic and she is resting comfortably in bed without any distress. Her bedside telemetry monitor reveals NSR with a HR of 63 and she is normotensive.   (04/13/20) -The patient appears fairly well on today, is resting comfortably in bed and is in no apparent distress. The patient is Alert and oriented to herself  and she in aware that she is in the hospital, but does not recall the name of the hospital or the city. The telemetry monitor reveals atrial fibrillation with a HR of 114 bpm and the patient is slightly hypertensive at this time.   Vitals:   04/13/20 0752 04/13/20 1624 04/13/20 2339 04/14/20 0806  BP: (!) 160/73 113/62 (!) 143/63 (!) 153/73  Pulse: 86 71 64 66  Resp: 18 18 20 15   Temp: 98.8 F (37.1 C) 98.2 F (36.8 C) 98.7 F (37.1 C) 98.2 F (36.8 C)  TempSrc: Oral Oral Oral Oral  SpO2: 95% 97% 96% 96%  Weight:      Height:         Intake/Output Summary (Last 24 hours) at 04/14/2020 0853 Last data filed at 04/14/2020 2831 Gross per 24 hour  Intake 720 ml  Output 0 ml  Net 720 ml      PHYSICAL EXAM General: Well developed, well nourished, in no acute distress HEENT:  Normocephalic and atramatic; No JVD; supple; carotid +2 Lungs: Clear bilaterally to auscultation; chest expansion symmetrical Heart: regular heart rate and rhythm . Normal S1 and S2 without gallops or murmurs.  Abdomen: Bowel sounds are positive, abdomen soft and non-tender  Msk:  Normal strength and tone for age. Extremities: No clubbing, cyanosis or edema.   Neuro: Alert and oriented X 2 Psych:  Good affect, responds appropriately   LABS: Basic Metabolic Panel: Recent Labs    04/12/20 0056 04/13/20 1136  NA 136 137  K 4.0 4.2  CL 103 103  CO2 24 26  GLUCOSE  109* 134*  BUN 19 20  CREATININE 1.18* 0.97  CALCIUM 9.1 9.7  MG 1.7  --    Liver Function Tests: Recent Labs    04/13/20 1136  AST 29  ALT 26  ALKPHOS 62  BILITOT 0.5  PROT 5.8*  ALBUMIN 3.1*   No results for input(s): LIPASE, AMYLASE in the last 72 hours. CBC: Recent Labs    04/12/20 1144 04/13/20 1136  WBC 5.6 5.1  NEUTROABS  --  2.7  HGB 11.1* 11.2*  HCT 33.0* 33.3*  MCV 98.5 98.2  PLT 189 188   Cardiac Enzymes: No results for input(s): CKTOTAL, CKMB, CKMBINDEX, TROPONINI in the last 72 hours. BNP: Invalid  input(s): POCBNP D-Dimer: No results for input(s): DDIMER in the last 72 hours. Hemoglobin A1C: No results for input(s): HGBA1C in the last 72 hours. Fasting Lipid Panel: No results for input(s): CHOL, HDL, LDLCALC, TRIG, CHOLHDL, LDLDIRECT in the last 72 hours. Thyroid Function Tests: Recent Labs    04/12/20 0056  TSH 1.989   Anemia Panel: Recent Labs    04/12/20 0056 04/12/20 1144  VITAMINB12  --  383  FOLATE 35.0  --   FERRITIN 76  --   TIBC 223*  --   IRON 21*  --   RETICCTPCT  --  1.8    US Venous Img Lower Bilateral (DVT)  Result Date: 04/12/2020 CLINICAL DATA:  Bilateral leg pain.  Elevated D-dimer. EXAM: BILATERAL LOWER EXTREMITY VENOUS DOPPLER ULTRASOUND TECHNIQUE: Gray-scale sonography with compression, as well as color and duplex ultrasound, were performed to evaluate the deep venous system(s) from the level of the common femoral vein through the popliteal and proximal calf veins. COMPARISON:  None. FINDINGS: BILATERAL VENOUS Normal compressibility of the common femoral, superficial femoral, and popliteal veins, as well as the visualized calf veins. Visualized portions of profunda femoral vein and great saphenous vein unremarkable. No filling defects to suggest DVT on grayscale or color Doppler imaging. Doppler waveforms show normal direction of venous flow, normal respiratory plasticity and response to augmentation. OTHER Complex fluid collection in the right popliteal fossa measuring 3.6 x 1.1 x 2.0 cm, possibly representing a Bakers cyst. Limitations: none IMPRESSION: 1.  Negative for DVT in the bilateral lower extremities. 2. Complex collection in the right popliteal fossa measuring 3.6 cm, possibly a Bakers cyst. Electronically Signed   By: Audie Pinto M.D.   On: 04/12/2020 11:41       TELEMETRY: NSR with HR in the 60's  ASSESSMENT AND PLAN:  Active Problems:   AMS (altered mental status)   Acute encephalopathy  Atrial Fibrillation with  RVR HTN HLD Chronic partial thickness wounds DM type II Anemia   1.Atrial Fibrillation with RVR: fairly stable, patient is asymptomatic             -continue Amiodarone load orally 200mg  BID.  continue metoprolol tartrate 25mg  BID.  -Monitor vss q4h.              -Agree with continuous Telemetry monitoring.              -Echocardiogram reveals normal LV systolic function with estimated EF of 55-60% with Grade I diastolic dysfunction and mild MS.              -CHA2DS@-VASc score of 5; however, due to h/o recent subdural hematoma, anticoagulation will not be started at this time.   -Recommend trending CMP and CBC  2. HTN: stable at this time, patient is normotensive              -  Continue Metoprolol tartrate.              -monitor VS q4h.             -low sodium, heart healthy diet.               3. HLD: stable             -continue atorvastatin.   4. Acute metabolic encephalopathy in the setting of mild dementia  -Neurology consult and recs appreciated.   - CVA and UTI ruled out             -blood Cultures revealed no growth in 3 days.              -Urine culture reveals multiple species.                           5. Chronic partial thickness wound:                          -Recommendations per WOC appreciated.   6. DM Type II: stable             -Agree with sliding scale insulin therapy and CBG monitoring.   7. Anemia: stable             -Continue to trend CBC.     [place], ACNPC-AG  04/14/2020 8:53 AM

## 2020-04-14 NOTE — Progress Notes (Signed)
Occupational Therapy Treatment Patient Details Name: Erica Donovan MRN: [id number] DOB: Mar 30, 1926 Today's Date: 04/14/2020    History of present illness Pt is a 86 yo female that presented to the ED for AMS per family, weakness, fever. Did experience nausea and vomiting as well.   PMH includes COPD, depression, dementia, DM, GERD, HLD, HTN as well as recent R wrist fracture and ORIF.   OT comments  Erica Donovan participated in OT treatment this date. She was received in bed, alert, talkative, oriented to person and place, and stating she has no pain. Pt appears much more alert and engaged than in previous days. Her daughter is present in room. Erica Donovan states she needs to use the restroom and moves quickly and easily from supine to EOB sitting, then comes into standing with RW and CGA for safety, and is able to pivot-transfer to [facility] and manipulate hospital gown without any physical assistance from therapist. Performs post-toileting hygiene w/ SUPV, then pivots to sink and able to wash and dry hands with no UE support but trunk/LE leaning against cabinet. Pt then ambulated ~ 6 ft with RW and CGA before returning to bed. Pt again able to complete bed mobility tasks easily and without physical assistance. Discussed discharge/future care need plans with daughter, who stated that she and her family would like to have her mom at home soon, hope to limit time in SNF to only a few days, with a family member present there regularly. Pt will continue to benefit from skilled OT services while hospitalized. A short-term SNF placement to allow pt to regain strength and endurance and reduce fall risk is appropriate. Home with health and personal care services could also be appropriate, if family is able to obtain needed support services, including [place].    Follow Up Recommendations  SNF;Other (comment) (D/C home with supports in place?)    Equipment Recommendations       Recommendations for Other  Services      Precautions / Restrictions Precautions Precautions: Fall Restrictions Weight Bearing Restrictions: No       Mobility Bed Mobility Overal bed mobility: Needs Assistance Bed Mobility: Supine to Sit;Sit to Supine     Supine to sit: Supervision;HOB elevated Sit to supine: Supervision;HOB elevated   General bed mobility comments: Pt moved supine<>sit quickly and fluidly this PM  Transfers Overall transfer level: Needs assistance Equipment used: Rolling walker (2 wheeled) Transfers: Sit to/from [facility] Sit to Stand: Min guard Stand pivot transfers: Min guard       General transfer comment: Pt came to standing from EOB w/o physical assist    Balance Overall balance assessment: Needs assistance Sitting-balance support: Feet supported;Bilateral upper extremity supported Sitting balance-Leahy Scale: Good     Standing balance support: Bilateral upper extremity supported Standing balance-Leahy Scale: Fair                             ADL either performed or assessed with clinical judgement   ADL Overall ADL's : Needs assistance/impaired     Grooming: Wash/dry hands;Min guard                   Toilet Transfer: Magazine features editor Details (indicate cue type and reason): RW, SPT Toileting- Clothing Manipulation and Hygiene: Min guard               Vision Baseline Vision/History: Wears glasses Wears Glasses: At all times Patient Visual  Report: No change from baseline     Perception     Praxis      Cognition Arousal/Alertness: Awake/alert Behavior During Therapy: WFL for tasks assessed/performed Overall Cognitive Status: History of cognitive impairments - at baseline                                 General Comments: Pt oriented to person and place, still unclear on date. But conversationally appropriate today and displaying good sense of humor.        Exercises Other Exercises Other  Exercises: Pt performs SPT from EOB to [facility] with RW and CGA. Performs post-toileting hygenie with SUPV and unilateraly UE support. Ambulates ~ 6 ft with RW, CGA   Shoulder Instructions       General Comments      Pertinent Vitals/ Pain       Pain Assessment: No/denies pain  Home Living                                          Prior Functioning/Environment              Frequency  Min 1X/week        Progress Toward Goals  OT Goals(current goals can now be found in the care plan section)  Progress towards OT goals: Progressing toward goals  Acute Rehab OT Goals Patient Stated Goal: to go home OT Goal Formulation: With patient/family Time For Goal Achievement: 04/25/20 Potential to Achieve Goals: Good  Plan Discharge plan remains appropriate;Other (comment) (Discharge home with supports/home health could also be appropriate)    Co-evaluation                 AM-PAC OT "6 Clicks" Daily Activity     Outcome Measure   Help from another person eating meals?: A Little Help from another person taking care of personal grooming?: A Little Help from another person toileting, which includes using toliet, bedpan, or urinal?: A Little Help from another person bathing (including washing, rinsing, drying)?: A Lot Help from another person to put on and taking off regular upper body clothing?: A Little Help from another person to put on and taking off regular lower body clothing?: A Little 6 Click Score: 17    End of Session Equipment Utilized During Treatment: Rolling walker  OT Visit Diagnosis: Unsteadiness on feet (R26.81);Repeated falls (R29.6);Muscle weakness (generalized) (M62.81);Other symptoms and signs involving cognitive function   Activity Tolerance Patient tolerated treatment well   Patient Left in bed;with family/visitor present;with call bell/phone within reach;with bed alarm set   Nurse Communication          Time: 3300-7622 OT  Time Calculation (min): 27 min  Charges: OT General Charges $OT Visit: 1 Visit OT Treatments $Self Care/Home Management : 23-37 mins  Josiah Lobo, PhD, [place], OTR/L ascom [phone] 04/14/20, 3:56 PM

## 2020-04-14 NOTE — TOC Progression Note (Signed)
Transition of Care [facility]) - Progression Note    Patient Details  Name: Erica Donovan MRN: [id number] Date of Birth: 1925/09/17  Transition of Care [facility]) CM/SW [place], [place] Phone Number: 04/14/2020, 1:36 PM  Clinical Narrative:     30 day note, H&P, FL2 and clinical progress notes uploaded to [facility] Must website for PASSR application.  Expected Discharge Plan: [place] Barriers to Discharge: Continued Medical Work up  Expected Discharge Plan and Services Expected Discharge Plan: [place] arrangements for the past 2 months: Single Family Home Expected Discharge Date: 04/14/20                                     Social Determinants of Health (SDOH) Interventions    Readmission Risk Interventions No flowsheet data found.

## 2020-04-14 NOTE — Progress Notes (Signed)
PROGRESS NOTE  Erica Donovan KGM:[id number] DOB: June 20, 1926 DOA: 04/10/2020 PCP: Juluis Pitch, MD  HPI/Recap of past 59 hours: 86 year old lady with prior history of hypertension hyperlipidemia, COPD and type 2 diabetes presents with altered mental status, confusion and generalized weakness along with fever.  Labs were significant for AKI and urine was abnormal showing trace leukocytes and rare bacteria.  She was admitted for further evaluation of acute metabolic encephalopathy.  Seen by neurology, thought secondary to delirium in the setting of dementia.  Started on Seroquel 12.5 mg BID.  04/14/20:  Somnolent this AM.  Evaluated by PT with recs for SNF.    Assessment/Plan: Active Problems:   AMS (altered mental status)   Acute encephalopathy  Acute metabolic encephalopathy, persistent, in the setting of dementia. Work-up has been unrevealing MRI negative for acute CVA, urine culture multiple species present, ammonia level normal.  Per her daughter she has reported neck pain and headache for which she was taking Tylenol.  History of varicella, no hx of zoster, had her immunization.  Seen by neurology, no need for further work up.  Likely related to delirum.  Continue delirium precautions. Started Seroquel on 04/13/20, continue  Physical debility/ambulatory dysfunction PT assessment recommended SNF Daughter will decide on SNF vs Home with HHS. Continue PT OT with assistance and fall precautions.  Chronic anxiety Continue home Lexapro  Dementia with behavioral disturbance Sundowning Seroquel 12.5 mg twice daily  Chronic diastolic CHF Last 2D echo done on 04/11/2020 showed LVEF 55 to 60% with grade 1 diastolic dysfunction Euvolemic on exam Continue home regimen  Essential hypertension Continue home regimen Continue to monitor vital signs  Moderate protein calorie malnutrition BMI 28, albumin 3.1 Continue oral supplement Continue to Encourage oral intake    Code  Status: DNR  Family Communication: Daughter updated at bedside  Disposition Plan: Discharge to SNF versus to home with home health services PT OT RN home health aide CSW.   Consultants:  Neurology  Procedures:  None  Antimicrobials:  None  DVT prophylaxis: Subcu Lovenox daily  Status is: Inpatient    Dispo:  Patient From: Home  Planned Disposition: Liberal  Expected discharge date: 04/15/20  Medically stable for discharge: Yes, ongoing management of acute metabolic encephalopathy.         Objective: Vitals:   04/13/20 0752 04/13/20 1624 04/13/20 2339 04/14/20 0806  BP: (!) 160/73 113/62 (!) 143/63 (!) 153/73  Pulse: 86 71 64 66  Resp: 18 18 20 15   Temp: 98.8 F (37.1 C) 98.2 F (36.8 C) 98.7 F (37.1 C) 98.2 F (36.8 C)  TempSrc: Oral Oral Oral Oral  SpO2: 95% 97% 96% 96%  Weight:      Height:        Intake/Output Summary (Last 24 hours) at 04/14/2020 1401 Last data filed at 04/14/2020 1347 Gross per 24 hour  Intake 540 ml  Output 0 ml  Net 540 ml   Filed Weights   04/10/20 1720  Weight: 73.6 kg    Exam:  . General: 86 y.o. year-old female Well developed in no acute distress. Somnolent. . Cardiovascular: RRR no rubs or gallops. Marland Kitchen Respiratory: CTA no wheezes or rales . Abdomen: Soft NT ND NBS . Musculoskeletal: No LE edema bilaterally . Psychiatry: Mood is appropriate   Data Reviewed: CBC: Recent Labs  Lab 04/10/20 1755 04/11/20 0429 04/12/20 1144 04/13/20 1136  WBC 7.3 4.9 5.6 5.1  NEUTROABS 5.9  --   --  2.7  HGB 12.4  9.9* 11.1* 11.2*  HCT 37.7 30.3* 33.0* 33.3*  MCV 99.0 99.7 98.5 98.2  PLT 227 179 189 213   Basic Metabolic Panel: Recent Labs  Lab 04/10/20 1755 04/11/20 0429 04/12/20 0056 04/13/20 1136  NA 136 137 136 137  K 4.3 4.0 4.0 4.2  CL 98 106 103 103  CO2 26 23 24 26   GLUCOSE 114* 149* 109* 134*  BUN 24* 22 19 20   CREATININE 1.25* 1.27* 1.18* 0.97  CALCIUM 10.0 8.8* 9.1 9.7  MG  --    --  1.7  --    GFR: Estimated Creatinine Clearance: 35.6 mL/min (by C-G formula based on SCr of 0.97 mg/dL). Liver Function Tests: Recent Labs  Lab 04/10/20 1755 04/13/20 1136  AST 21 29  ALT 14 26  ALKPHOS 58 62  BILITOT 0.7 0.5  PROT 7.0 5.8*  ALBUMIN 4.0 3.1*   No results for input(s): LIPASE, AMYLASE in the last 168 hours. Recent Labs  Lab 04/13/20 1136  AMMONIA 28   Coagulation Profile: No results for input(s): INR, PROTIME in the last 168 hours. Cardiac Enzymes: No results for input(s): CKTOTAL, CKMB, CKMBINDEX, TROPONINI in the last 168 hours. BNP (last 3 results) No results for input(s): PROBNP in the last 8760 hours. HbA1C: No results for input(s): HGBA1C in the last 72 hours. CBG: Recent Labs  Lab 04/13/20 1137 04/13/20 1625 04/13/20 2116 04/14/20 0805 04/14/20 1150  GLUCAP 126* 145* 166* 83 155*   Lipid Profile: No results for input(s): CHOL, HDL, LDLCALC, TRIG, CHOLHDL, LDLDIRECT in the last 72 hours. Thyroid Function Tests: Recent Labs    04/12/20 0056  TSH 1.989   Anemia Panel: Recent Labs    04/12/20 0056 04/12/20 1144  VITAMINB12  --  383  FOLATE 35.0  --   FERRITIN 76  --   TIBC 223*  --   IRON 21*  --   RETICCTPCT  --  1.8   Urine analysis:    Component Value Date/Time   COLORURINE YELLOW (A) 04/10/2020 1755   APPEARANCEUR HAZY (A) 04/10/2020 1755   APPEARANCEUR Clear 03/02/2013 1333   LABSPEC 1.018 04/10/2020 1755   LABSPEC 1.009 03/02/2013 1333   PHURINE 7.0 04/10/2020 [place] 04/10/2020 1755   GLUCOSEU Negative 03/02/2013 1333   HGBUR NEGATIVE 04/10/2020 [place] 04/10/2020 1755   BILIRUBINUR Negative 03/02/2013 1333   KETONESUR NEGATIVE 04/10/2020 1755   PROTEINUR NEGATIVE 04/10/2020 1755   NITRITE NEGATIVE 04/10/2020 1755   LEUKOCYTESUR TRACE (A) 04/10/2020 1755   LEUKOCYTESUR Negative 03/02/2013 1333   Sepsis Labs: @LABRCNTIP (procalcitonin:4,lacticidven:4)  ) Recent Results  (from the past 240 hour(s))  Blood culture (routine x 2)     Status: None (Preliminary result)   Collection Time: 04/10/20  5:55 PM   Specimen: BLOOD  Result Value Ref Range Status   Specimen Description BLOOD RIGHT [facility]  Final   Special Requests   Final    BOTTLES DRAWN AEROBIC AND ANAEROBIC Blood Culture adequate volume   Culture   Final    NO GROWTH 4 DAYS Performed at [facility], [address] [place], [place] [address]    Report Status PENDING  Incomplete  Blood culture (routine x 2)     Status: None (Preliminary result)   Collection Time: 04/10/20  5:55 PM   Specimen: BLOOD  Result Value Ref Range Status   Specimen Description BLOOD LEFT [facility]  Final   Special Requests   Final    BOTTLES  DRAWN AEROBIC AND ANAEROBIC Blood Culture adequate volume   Culture   Final    NO GROWTH 4 DAYS Performed at [facility], [address] [place], [place] [address]    Report Status PENDING  Incomplete  Respiratory Panel by RT PCR (Flu A&B, Covid) - Nasopharyngeal Swab     Status: None   Collection Time: 04/10/20  5:55 PM   Specimen: Nasopharyngeal Swab  Result Value Ref Range Status   SARS Coronavirus 2 by RT PCR NEGATIVE NEGATIVE Final    Comment: (NOTE) SARS-CoV-2 target nucleic acids are NOT DETECTED.  The SARS-CoV-2 RNA is generally detectable in upper respiratoy specimens during the acute phase of infection. The lowest concentration of SARS-CoV-2 viral copies this assay can detect is 131 copies/mL. A negative result does not preclude SARS-Cov-2 infection and should not be used as the sole basis for treatment or other patient management decisions. A negative result may occur with  improper specimen collection/handling, submission of specimen other than nasopharyngeal swab, presence of viral mutation(s) within the areas targeted by this assay, and inadequate number of viral copies (<131 copies/mL). A negative result must be combined with clinical observations,  patient history, and epidemiological information. The expected result is Negative.  Fact Sheet for Patients:  PinkCheek.be  Fact Sheet for Healthcare Providers:  GravelBags.it  This test is no t yet approved or cleared by the [place] FDA and  has been authorized for detection and/or diagnosis of SARS-CoV-2 by FDA under an Emergency Use Authorization (EUA). This EUA will remain  in effect (meaning this test can be used) for the duration of the COVID-19 declaration under Section 564(b)(1) of the Act, 21 U.S.C. section 360bbb-3(b)(1), unless the authorization is terminated or revoked sooner.     Influenza A by PCR NEGATIVE NEGATIVE Final   Influenza B by PCR NEGATIVE NEGATIVE Final    Comment: (NOTE) The Xpert Xpress SARS-CoV-2/FLU/RSV assay is intended as an aid in  the diagnosis of influenza from Nasopharyngeal swab specimens and  should not be used as a sole basis for treatment. Nasal washings and  aspirates are unacceptable for Xpert Xpress SARS-CoV-2/FLU/RSV  testing.  Fact Sheet for Patients: PinkCheek.be  Fact Sheet for Healthcare Providers: GravelBags.it  This test is not yet approved or cleared by the [place] FDA and  has been authorized for detection and/or diagnosis of SARS-CoV-2 by  FDA under an Emergency Use Authorization (EUA). This EUA will remain  in effect (meaning this test can be used) for the duration of the  Covid-19 declaration under Section 564(b)(1) of the Act, 21  U.S.C. section 360bbb-3(b)(1), unless the authorization is  terminated or revoked. Performed at [facility], [address] [place], [place] [address]   Urine Culture     Status: Abnormal   Collection Time: 04/10/20  5:55 PM   Specimen: Urine, Random  Result Value Ref Range Status   Specimen Description   Final    URINE, RANDOM Performed at [facility], [address], [place], [place] [address]    Special Requests   Final    NONE Performed at [facility], [address] [place], [place] [address]    Culture MULTIPLE SPECIES PRESENT, SUGGEST RECOLLECTION (A)  Final   Report Status 04/11/2020 FINAL  Final      Studies: No results found.  Scheduled Meds: . amiodarone  200 mg Oral BID  . vitamin C  500 mg Oral Daily  . aspirin EC  81  mg Oral Daily  . atorvastatin  20 mg Oral Daily  . docusate sodium  200 mg Oral Daily  . enoxaparin (LOVENOX) injection  40 mg Subcutaneous Q24H  . escitalopram  20 mg Oral [id number]  . feeding supplement  237 mL Oral BID BM  . ferrous sulfate  325 mg Oral BID WC  . insulin aspart  0-9 Units Subcutaneous TID PC & HS  . loratadine  10 mg Oral Daily  . metoprolol tartrate  25 mg Oral BID  . montelukast  10 mg Oral QHS  . multivitamin with minerals  1 tablet Oral Daily  . omega-3 acid ethyl esters  1 g Oral Daily  . pantoprazole  40 mg Oral Daily  . QUEtiapine  12.5 mg Oral QHS    Continuous Infusions:    LOS: 3 days     Kayleen Memos, MD [facility] Pager [phone]  If 7PM-7AM, please contact night-coverage [email or web address] Password [email or web address] 04/14/2020, 2:01 PM

## 2020-04-15 LAB — GLUCOSE, CAPILLARY
Glucose-Capillary: 98 mg/dL (ref 70–99)
Glucose-Capillary: 165 mg/dL — ABNORMAL HIGH (ref 70–99)
Glucose-Capillary: 110 mg/dL — ABNORMAL HIGH (ref 70–99)
Glucose-Capillary: 143 mg/dL — ABNORMAL HIGH (ref 70–99)

## 2020-04-15 LAB — CULTURE, BLOOD (ROUTINE X 2)
Culture: NO GROWTH
Culture: NO GROWTH
Special Requests: ADEQUATE
Special Requests: ADEQUATE

## 2020-04-15 LAB — CREATININE, SERUM
Creatinine, Ser: 1.17 mg/dL — ABNORMAL HIGH (ref 0.44–1.00)
GFR, Estimated: 40 mL/min — ABNORMAL LOW (ref >60)

## 2020-04-15 MED ORDER — FERROUS SULFATE 325 (65 FE) MG PO TABS
325.0000 mg | ORAL_TABLET | Freq: Two times a day (BID) | ORAL | 0 refills | Status: DC
Start: 2020-04-15 — End: 2021-12-06

## 2020-04-15 MED ORDER — ASPIRIN 81 MG PO TBEC: 81.0000 mg | DELAYED_RELEASE_TABLET | Freq: Every day | ORAL | 0 refills | Status: AC

## 2020-04-15 MED ORDER — MONTELUKAST SODIUM 10 MG PO TABS
10.0000 mg | ORAL_TABLET | Freq: Every day | ORAL | 0 refills | Status: DC
Start: 2020-04-15 — End: 2022-04-28

## 2020-04-15 MED ORDER — INFLUENZA VAC A&B SA ADJ QUAD 0.5 ML IM PRSY
0.5000 mL | PREFILLED_SYRINGE | Freq: Once | INTRAMUSCULAR | Status: AC
  Administered 2020-04-15: 0.5 mL via INTRAMUSCULAR
  Filled 2020-04-15: qty 0.5

## 2020-04-15 MED ORDER — AMIODARONE HCL 200 MG PO TABS
200.0000 mg | ORAL_TABLET | Freq: Two times a day (BID) | ORAL | 0 refills | Status: DC
Start: 2020-04-15 — End: 2021-12-06

## 2020-04-15 MED ORDER — METOPROLOL TARTRATE 25 MG PO TABS
12.5000 mg | ORAL_TABLET | Freq: Two times a day (BID) | ORAL | 0 refills | Status: DC
Start: 2020-04-15 — End: 2021-12-06

## 2020-04-15 MED ORDER — ENOXAPARIN SODIUM 30 MG/0.3ML ~~LOC~~ SOLN
30.0000 mg | SUBCUTANEOUS | Status: DC
  Administered 2020-04-15 – 2020-04-16 (×2): 30 mg via SUBCUTANEOUS
  Filled 2020-04-15 (×2): qty 0.3

## 2020-04-15 MED ORDER — QUETIAPINE FUMARATE 25 MG PO TABS
12.5000 mg | ORAL_TABLET | Freq: Every day | ORAL | 0 refills | Status: DC
Start: 2020-04-15 — End: 2021-12-06

## 2020-04-15 MED ORDER — ATORVASTATIN CALCIUM 20 MG PO TABS
20.0000 mg | ORAL_TABLET | Freq: Every day | ORAL | 0 refills | Status: DC
Start: 2020-04-15 — End: 2020-04-17

## 2020-04-15 MED ORDER — INFLUENZA VAC A&B SA ADJ QUAD 0.5 ML IM PRSY
0.5000 mL | PREFILLED_SYRINGE | INTRAMUSCULAR | Status: DC
  Filled 2020-04-15: qty 0.5

## 2020-04-15 MED ORDER — METOPROLOL TARTRATE 25 MG PO TABS
12.5000 mg | ORAL_TABLET | Freq: Two times a day (BID) | ORAL | Status: DC
  Administered 2020-04-15 – 2020-04-18 (×7): 12.5 mg via ORAL
  Filled 2020-04-15 (×7): qty 1

## 2020-04-15 NOTE — Discharge Summary (Addendum)
Discharge Summary  Erica Donovan:[id number] DOB: Mar 21, 1926  PCP: Juluis Pitch, MD  Admit date: 04/10/2020 Discharge date: 04/15/2020  Time spent: 35 minutes   Recommendations for Outpatient Follow-up:  1. Follow-up with cardiology in 1 to 2 weeks. 2. Follow-up with your primary care provider in 1 to 2 weeks. 3. Continue PT OT with assistance and fall precautions 4. Take your medications as prescribed  Discharge Diagnoses:  Active Hospital Problems   Diagnosis Date Noted  . Acute encephalopathy 04/11/2020  . AMS (altered mental status) 04/10/2020    Resolved Hospital Problems  No resolved problems to display.    Discharge Condition: Stable  Diet recommendation: Resume previous diet.  Vitals:   04/14/20 2349 04/15/20 0722  BP: (!) 116/55 140/66  Pulse: 65 64  Resp: 20 17  Temp: 98.4 F (36.9 C) 98.4 F (36.9 C)  SpO2: 96% 96%    History of present illness:  86 year old lady with prior history of hypertension hyperlipidemia, COPD and type 2 diabetes presents with altered mental status, confusion and generalized weakness along with fever Tmax 100.4. Labs were significant for AKI and urine was abnormal showing trace leukocytes and rare bacteria. She was admitted for further evaluation of acute metabolic encephalopathy. Seen by neurology, thought her altered mental status was secondary to delirium in the setting of dementia.  Started was added on Seroquel 12.5 mg BID with improvement of her mood.  04/15/20:   Seen at her bedside with her daughter present.  Alert and interactive.  She has a nice smile on her face.  She has no new complaints.Marland Kitchen   Hospital Course:  Active Problems:   AMS (altered mental status)   Acute encephalopathy  Resolved acute metabolic encephalopathy likely secondary to delirium in the setting of dementia. Work-up has been unrevealing MRI negative for acute CVA, urine culture multiple species present, ammonia level normal.  Per her  daughter she has reported neck pain and headache for which she was taking Tylenol.  History of varicella, no hx of zoster, had her immunization.  Seen by neurology, no need for further work up.  Likely related to delirum.  Continue delirium precautions. Started Seroquel on 04/13/20 with improvement of her mood.  Paroxysmal A. fib with resolved RVR Seen by cardiology Continue amiodarone 200 mg twice daily until cardiology follow-up outpatient. Rate controlled on p.o. Lopressor 12.5 mg twice daily Follow-up with cardiology  Hyperlipidemia LDL 136 on 04/11/2020 Started on Lipitor 20 mg daily per cardiology Follow-up with cardiology or your PCP  Physical debility/ambulatory dysfunction PT assessment recommended SNF Continue PT OT with assistance and fall precautions.  Chronic anxiety Continue home Lexapro  Dementia with resolved behavioral disturbance while on Seroquel Continue Seroquel 12.5 mg twice daily  Chronic diastolic CHF Last 2D echo done on 04/11/2020 showed LVEF 55 to 60% with grade 1 diastolic dysfunction with mild mitral stenosis. Euvolemic on exam Follow-up with cardiology  Essential hypertension BP is at goal. Continue amiodarone and p.o. Lopressor.  Moderate protein calorie malnutrition BMI 28, albumin 3.1 Continue oral supplement Continue to Encourage oral intake    Code Status: DNR  Family Communication: Daughter updated at bedside   Consultants:  Neurology  Cardiology  Palliative care medicine  Procedures:  None  Antimicrobials:  None   Discharge Exam: BP 140/66 (BP Location: Right Arm)   Pulse 64   Temp 98.4 F (36.9 C) (Oral)   Resp 17   Ht 5\' 3"  (1.6 m)   Wt 73.6 kg   SpO2  96%   BMI 28.73 kg/m  . General: 86 y.o. year-old female well developed well nourished in no acute distress.  Alert and pleasantly confused. . Cardiovascular: Regular rate and rhythm with no rubs or gallops.  No thyromegaly or JVD noted.    Marland Kitchen Respiratory: Clear to auscultation with no wheezes or rales. Good inspiratory effort. . Abdomen: Soft nontender nondistended with normal bowel sounds x4 quadrants. . Musculoskeletal: No lower extremity edema. 2/4 pulses in all 4 extremities. Marland Kitchen Psychiatry: Mood is appropriate for condition and setting  Discharge Instructions You were cared for by a hospitalist during your hospital stay. If you have any questions about your discharge medications or the care you received while you were in the hospital after you are discharged, you can call the unit and asked to speak with the hospitalist on call if the hospitalist that took care of you is not available. Once you are discharged, your primary care physician will handle any further medical issues. Please note that NO REFILLS for any discharge medications will be authorized once you are discharged, as it is imperative that you return to your primary care physician (or establish a relationship with a primary care physician if you do not have one) for your aftercare needs so that they can reassess your need for medications and monitor your lab values.   Allergies as of 04/15/2020      Reactions   Penicillins Nausea Only   Did it involve swelling of the face/tongue/throat, SOB, or low BP? Unknown Did it involve sudden or severe rash/hives, skin peeling, or any reaction on the inside of your mouth or nose? Unknown Did you need to seek medical attention at a hospital or doctor's office? Unknown When did it last happen?Many years ago. If all above answers are "NO", may proceed with cephalosporin use.   Ciprofloxacin Nausea Only   Nitrofurantoin Itching, Rash      Medication List    STOP taking these medications   bismuth subsalicylate 347 QQ/59DG suspension Commonly known as: PEPTO BISMOL   carboxymethylcellulose 0.5 % Soln Commonly known as: REFRESH PLUS   cetirizine 10 MG tablet Commonly known as: ZYRTEC   HYDROcodone-acetaminophen 5-325  MG tablet Commonly known as: Norco   LORazepam 0.5 MG tablet Commonly known as: ATIVAN   meloxicam 7.5 MG tablet Commonly known as: MOBIC   sulfamethoxazole-trimethoprim 800-160 MG tablet Commonly known as: BACTRIM DS   vitamin C 500 MG tablet Commonly known as: ASCORBIC ACID     TAKE these medications   acetaminophen 500 MG tablet Commonly known as: TYLENOL Take 500-1,000 mg by mouth every 6 (six) hours as needed for mild pain.   amiodarone 200 MG tablet Commonly known as: PACERONE Take 1 tablet (200 mg total) by mouth 2 (two) times daily.   aspirin 81 MG EC tablet Take 1 tablet (81 mg total) by mouth daily. Swallow whole. Start taking on: April 16, 2020 What changed: additional instructions   atorvastatin 20 MG tablet Commonly known as: LIPITOR Take 1 tablet (20 mg total) by mouth daily.   diphenhydrAMINE 25 MG tablet Commonly known as: BENADRYL Take 25 mg by mouth every 8 (eight) hours as needed for itching.   docusate sodium 100 MG capsule Commonly known as: COLACE Take 200 mg by mouth daily. And prn   escitalopram 20 MG tablet Commonly known as: LEXAPRO Take 20 mg by mouth every morning.   EX-LAX PO Take by mouth as needed.   feeding supplement Liqd Take 237 mLs  by mouth 2 (two) times daily between meals.   ferrous sulfate 325 (65 FE) MG tablet Take 1 tablet (325 mg total) by mouth 2 (two) times daily with a meal.   Fish Oil 1000 MG Caps Take 1,000 mg by mouth 3 (three) times daily.   loratadine 10 MG tablet Commonly known as: CLARITIN Take 10 mg by mouth every morning.   metoprolol tartrate 25 MG tablet Commonly known as: LOPRESSOR Take 0.5 tablets (12.5 mg total) by mouth 2 (two) times daily.   montelukast 10 MG tablet Commonly known as: SINGULAIR Take 1 tablet (10 mg total) by mouth at bedtime.   multivitamin with minerals Tabs tablet Take 1 tablet by mouth daily.   omeprazole 40 MG capsule Commonly known as: PRILOSEC Take 40 mg by  mouth 2 (two) times a day.   QUEtiapine 25 MG tablet Commonly known as: SEROQUEL Take 0.5 tablets (12.5 mg total) by mouth at bedtime.      Allergies  Allergen Reactions  . Penicillins Nausea Only    Did it involve swelling of the face/tongue/throat, SOB, or low BP? Unknown Did it involve sudden or severe rash/hives, skin peeling, or any reaction on the inside of your mouth or nose? Unknown Did you need to seek medical attention at a hospital or doctor's office? Unknown When did it last happen?Many years ago. If all above answers are "NO", may proceed with cephalosporin use.   . Ciprofloxacin Nausea Only  . Nitrofurantoin Itching and Rash    Contact information for follow-up providers    Gladstone Pih, NP. Call in 1 day(s).   Why: Please call for a post hospital follow-up appointment. Contact information: [place] [address] [phone]        Juluis Pitch, MD. Call in 1 day(s).   Specialty: Family Medicine Why: Please call for a post hospital follow-up appointment. Contact information: [address] [place] [address] [phone]            Contact information for after-discharge care    Destination    [facility] .   Service: Skilled Nursing Contact information: [address] [place] [phone]                   The results of significant diagnostics from this hospitalization (including imaging, microbiology, ancillary and laboratory) are listed below for reference.    Significant Diagnostic Studies: DG Chest 1 View  Result Date: 04/12/2020 CLINICAL DATA:  Tachycardia EXAM: CHEST  1 VIEW COMPARISON:  04/10/2020 FINDINGS: The heart size and mediastinal contours are within normal limits. Both lungs are clear. The visualized skeletal structures are unremarkable. IMPRESSION: No active disease. Electronically Signed   By: Ulyses Jarred M.D.   On: 04/12/2020  00:39   CT Head Wo Contrast  Result Date: 04/10/2020 CLINICAL DATA:  86 year old female with altered mental status. EXAM: CT HEAD WITHOUT CONTRAST TECHNIQUE: Contiguous axial images were obtained from the base of the skull through the vertex without intravenous contrast. COMPARISON:  Head CT dated 11/27/2019. FINDINGS: Brain: Moderate age-related atrophy and chronic microvascular ischemic changes. There is no acute intracranial hemorrhage. No mass effect or midline shift. No extra-axial fluid collection. Vascular: No hyperdense vessel or unexpected calcification. Skull: Normal. Negative for fracture or focal lesion. Sinuses/Orbits: No acute finding. Other: None IMPRESSION: 1. No acute intracranial pathology. 2. Moderate age-related atrophy and chronic microvascular ischemic changes. Electronically Signed   By: Laren Everts.D.  On: 04/10/2020 21:44   CT ANGIO CHEST PE W OR WO CONTRAST  Result Date: 04/12/2020 CLINICAL DATA:  Tachycardia and fever with elevated D-dimer EXAM: CT ANGIOGRAPHY CHEST WITH CONTRAST TECHNIQUE: Multidetector CT imaging of the chest was performed using the standard protocol during bolus administration of intravenous contrast. Multiplanar CT image reconstructions and MIPs were obtained to evaluate the vascular anatomy. CONTRAST:  81mL OMNIPAQUE IOHEXOL 350 MG/ML SOLN COMPARISON:  None. FINDINGS: Cardiovascular: Contrast injection is sufficient to demonstrate satisfactory opacification of the pulmonary arteries to the segmental level. There is no pulmonary embolus or evidence of right heart strain. The size of the main pulmonary artery is normal. Heart size is normal, with no pericardial effusion. The course and caliber of the aorta are normal. There is mild atherosclerotic calcification. Opacification decreased due to pulmonary arterial phase contrast bolus timing. Mediastinum/Nodes: No mediastinal, hilar or axillary lymphadenopathy. Normal visualized thyroid. Thoracic  esophageal course is normal. Lungs/Pleura: Emphysema.  No pleural effusion. Upper Abdomen: Contrast bolus timing is not optimized for evaluation of the abdominal organs. The visualized portions of the organs of the upper abdomen are normal. Musculoskeletal: No chest wall abnormality. No bony spinal canal stenosis. Review of the MIP images confirms the above findings. IMPRESSION: 1. No pulmonary embolus or other acute thoracic abnormality. Aortic Atherosclerosis (ICD10-I70.0) and Emphysema (ICD10-J43.9). Electronically Signed   By: Ulyses Jarred M.D.   On: 04/12/2020 03:56   MR BRAIN WO CONTRAST  Result Date: 04/11/2020 CLINICAL DATA:  Initial evaluation for acute altered mental status, confusion. EXAM: MRI HEAD WITHOUT CONTRAST TECHNIQUE: Multiplanar, multiecho pulse sequences of the brain and surrounding structures were obtained without intravenous contrast. COMPARISON:  Prior head CT from 04/10/2020. FINDINGS: Brain: Examination mildly degraded by motion artifact. Generalized age-related cerebral atrophy. Patchy and confluent T2/FLAIR hyperintensity within the periventricular deep white matter both cerebral hemispheres most consistent with chronic small vessel ischemic disease, moderate in nature. No abnormal foci of restricted diffusion to suggest acute or subacute ischemia. Gray-white matter differentiation maintained. No encephalomalacia to suggest chronic cortical infarction. No foci of susceptibility artifact to suggest acute or chronic intracranial hemorrhage. No mass lesion, midline shift or mass effect. No hydrocephalus or extra-axial fluid collection. Pituitary gland suprasellar region normal. Midline structures intact. Vascular: Major intracranial vascular flow voids are maintained. Skull and upper cervical spine: Craniocervical junction within normal limits. Bone marrow signal intensity normal. No scalp soft tissue abnormality. Sinuses/Orbits: Patient status post bilateral ocular lens replacement.  Globes and orbital soft tissues demonstrate no acute finding. Mild scattered mucosal thickening noted within the ethmoidal air cells and right maxillary sinus. Paranasal sinuses are otherwise clear. No mastoid effusion. Inner ear structures grossly normal. Other: None. IMPRESSION: 1. No acute intracranial abnormality. 2. Age-related cerebral atrophy with moderate chronic microvascular ischemic disease. Electronically Signed   By: Jeannine Boga M.D.   On: 04/11/2020 01:44   US Carotid Bilateral (at [facility] and [facility] only)  Result Date: 04/11/2020 CLINICAL DATA:  86 year old female with confusion. EXAM: BILATERAL CAROTID DUPLEX ULTRASOUND TECHNIQUE: Pearline Cables scale imaging, color Doppler and duplex ultrasound were performed of bilateral carotid and vertebral arteries in the neck. COMPARISON:  03/03/2019 FINDINGS: Criteria: Quantification of carotid stenosis is based on velocity parameters that correlate the residual internal carotid diameter with NASCET-based stenosis levels, using the diameter of the distal internal carotid lumen as the denominator for stenosis measurement. The following velocity measurements were obtained: RIGHT ICA: Peak systolic velocity 76 cm/sec, End diastolic velocity 17 cm/sec CCA: Peak systolic velocity 63 cm/sec  SYSTOLIC ICA/CCA RATIO:  1.2 ECA: Peak systolic velocity 606 cm/sec LEFT ICA: Peak systolic velocity 89 cm/sec, End diastolic velocity 30 cm/sec CCA: 62 cm/sec SYSTOLIC ICA/CCA RATIO:  1.4 ECA: 92 cm/sec RIGHT CAROTID ARTERY: Minimal atherosclerotic plaque formation at the carotid bifurcation. No significant tortuosity. Normal low resistance waveforms. RIGHT VERTEBRAL ARTERY:  Antegrade flow. LEFT CAROTID ARTERY: Minimal atherosclerotic plaque formation at the carotid bifurcation. No significant tortuosity. Normal low resistance waveforms. LEFT VERTEBRAL ARTERY:  Antegrade flow. Upper extremity non-invasive blood pressures: Not obtained IMPRESSION: 1. Right carotid artery system:  Less than 50% stenosis secondary to minimal atherosclerotic plaque formation. 2. Left carotid artery system: Less than 50% stenosis secondary to minimal atherosclerotic plaque formation. 3.  Vertebral artery system: Patent with antegrade flow bilaterally. Ruthann Cancer, MD [facility] Electronically Signed   By: Ruthann Cancer MD   On: 04/11/2020 07:30   US Venous Img Lower Bilateral (DVT)  Result Date: 04/12/2020 CLINICAL DATA:  Bilateral leg pain.  Elevated D-dimer. EXAM: BILATERAL LOWER EXTREMITY VENOUS DOPPLER ULTRASOUND TECHNIQUE: Gray-scale sonography with compression, as well as color and duplex ultrasound, were performed to evaluate the deep venous system(s) from the level of the common femoral vein through the popliteal and proximal calf veins. COMPARISON:  None. FINDINGS: BILATERAL VENOUS Normal compressibility of the common femoral, superficial femoral, and popliteal veins, as well as the visualized calf veins. Visualized portions of profunda femoral vein and great saphenous vein unremarkable. No filling defects to suggest DVT on grayscale or color Doppler imaging. Doppler waveforms show normal direction of venous flow, normal respiratory plasticity and response to augmentation. OTHER Complex fluid collection in the right popliteal fossa measuring 3.6 x 1.1 x 2.0 cm, possibly representing a Bakers cyst. Limitations: none IMPRESSION: 1.  Negative for DVT in the bilateral lower extremities. 2. Complex collection in the right popliteal fossa measuring 3.6 cm, possibly a Bakers cyst. Electronically Signed   By: Audie Pinto M.D.   On: 04/12/2020 11:41   DG Chest Portable 1 View  Result Date: 04/10/2020 CLINICAL DATA:  86 year old female with fever. EXAM: PORTABLE CHEST 1 VIEW COMPARISON:  Chest radiograph dated 03/02/2019. FINDINGS: No focal consolidation, pleural effusion, or pneumothorax. Stable cardiac silhouette. Coronary vascular  calcification and atherosclerotic calcification of the aortic arch. No acute osseous pathology. IMPRESSION: No active disease. Electronically Signed   By: Anner Crete M.D.   On: 04/10/2020 18:23   ECHOCARDIOGRAM COMPLETE  Result Date: 04/12/2020    ECHOCARDIOGRAM REPORT   Patient Name:   AZYRIA OSMON [facility] Date of Exam: 04/11/2020 Medical Rec #:  [id number]       Height:       63.0 in Accession #:    [id number]      Weight:       162.2 lb Date of Birth:  Oct 09, 1925       BSA:          1.769 m Patient Age:    72 years        BP:           137/68 mmHg Patient Gender: F               HR:           85 bpm. Exam Location:  [facility] Procedure: 2D Echo, Cardiac Doppler and Color Doppler Indications:     TIA 435.9 / G45.9  History:         Patient has prior history of Echocardiogram examinations.  Risk                  Factors:Hypertension.  Sonographer:     Alyse Low Roar Referring Phys:  [id number] Arvella Merles MANSY Diagnosing Phys: Yolonda Kida MD IMPRESSIONS  1. Left ventricular ejection fraction, by estimation, is 55 to 60%. The left ventricle has normal function. The left ventricle has no regional wall motion abnormalities. Left ventricular diastolic parameters are consistent with Grade I diastolic dysfunction (impaired relaxation).  2. Right ventricular systolic function is normal. The right ventricular size is normal.  3. The mitral valve is abnormal. Trivial mitral valve regurgitation. Mild mitral stenosis.  4. The aortic valve is normal in structure. Aortic valve regurgitation is not visualized. FINDINGS  Left Ventricle: Left ventricular ejection fraction, by estimation, is 55 to 60%. The left ventricle has normal function. The left ventricle has no regional wall motion abnormalities. The left ventricular internal cavity size was normal in size. There is  borderline left ventricular hypertrophy. Left ventricular diastolic parameters are consistent with Grade I diastolic dysfunction (impaired relaxation). Right  Ventricle: The right ventricular size is normal. No increase in right ventricular wall thickness. Right ventricular systolic function is normal. Left Atrium: Left atrial size was normal in size. Right Atrium: Right atrial size was normal in size. Pericardium: There is no evidence of pericardial effusion. Mitral Valve: The mitral valve is abnormal. There is mild thickening of the mitral valve leaflet(s). There is mild calcification of the mitral valve leaflet(s). Mild to moderate mitral annular calcification. Trivial mitral valve regurgitation. Mild mitral valve stenosis. Tricuspid Valve: The tricuspid valve is grossly normal. Tricuspid valve regurgitation is trivial. Aortic Valve: The aortic valve is normal in structure. Aortic valve regurgitation is not visualized. Aortic valve peak gradient measures 12.5 mmHg. Pulmonic Valve: The pulmonic valve was normal in structure. Pulmonic valve regurgitation is not visualized. Aorta: Aortic root could not be assessed. IAS/Shunts: The interatrial septum was not well visualized.  LEFT VENTRICLE PLAX 2D LVIDd:         3.82 cm  Diastology LVIDs:         2.64 cm  LV e' medial:    7.83 cm/s LV PW:         1.22 cm  LV E/e' medial:  15.8 LV IVS:        1.42 cm  LV e' lateral:   6.09 cm/s LVOT diam:     1.70 cm  LV E/e' lateral: 20.4 LVOT Area:     2.27 cm  RIGHT VENTRICLE RV Mid diam:    3.14 cm RV S prime:     12.80 cm/s TAPSE (M-mode): 2.2 cm LEFT ATRIUM             Index       RIGHT ATRIUM           Index LA diam:        3.30 cm 1.87 cm/m  RA Area:     12.20 cm LA Vol (A2C):   58.1 ml 32.84 ml/m RA Volume:   24.20 ml  13.68 ml/m LA Vol (A4C):   36.4 ml 20.58 ml/m LA Biplane Vol: 46.9 ml 26.51 ml/m  AORTIC VALVE                PULMONIC VALVE AV Area (Vmax): 1.69 cm    PV Vmax:        1.00 m/s AV Vmax:        177.00 cm/s PV Peak grad:  4.0 mmHg AV Peak Grad:   12.5 mmHg   RVOT Peak grad: 3 mmHg LVOT Vmax:      132.00 cm/s  AORTA Ao Root diam: 2.80 cm MITRAL VALVE                 TRICUSPID VALVE MV Area (PHT): 4.17 cm     TR Peak grad:   38.9 mmHg MV Decel Time: 182 msec     TR Vmax:        312.00 cm/s MV E velocity: 124.00 cm/s MV A velocity: 98.50 cm/s   SHUNTS MV E/A ratio:  1.26         Systemic Diam: 1.70 cm MV A Prime:    10.7 cm/s Yolonda Kida MD Electronically signed by Yolonda Kida MD Signature Date/Time: 04/12/2020/1:20:48 PM    Final     Microbiology: Recent Results (from the past 240 hour(s))  Blood culture (routine x 2)     Status: None   Collection Time: 04/10/20  5:55 PM   Specimen: BLOOD  Result Value Ref Range Status   Specimen Description BLOOD RIGHT [facility]  Final   Special Requests   Final    BOTTLES DRAWN AEROBIC AND ANAEROBIC Blood Culture adequate volume   Culture   Final    NO GROWTH 5 DAYS Performed at [facility], [address] [place], [place] [address]    Report Status 04/15/2020 FINAL  Final  Blood culture (routine x 2)     Status: None   Collection Time: 04/10/20  5:55 PM   Specimen: BLOOD  Result Value Ref Range Status   Specimen Description BLOOD LEFT [facility]  Final   Special Requests   Final    BOTTLES DRAWN AEROBIC AND ANAEROBIC Blood Culture adequate volume   Culture   Final    NO GROWTH 5 DAYS Performed at [facility], [address] [place], [place] [address]    Report Status 04/15/2020 FINAL  Final  Respiratory Panel by RT PCR (Flu A&B, Covid) - Nasopharyngeal Swab     Status: None   Collection Time: 04/10/20  5:55 PM   Specimen: Nasopharyngeal Swab  Result Value Ref Range Status   SARS Coronavirus 2 by RT PCR NEGATIVE NEGATIVE Final    Comment: (NOTE) SARS-CoV-2 target nucleic acids are NOT DETECTED.  The SARS-CoV-2 RNA is generally detectable in upper respiratoy specimens during the acute phase of infection. The lowest concentration of SARS-CoV-2 viral copies this assay can detect is 131 copies/mL. A negative result does not preclude SARS-Cov-2 infection and should not be used as  the sole basis for treatment or other patient management decisions. A negative result may occur with  improper specimen collection/handling, submission of specimen other than nasopharyngeal swab, presence of viral mutation(s) within the areas targeted by this assay, and inadequate number of viral copies (<131 copies/mL). A negative result must be combined with clinical observations, patient history, and epidemiological information. The expected result is Negative.  Fact Sheet for Patients:  PinkCheek.be  Fact Sheet for Healthcare Providers:  GravelBags.it  This test is no t yet approved or cleared by the [place] FDA and  has been authorized for detection and/or diagnosis of SARS-CoV-2 by FDA under an Emergency Use Authorization (EUA). This EUA will remain  in effect (meaning this test can be used) for the duration of the COVID-19 declaration under Section 564(b)(1) of the Act, 21 U.S.C. section 360bbb-3(b)(1), unless the authorization is terminated or revoked sooner.  Influenza A by PCR NEGATIVE NEGATIVE Final   Influenza B by PCR NEGATIVE NEGATIVE Final    Comment: (NOTE) The Xpert Xpress SARS-CoV-2/FLU/RSV assay is intended as an aid in  the diagnosis of influenza from Nasopharyngeal swab specimens and  should not be used as a sole basis for treatment. Nasal washings and  aspirates are unacceptable for Xpert Xpress SARS-CoV-2/FLU/RSV  testing.  Fact Sheet for Patients: PinkCheek.be  Fact Sheet for Healthcare Providers: GravelBags.it  This test is not yet approved or cleared by the [place] FDA and  has been authorized for detection and/or diagnosis of SARS-CoV-2 by  FDA under an Emergency Use Authorization (EUA). This EUA will remain  in effect (meaning this test can be used) for the duration of the  Covid-19 declaration under Section 564(b)(1)  of the Act, 21  U.S.C. section 360bbb-3(b)(1), unless the authorization is  terminated or revoked. Performed at [facility], [address] [place], [place] [address]   Urine Culture     Status: Abnormal   Collection Time: 04/10/20  5:55 PM   Specimen: Urine, Random  Result Value Ref Range Status   Specimen Description   Final    URINE, RANDOM Performed at [facility], [address], [place], [place] [address]    Special Requests   Final    NONE Performed at [facility], [address] [place], [place] [address]    Culture MULTIPLE SPECIES PRESENT, SUGGEST RECOLLECTION (A)  Final   Report Status 04/11/2020 FINAL  Final  SARS Coronavirus 2 by RT PCR (hospital order, performed in [facility] hospital lab) Nasopharyngeal Nasopharyngeal Swab     Status: None   Collection Time: 04/14/20  3:51 PM   Specimen: Nasopharyngeal Swab  Result Value Ref Range Status   SARS Coronavirus 2 NEGATIVE NEGATIVE Final    Comment: (NOTE) SARS-CoV-2 target nucleic acids are NOT DETECTED.  The SARS-CoV-2 RNA is generally detectable in upper and lower respiratory specimens during the acute phase of infection. The lowest concentration of SARS-CoV-2 viral copies this assay can detect is 250 copies / mL. A negative result does not preclude SARS-CoV-2 infection and should not be used as the sole basis for treatment or other patient management decisions.  A negative result may occur with improper specimen collection / handling, submission of specimen other than nasopharyngeal swab, presence of viral mutation(s) within the areas targeted by this assay, and inadequate number of viral copies (<250 copies / mL). A negative result must be combined with clinical observations, patient history, and epidemiological information.  Fact Sheet for Patients:   StrictlyIdeas.no  Fact Sheet for Healthcare  Providers: BankingDealers.co.za  This test is not yet approved or  cleared by the [place] FDA and has been authorized for detection and/or diagnosis of SARS-CoV-2 by FDA under an Emergency Use Authorization (EUA).  This EUA will remain in effect (meaning this test can be used) for the duration of the COVID-19 declaration under Section 564(b)(1) of the Act, 21 U.S.C. section 360bbb-3(b)(1), unless the authorization is terminated or revoked sooner.  Performed at [facility], [address] [place], [place] [address]      Labs: Basic Metabolic Panel: Recent Labs  Lab 04/10/20 1755 04/11/20 0429 04/12/20 0056 04/13/20 1136 04/15/20 0428  NA 136 137 136 137  --   K 4.3 4.0 4.0 4.2  --   CL 98 106 103 103  --   CO2 26 23 24 26   --   GLUCOSE  114* 149* 109* 134*  --   BUN 24* 22 19 20   --   CREATININE 1.25* 1.27* 1.18* 0.97 1.17*  CALCIUM 10.0 8.8* 9.1 9.7  --   MG  --   --  1.7  --   --    Liver Function Tests: Recent Labs  Lab 04/10/20 1755 04/13/20 1136  AST 21 29  ALT 14 26  ALKPHOS 58 62  BILITOT 0.7 0.5  PROT 7.0 5.8*  ALBUMIN 4.0 3.1*   No results for input(s): LIPASE, AMYLASE in the last 168 hours. Recent Labs  Lab 04/13/20 1136  AMMONIA 28   CBC: Recent Labs  Lab 04/10/20 1755 04/11/20 0429 04/12/20 1144 04/13/20 1136  WBC 7.3 4.9 5.6 5.1  NEUTROABS 5.9  --   --  2.7  HGB 12.4 9.9* 11.1* 11.2*  HCT 37.7 30.3* 33.0* 33.3*  MCV 99.0 99.7 98.5 98.2  PLT 227 179 189 188   Cardiac Enzymes: No results for input(s): CKTOTAL, CKMB, CKMBINDEX, TROPONINI in the last 168 hours. BNP: BNP (last 3 results) Recent Labs    04/12/20 0056  BNP 326.6*    ProBNP (last 3 results) No results for input(s): PROBNP in the last 8760 hours.  CBG: Recent Labs  Lab 04/14/20 0805 04/14/20 1150 04/14/20 1655 04/14/20 2100 04/15/20 0726  GLUCAP 83 155* 99 169* 98       Signed:  Kayleen Memos, MD [facility] 04/15/2020, 10:52 AM

## 2020-04-15 NOTE — Discharge Instructions (Signed)
Confusion °Confusion is the inability to think with the usual speed or clarity. People who are confused often describe their thinking as cloudy or unclear. Confusion can also include feeling disoriented. This means you are unaware of where you are or who you are. You may also not know the date or time. When confused, you may have difficulty remembering, paying attention, or making decisions. Some people also act aggressively when they are confused. °In some cases, confusion may come on quickly. In other cases, it may develop slowly over time. How quickly confusion comes on depends on the cause. °Confusion may be caused by: °· Head injury (concussion). °· Seizures. °· Stroke. °· Fever. °· Brain tumor. °· Decrease in brain function due to a vascular or neurologic condition (dementia). °· Emotions, like rage or terror. °· Inability to know what is real and what is not (hallucinations). °· Infections, such as a urinary tract infection (UTI). °· Using too much alcohol, drugs, or medicines. °· Loss of fluid (dehydration) or an imbalance of salts in the body (electrolytes). °· Lack of sleep. °· Low blood sugar (diabetes). °· Low levels of oxygen. This comes from conditions such as chronic lung disorders. °· Side effects of medicines, or taking medicines that affect other medicines (drug interactions). °· Lack of certain nutrients, especially niacin, thiamine, vitamin C, or vitamin B. °· Sudden drop in body temperature (hypothermia). °· Change in routine, such as traveling or being hospitalized. °Follow these instructions at home: °Pay attention to your symptoms. Tell your health care provider about any changes or if you develop new symptoms. Follow these instructions to control or treat symptoms. Ask a family member or friend for help if needed. °Medicines °· Take over-the-counter and prescription medicines only as told by your health care provider. °· Ask your health care provider about changing or stopping any medicines  that may be causing your confusion. °· Avoid pain medicines or sleep medicines until you have fully recovered. °· Use a pillbox or an alarm to help you take the right medicines at the right time. °Lifestyle ° °· Eat a balanced diet that includes fruits and vegetables. °· Get enough sleep. For most adults, this is 7-9 hours each night. °· Do not drink alcohol. °· Do not become isolated. Spend time with other people and make plans for your days. °· Do not drive until your health care provider says that it is safe to do so. °· Do not use any products that contain nicotine or tobacco, such as cigarettes and e-cigarettes. If you need help quitting, ask your health care provider. °· Stop other activities that may increase your chances of getting hurt. These may include some work duties, sports activities, swimming, or bike riding. Ask your health care provider what activities are safe for you. °What caregivers can do °· Find out if the person is confused. Ask the person to state his or her name, age, and the date. If the person is unsure or answers incorrectly, he or she may be confused. °· Always introduce yourself, no matter how well the person knows you. °· Remind the person of his or her location. Do this often. °· Place a calendar and clock near the person who is confused. °· Talk about current events and plans for the day. °· Keep the environment calm, quiet, and peaceful. °· Help the person do the things that he or she is unable to do. These include: °? Taking medicines. °? Keeping follow-up visits with his or her health care   provider. ? Helping with household duties, including meal preparation. ? Running errands.  Get help if you need it. There are several support groups for caregivers.  If the person you are helping needs more support, consider day care, extended care programs, or a skilled nursing facility. The person's health care provider may be able to help evaluate these options. General  instructions  Monitor yourself for any conditions you may have. These may include: ? Checking your blood glucose levels, if you have diabetes. ? Watching your weight, if you are overweight. ? Monitoring your blood pressure, if you have hypertension. ? Monitoring your body temperature, if you have a fever.  Keep all follow-up visits as told by your health care provider. This is important. Contact a health care provider if:  Your symptoms get worse. Get help right away if you:  Feel that you are not able to care for yourself.  Develop severe headaches, repeated vomiting, seizures, blackouts, or slurred speech.  Have increasing confusion, weakness, numbness, restlessness, or personality changes.  Develop a loss of balance, have marked dizziness, feel uncoordinated, or fall.  Develop severe anxiety, or you have delusions or hallucinations. These symptoms may represent a serious problem that is an emergency. Do not wait to see if the symptoms will go away. Get medical help right away. Call your local emergency services (911 in the [place] Do not drive yourself to the hospital. Summary  Confusion is the inability to think with the usual speed or clarity. People who are confused often describe their thinking as cloudy or unclear.  Confusion can also include having difficulty remembering, paying attention, or making decisions.  Confusion may come on quickly or develop slowly over time, depending on the cause. There are many different causes of confusion.  Ask for help from family members or friends if you are unable to take care of yourself. This information is not intended to replace advice given to you by your health care provider. Make sure you discuss any questions you have with your health care provider. Document Revised: 06/20/2017 Document Reviewed: 06/20/2017 Elsevier Patient Education  [place].   Delirium Delirium is a state of mental confusion. It comes on quickly  and causes significant changes in a person's thinking and behavior. People with delirium usually have trouble paying attention to what is going on or knowing where they are. They may become very withdrawn or very emotional and unable to sit still. They may even see or feel things that are not there (hallucinations). Delirium is a sign of a serious underlying medical condition. What are the causes? Delirium occurs when something suddenly affects the signals that the brain sends out. Brain signals can be affected by anything that puts severe stress on the body and brain and causes brain chemicals to be out of balance. The most common causes of delirium include:  Infections. These may be bacterial, viral, fungal, or protozoal.  Medicines. These include many over-the-counter and prescription medicines.  Recreational drugs.  Substance withdrawal. This occurs with sudden discontinuation of alcohol, certain medicines, or recreational drugs.  Surgery and anesthesia.  Sudden vascular events, such as stroke and brain hemorrhage.  Other brain disorders, such as migraines, tumors, seizures, and physical head trauma.  Metabolic disorders, such as kidney or liver failure.  Low blood oxygen (anoxia). This may occur with lung disease, cardiac arrest, or carbon monoxide poisoning.  Hormone imbalances (endocrinopathies), such as an overactive thyroid (hyperthyroidism) or underactive thyroid (hypothyroidism).  Vitamin deficiencies. What increases the  risk? The following factors may make someone more likely to develop this condition.  Being a child.  Being an older person.  Living alone.  Having vision loss or hearing loss.  Having an existing brain disease, such as dementia.  Having long-lasting (chronic) medical conditions, such as heart disease.  Being hospitalized for long periods of time. What are the signs or symptoms? Delirium starts with a sudden change in a person's thinking or  behavior. Symptoms include:  Not being able to stay awake (drowsiness) or pay attention.  Being confused about places, time, and people.  Forgetfulness.  Having extreme energy levels. These may be low or high.  Changes in sleep patterns.  Extreme mood swings, such as sudden anger or anxiety.  Focusing on things or ideas that are not important.  Rambling and senseless talking.  Difficulty speaking, understanding speech, or both.  Hallucinations.  Tremor or unsteady gait. Symptoms come and go (fluctuate) over time, and they are often worse at the end of the day. How is this diagnosed? People with delirium may not realize that they have the condition. Often, a family member or health care provider is the first person to notice the changes. This condition may be diagnosed based on a physical exam, health history, and tests.  The health care provider will obtain a detailed history. This may include questions about: ? Current symptoms. ? Medical issues. ? Medicines. ? Recreational drug use.  The health care provider will perform a mental status examination by: ? Asking questions to check for confusion. ? Watching for abnormal behavior.  The health care provider may also order lab tests or additional studies to determine the cause of the delirium. How is this treated? Treatment of delirium depends on the cause and severity. Delirium usually goes away within days or weeks of treating the underlying cause. In the meantime, do not leave the person alone because he or she may accidentally cause self-harm. This condition may be treated with supportive care, such as:  Increased light during the day and decreased light at night.  Low noise level.  Uninterrupted sleep.  A regular daily schedule.  Clocks and calendars to help with orientation.  Familiar objects, including the person's pictures and clothing.  Frequent visits from familiar family and friends.  A healthy  diet.  Gentle exercise. In more severe cases of delirium, medicine may be prescribed to help the person keep calm and think more clearly. Follow these instructions at home:  Continue supportive care as told by a health care provider.  Over-the-counter and prescription medicines should be taken only as told by a health care provider.  Ask a health care provider before using herbs or supplements.  Do not use alcohol or recreational drugs.  Keep all follow-up visits as told by a health care provider. This is important. Contact a health care provider if:  Symptoms do not get better or they become worse.  New symptoms of delirium develop.  Caring for the person at home does not seem safe.  Eating, drinking, or communicating stops.  There are side effects of medicines, such as changes in sleep patterns, dizziness, weight gain, restlessness, movement changes, or tremors. Get help right away if:  Serious thoughts occur about self-harm or about hurting others.  There are serious side effects of medicine, such as: ? Swelling of the face, lips, tongue, or throat. ? Fever, confusion, muscle spasms, or seizures. Summary  Delirium is a state of mental confusion. It comes on quickly and  causes significant changes in a person's thinking and behavior.  Delirium is a sign of a serious underlying medical condition.  Certain medical conditions or a long hospital stay may increase the risk of developing delirium.  Treatment of delirium involves treating the underlying cause and providing supportive treatments, such as a calm and familiar environment. This information is not intended to replace advice given to you by your health care provider. Make sure you discuss any questions you have with your health care provider. Document Revised: 02/06/2018 Document Reviewed: 02/06/2018 Elsevier Patient Education  [place].

## 2020-04-15 NOTE — Progress Notes (Signed)
PHARMACIST - PHYSICIAN COMMUNICATION  CONCERNING:  Enoxaparin (Lovenox) for DVT Prophylaxis    RECOMMENDATION: Patient was prescribed enoxaprin 40mg  q24 hours for VTE prophylaxis.   Filed Weights   04/10/20 1720  Weight: 73.6 kg (162 lb 3.2 oz)    Body mass index is 28.73 kg/m.  Estimated Creatinine Clearance: 29.5 mL/min (A) (by C-G formula based on SCr of 1.17 mg/dL (H)).   Patient is candidate for enoxaparin 30mg  every 24 hours based on CrCl <3ml/min   DESCRIPTION: Pharmacy has adjusted enoxaparin dose per [facility] policy.  Patient is now receiving enoxaparin 30 mg every 24 hours   Pernell Dupre, PharmD, BCPS Clinical Pharmacist 04/15/2020 7:44 AM

## 2020-04-15 NOTE — Progress Notes (Addendum)
Dementia is the primary impairment diagnosis and supersedes Depression.   Patient's DOB is 04/21/1926

## 2020-04-15 NOTE — TOC Progression Note (Addendum)
Transition of Care [facility]) - Progression Note    Patient Details  Name: Erica Donovan MRN: [id number] Date of Birth: Jun 17, 1926  Transition of Care [facility]) CM/SW [place], RN Phone Number: 04/15/2020, 3:31 PM  Clinical Narrative:  Patient's PASSR level 2, facility is unsure if they can take her until this is cleared. They are checking with corporate and will inform when able.    4:15pm- Update facility will not be able to accept until PASSR number is generated. Patient will be here for the weekend.     Expected Discharge Plan: [place] Barriers to Discharge: Continued Medical Work up  Expected Discharge Plan and Services Expected Discharge Plan: [place] arrangements for the past 2 months: Single Family Home Expected Discharge Date: 04/15/20                                     Social Determinants of Health (SDOH) Interventions    Readmission Risk Interventions No flowsheet data found.

## 2020-04-15 NOTE — Progress Notes (Signed)
[facility]  Patient Description:[name] is a 86 year old female with PMH significant for paroxysmal SVT, HTN, HLD, DM Type II, COPD and CKD stage II who presented to [facility] ED  with altered mental status. The patient was found to be febrile and hypertensive. ACS workup was unremarkable with high sensitivity troponin's 8>> and CXR was unremarkable. CT of the chest was negative for PE. MRI was negative for acute CVA. The patient was found to be in A-Fib with RVR and was initially treated with IV metoprolol without success; therefore, Cardiology was consulted for further evaluation.  SUBJECTIVE: The patient reports to be doing well on today and that she was able to rest well on last night. She denies any chest pain, palpitations or dizziness at this time. The daughter reports that the patient slept well last night and did not have any episodes of severe confusion. The daughter reports that she is in agreement with the patient being discharged to a rehabilitation center and is hoping this will only be short-term. She reports that the patient has not gotten out of the hospital bed much and appears very weak.   (04/14/20) -The patient reports to be doing much better on today but c/o feeling "tired" and "sleepy." She denies all chest pain, dyspnea, palpitations, dizziness or syncope at this time. The patient's daughter reports that the patient had a significantly better night, she rested well, was more oriented and was not attempting to get out of bed.   (04/13/20) -The patient reports that she is doing well on today and without any complaints at this time. The patient's daughter reports that the patient had a "rough" night. She reports that the pt became increasingly confused, having hallucinations and attempting to get out of bed. She also reports that the patient experienced some wheezing on last night and has had a poor appetite since her admission. The daughter expresses concerns over the patient's  mental status because this is such a drastic change from her baseline cognitive status.   OBJECTIVE: The patient appears to be doing much better on today and is resting comfortably in bed. The patient's ECG reveals NSR to SB with a HR around 60-61 bpm. The patient's vital signs are stable, physical exam unremarkable and she is in no apparent distress.   (04/14/20) -The patient's mental status appears to be improved on today as she is now AOx2. She appears fairly well, appears euvolemic and she is resting comfortably in bed without any distress. Her bedside telemetry monitor reveals NSR with a HR of 63 and she is normotensive.   (04/13/20) -The patient appears fairly well on today, is resting comfortably in bed and is in no apparent distress. The patient is Alert and oriented to herself and she in aware that she is in the hospital, but does not recall the name of the hospital or the city. The telemetry monitor reveals atrial fibrillation with a HR of 114 bpm and the patient is slightly hypertensive at this time.   Vitals:   04/14/20 0806 04/14/20 1654 04/14/20 2349 04/15/20 0722  BP: (!) 153/73 127/64 (!) 116/55 140/66  Pulse: 66 60 65 64  Resp: 15 17 20 17   Temp: 98.2 F (36.8 C) 98.8 F (37.1 C) 98.4 F (36.9 C) 98.4 F (36.9 C)  TempSrc: Oral Oral Oral Oral  SpO2: 96% 97% 96% 96%  Weight:      Height:         Intake/Output Summary (Last 24 hours) at 04/15/2020 3016  Last data filed at 04/14/2020 1347 Gross per 24 hour  Intake 60 ml  Output --  Net 60 ml      PHYSICAL EXAM General: Well developed, well nourished, in no acute distress HEENT:  Normocephalic and atramatic; No JVD; supple; carotid +2 Lungs: Clear bilaterally to auscultation; chest expansion symmetrical Heart: regular heart rate and rhythm . Normal S1 and S2 without gallops or murmurs.  Abdomen: Bowel sounds are positive, abdomen soft and non-tender  Msk:  Normal strength and tone for age. Extremities: No  clubbing, cyanosis or edema.   Neuro: Alert and oriented X 2 Psych:  Good affect, responds appropriately   LABS: Basic Metabolic Panel: Recent Labs    04/13/20 1136 04/15/20 0428  NA 137  --   K 4.2  --   CL 103  --   CO2 26  --   GLUCOSE 134*  --   BUN 20  --   CREATININE 0.97 1.17*  CALCIUM 9.7  --    Liver Function Tests: Recent Labs    04/13/20 1136  AST 29  ALT 26  ALKPHOS 62  BILITOT 0.5  PROT 5.8*  ALBUMIN 3.1*   No results for input(s): LIPASE, AMYLASE in the last 72 hours. CBC: Recent Labs    04/12/20 1144 04/13/20 1136  WBC 5.6 5.1  NEUTROABS  --  2.7  HGB 11.1* 11.2*  HCT 33.0* 33.3*  MCV 98.5 98.2  PLT 189 188   Cardiac Enzymes: No results for input(s): CKTOTAL, CKMB, CKMBINDEX, TROPONINI in the last 72 hours. BNP: Invalid input(s): POCBNP D-Dimer: No results for input(s): DDIMER in the last 72 hours. Hemoglobin A1C: No results for input(s): HGBA1C in the last 72 hours. Fasting Lipid Panel: No results for input(s): CHOL, HDL, LDLCALC, TRIG, CHOLHDL, LDLDIRECT in the last 72 hours. Thyroid Function Tests: No results for input(s): TSH, T4TOTAL, T3FREE, THYROIDAB in the last 72 hours.  Invalid input(s): FREET3 Anemia Panel: Recent Labs    04/12/20 1144  VITAMINB12 383  RETICCTPCT 1.8    No results found.     TELEMETRY: NSR to SB with HR in the lower 60's  ASSESSMENT AND PLAN:  Active Problems:   AMS (altered mental status)   Acute encephalopathy  Atrial Fibrillation with RVR HTN HLD Chronic partial thickness wounds DM type II Anemia   1.Atrial Fibrillation with RVR: fairly stable, patient is asymptomatic             -continue Amiodarone load orally 200mg  BID.  -decrease metoprolol tartrate to 12.5mg  BID.  -Monitor vss q4h.              -Agree with continuous Telemetry monitoring.              -Echocardiogram reveals normal LV systolic function with estimated EF of 55-60% with Grade I diastolic dysfunction and mild MS.                -CHA2DS@-VASc score of 5; however, due to h/o recent subdural hematoma, anticoagulation will not be started at this time.   -Recommend trending CMP and CBC. Hgb is stable.   2. HTN: stable at this time, patient is normotensive              - Continue Metoprolol tartrate.              -monitor VS q4h.             -low sodium, heart healthy diet.  3. HLD: stable             -continue atorvastatin.   4. AMS in the setting of mild dementia, likely related to delirium   -Neurology consult and recs appreciated.   - CVA and UTI ruled out.             -blood Cultures revealed no growth.              -Urine culture reveals multiple species.  -Recommend getting the patient out of bed for meals and ambulating her in the room.                            5. Chronic partial thickness wound:                          -Recommendations per WOC appreciated.   6. DM Type II: stable             -Agree with sliding scale insulin therapy and CBG monitoring.   7. Anemia: stable             -Continue to trend CBC.   -HGB is stable.     [place], [id number]-AG  04/15/2020 8:51 AM

## 2020-04-16 LAB — GLUCOSE, CAPILLARY
Glucose-Capillary: 96 mg/dL (ref 70–99)
Glucose-Capillary: 138 mg/dL — ABNORMAL HIGH (ref 70–99)
Glucose-Capillary: 124 mg/dL — ABNORMAL HIGH (ref 70–99)
Glucose-Capillary: 160 mg/dL — ABNORMAL HIGH (ref 70–99)

## 2020-04-16 NOTE — Progress Notes (Signed)
Discharge Summary  Erica Donovan UUV:[id number] DOB: October 17, 1925  PCP: Juluis Pitch, MD  Admit date: 04/10/2020 Discharge date: 04/16/2020  Time spent: 35 minutes   Recommendations for Outpatient Follow-up:  1. Follow-up with cardiology in 1 to 2 weeks. 2. Follow-up with your primary care provider in 1 to 2 weeks. 3. Continue PT OT with assistance and fall precautions 4. Take your medications as prescribed  Discharge Diagnoses:  Active Hospital Problems   Diagnosis Date Noted  . Acute encephalopathy 04/11/2020  . AMS (altered mental status) 04/10/2020    Resolved Hospital Problems  No resolved problems to display.    Discharge Condition: Stable  Diet recommendation: Resume previous diet.  Vitals:   04/15/20 2225 04/16/20 0800  BP: 135/64 136/64  Pulse: 74 72  Resp: 18 19  Temp: 98.5 F (36.9 C) 98.4 F (36.9 C)  SpO2: 96% 95%    History of present illness:  86 year old lady with prior history of hypertension hyperlipidemia, COPD and type 2 diabetes presents with altered mental status, confusion and generalized weakness along with fever Tmax 100.4. Labs were significant for AKI and urine was abnormal showing trace leukocytes and rare bacteria. She was admitted for further evaluation of acute metabolic encephalopathy. Seen by neurology, thought her altered mental status was secondary to delirium in the setting of dementia.  Started was added on Seroquel 12.5 mg BID with improvement of her mood.  04/16/20:   Seen at her bedside with her daughter present.  Alert and interactive.  She has a nice smile on her face.  She has no new complaints.  Awaiting bed placement.  Hospital Course:  Active Problems:   AMS (altered mental status)   Acute encephalopathy  Resolved acute metabolic encephalopathy likely secondary to delirium in the setting of dementia. Work-up has been unrevealing MRI negative for acute CVA, urine culture multiple species present, ammonia level  normal.  Per her daughter she has reported neck pain and headache for which she was taking Tylenol.  History of varicella, no hx of zoster, had her immunization.  Seen by neurology, no need for further work up.  Likely related to delirum.  Continue delirium precautions. Started Seroquel on 04/13/20 with improvement of her mood.  Paroxysmal A. fib with resolved RVR Seen by cardiology Continue amiodarone 200 mg twice daily until cardiology follow-up outpatient. Rate controlled on p.o. Lopressor 12.5 mg twice daily Follow-up with cardiology  Hyperlipidemia LDL 136 on 04/11/2020 Started on Lipitor 20 mg daily per cardiology Follow-up with cardiology or your PCP  Physical debility/ambulatory dysfunction PT assessment recommended SNF Continue PT OT with assistance and fall precautions.  Chronic anxiety Continue home Lexapro  Dementia with resolved behavioral disturbance while on Seroquel Continue Seroquel 12.5 mg twice daily  Chronic diastolic CHF Last 2D echo done on 04/11/2020 showed LVEF 55 to 60% with grade 1 diastolic dysfunction with mild mitral stenosis. Euvolemic on exam Follow-up with cardiology  Essential hypertension BP is at goal. Continue amiodarone and p.o. Lopressor.  Moderate protein calorie malnutrition BMI 28, albumin 3.1 Continue oral supplement Continue to Encourage oral intake    Code Status: DNR  Family Communication: Daughter updated at bedside   Consultants:  Neurology  Cardiology  Palliative care medicine  Procedures:  None  Antimicrobials:  None   Discharge Exam: No change from prior exam BP 136/64 (BP Location: Right Arm)   Pulse 72   Temp 98.4 F (36.9 C)   Resp 19   Ht 5\' 3"  (1.6 m)  Wt 73.6 kg   SpO2 95%   BMI 28.73 kg/m  . General: 86 y.o. year-old female well developed well nourished in no acute distress.  Alert and pleasantly confused. . Cardiovascular: Regular rate and rhythm with no rubs or  gallops.  No thyromegaly or JVD noted.   Marland Kitchen Respiratory: Clear to auscultation with no wheezes or rales. Good inspiratory effort. . Abdomen: Soft nontender nondistended with normal bowel sounds x4 quadrants. . Musculoskeletal: No lower extremity edema. 2/4 pulses in all 4 extremities. Marland Kitchen Psychiatry: Mood is appropriate for condition and setting  Discharge Instructions You were cared for by a hospitalist during your hospital stay. If you have any questions about your discharge medications or the care you received while you were in the hospital after you are discharged, you can call the unit and asked to speak with the hospitalist on call if the hospitalist that took care of you is not available. Once you are discharged, your primary care physician will handle any further medical issues. Please note that NO REFILLS for any discharge medications will be authorized once you are discharged, as it is imperative that you return to your primary care physician (or establish a relationship with a primary care physician if you do not have one) for your aftercare needs so that they can reassess your need for medications and monitor your lab values.   Allergies as of 04/16/2020      Reactions   Penicillins Nausea Only   Did it involve swelling of the face/tongue/throat, SOB, or low BP? Unknown Did it involve sudden or severe rash/hives, skin peeling, or any reaction on the inside of your mouth or nose? Unknown Did you need to seek medical attention at a hospital or doctor's office? Unknown When did it last happen?Many years ago. If all above answers are "NO", may proceed with cephalosporin use.   Ciprofloxacin Nausea Only   Nitrofurantoin Itching, Rash      Medication List    STOP taking these medications   bismuth subsalicylate 937 JI/96VE suspension Commonly known as: PEPTO BISMOL   carboxymethylcellulose 0.5 % Soln Commonly known as: REFRESH PLUS   cetirizine 10 MG tablet Commonly known as:  ZYRTEC   HYDROcodone-acetaminophen 5-325 MG tablet Commonly known as: Norco   LORazepam 0.5 MG tablet Commonly known as: ATIVAN   meloxicam 7.5 MG tablet Commonly known as: MOBIC   sulfamethoxazole-trimethoprim 800-160 MG tablet Commonly known as: BACTRIM DS   vitamin C 500 MG tablet Commonly known as: ASCORBIC ACID     TAKE these medications   acetaminophen 500 MG tablet Commonly known as: TYLENOL Take 500-1,000 mg by mouth every 6 (six) hours as needed for mild pain.   amiodarone 200 MG tablet Commonly known as: PACERONE Take 1 tablet (200 mg total) by mouth 2 (two) times daily.   aspirin 81 MG EC tablet Take 1 tablet (81 mg total) by mouth daily. Swallow whole. What changed: additional instructions   atorvastatin 20 MG tablet Commonly known as: LIPITOR Take 1 tablet (20 mg total) by mouth daily.   diphenhydrAMINE 25 MG tablet Commonly known as: BENADRYL Take 25 mg by mouth every 8 (eight) hours as needed for itching.   docusate sodium 100 MG capsule Commonly known as: COLACE Take 200 mg by mouth daily. And prn   escitalopram 20 MG tablet Commonly known as: LEXAPRO Take 20 mg by mouth every morning.   EX-LAX PO Take by mouth as needed.   feeding supplement Liqd Take 237 mLs  by mouth 2 (two) times daily between meals.   ferrous sulfate 325 (65 FE) MG tablet Take 1 tablet (325 mg total) by mouth 2 (two) times daily with a meal.   Fish Oil 1000 MG Caps Take 1,000 mg by mouth 3 (three) times daily.   loratadine 10 MG tablet Commonly known as: CLARITIN Take 10 mg by mouth every morning.   metoprolol tartrate 25 MG tablet Commonly known as: LOPRESSOR Take 0.5 tablets (12.5 mg total) by mouth 2 (two) times daily.   montelukast 10 MG tablet Commonly known as: SINGULAIR Take 1 tablet (10 mg total) by mouth at bedtime.   multivitamin with minerals Tabs tablet Take 1 tablet by mouth daily.   omeprazole 40 MG capsule Commonly known as: PRILOSEC Take  40 mg by mouth 2 (two) times a day.   QUEtiapine 25 MG tablet Commonly known as: SEROQUEL Take 0.5 tablets (12.5 mg total) by mouth at bedtime.      Allergies  Allergen Reactions  . Penicillins Nausea Only    Did it involve swelling of the face/tongue/throat, SOB, or low BP? Unknown Did it involve sudden or severe rash/hives, skin peeling, or any reaction on the inside of your mouth or nose? Unknown Did you need to seek medical attention at a hospital or doctor's office? Unknown When did it last happen?Many years ago. If all above answers are "NO", may proceed with cephalosporin use.   . Ciprofloxacin Nausea Only  . Nitrofurantoin Itching and Rash    Contact information for follow-up providers    Gladstone Pih, NP. Call in 1 day(s).   Why: Please call for a post hospital follow-up appointment. Contact information: [place] [address] [phone]        Juluis Pitch, MD. Call in 1 day(s).   Specialty: Family Medicine Why: Please call for a post hospital follow-up appointment. Contact information: [address] [place] [address] [phone]            Contact information for after-discharge care    Destination    [facility] .   Service: Skilled Nursing Contact information: [address] [place] [phone]                   The results of significant diagnostics from this hospitalization (including imaging, microbiology, ancillary and laboratory) are listed below for reference.    Significant Diagnostic Studies: DG Chest 1 View  Result Date: 04/12/2020 CLINICAL DATA:  Tachycardia EXAM: CHEST  1 VIEW COMPARISON:  04/10/2020 FINDINGS: The heart size and mediastinal contours are within normal limits. Both lungs are clear. The visualized skeletal structures are unremarkable. IMPRESSION: No active disease. Electronically Signed   By: Ulyses Jarred M.D.   On:  04/12/2020 00:39   CT Head Wo Contrast  Result Date: 04/10/2020 CLINICAL DATA:  86 year old female with altered mental status. EXAM: CT HEAD WITHOUT CONTRAST TECHNIQUE: Contiguous axial images were obtained from the base of the skull through the vertex without intravenous contrast. COMPARISON:  Head CT dated 11/27/2019. FINDINGS: Brain: Moderate age-related atrophy and chronic microvascular ischemic changes. There is no acute intracranial hemorrhage. No mass effect or midline shift. No extra-axial fluid collection. Vascular: No hyperdense vessel or unexpected calcification. Skull: Normal. Negative for fracture or focal lesion. Sinuses/Orbits: No acute finding. Other: None IMPRESSION: 1. No acute intracranial pathology. 2. Moderate age-related atrophy and chronic microvascular ischemic changes. Electronically Signed   By: Laren Everts.D.  On: 04/10/2020 21:44   CT ANGIO CHEST PE W OR WO CONTRAST  Result Date: 04/12/2020 CLINICAL DATA:  Tachycardia and fever with elevated D-dimer EXAM: CT ANGIOGRAPHY CHEST WITH CONTRAST TECHNIQUE: Multidetector CT imaging of the chest was performed using the standard protocol during bolus administration of intravenous contrast. Multiplanar CT image reconstructions and MIPs were obtained to evaluate the vascular anatomy. CONTRAST:  27mL OMNIPAQUE IOHEXOL 350 MG/ML SOLN COMPARISON:  None. FINDINGS: Cardiovascular: Contrast injection is sufficient to demonstrate satisfactory opacification of the pulmonary arteries to the segmental level. There is no pulmonary embolus or evidence of right heart strain. The size of the main pulmonary artery is normal. Heart size is normal, with no pericardial effusion. The course and caliber of the aorta are normal. There is mild atherosclerotic calcification. Opacification decreased due to pulmonary arterial phase contrast bolus timing. Mediastinum/Nodes: No mediastinal, hilar or axillary lymphadenopathy. Normal visualized thyroid.  Thoracic esophageal course is normal. Lungs/Pleura: Emphysema.  No pleural effusion. Upper Abdomen: Contrast bolus timing is not optimized for evaluation of the abdominal organs. The visualized portions of the organs of the upper abdomen are normal. Musculoskeletal: No chest wall abnormality. No bony spinal canal stenosis. Review of the MIP images confirms the above findings. IMPRESSION: 1. No pulmonary embolus or other acute thoracic abnormality. Aortic Atherosclerosis (ICD10-I70.0) and Emphysema (ICD10-J43.9). Electronically Signed   By: Ulyses Jarred M.D.   On: 04/12/2020 03:56   MR BRAIN WO CONTRAST  Result Date: 04/11/2020 CLINICAL DATA:  Initial evaluation for acute altered mental status, confusion. EXAM: MRI HEAD WITHOUT CONTRAST TECHNIQUE: Multiplanar, multiecho pulse sequences of the brain and surrounding structures were obtained without intravenous contrast. COMPARISON:  Prior head CT from 04/10/2020. FINDINGS: Brain: Examination mildly degraded by motion artifact. Generalized age-related cerebral atrophy. Patchy and confluent T2/FLAIR hyperintensity within the periventricular deep white matter both cerebral hemispheres most consistent with chronic small vessel ischemic disease, moderate in nature. No abnormal foci of restricted diffusion to suggest acute or subacute ischemia. Gray-white matter differentiation maintained. No encephalomalacia to suggest chronic cortical infarction. No foci of susceptibility artifact to suggest acute or chronic intracranial hemorrhage. No mass lesion, midline shift or mass effect. No hydrocephalus or extra-axial fluid collection. Pituitary gland suprasellar region normal. Midline structures intact. Vascular: Major intracranial vascular flow voids are maintained. Skull and upper cervical spine: Craniocervical junction within normal limits. Bone marrow signal intensity normal. No scalp soft tissue abnormality. Sinuses/Orbits: Patient status post bilateral ocular lens  replacement. Globes and orbital soft tissues demonstrate no acute finding. Mild scattered mucosal thickening noted within the ethmoidal air cells and right maxillary sinus. Paranasal sinuses are otherwise clear. No mastoid effusion. Inner ear structures grossly normal. Other: None. IMPRESSION: 1. No acute intracranial abnormality. 2. Age-related cerebral atrophy with moderate chronic microvascular ischemic disease. Electronically Signed   By: Jeannine Boga M.D.   On: 04/11/2020 01:44   US Carotid Bilateral (at [facility] and [facility] only)  Result Date: 04/11/2020 CLINICAL DATA:  86 year old female with confusion. EXAM: BILATERAL CAROTID DUPLEX ULTRASOUND TECHNIQUE: Pearline Cables scale imaging, color Doppler and duplex ultrasound were performed of bilateral carotid and vertebral arteries in the neck. COMPARISON:  03/03/2019 FINDINGS: Criteria: Quantification of carotid stenosis is based on velocity parameters that correlate the residual internal carotid diameter with NASCET-based stenosis levels, using the diameter of the distal internal carotid lumen as the denominator for stenosis measurement. The following velocity measurements were obtained: RIGHT ICA: Peak systolic velocity 76 cm/sec, End diastolic velocity 17 cm/sec CCA: Peak systolic velocity 63 cm/sec  SYSTOLIC ICA/CCA RATIO:  1.2 ECA: Peak systolic velocity 850 cm/sec LEFT ICA: Peak systolic velocity 89 cm/sec, End diastolic velocity 30 cm/sec CCA: 62 cm/sec SYSTOLIC ICA/CCA RATIO:  1.4 ECA: 92 cm/sec RIGHT CAROTID ARTERY: Minimal atherosclerotic plaque formation at the carotid bifurcation. No significant tortuosity. Normal low resistance waveforms. RIGHT VERTEBRAL ARTERY:  Antegrade flow. LEFT CAROTID ARTERY: Minimal atherosclerotic plaque formation at the carotid bifurcation. No significant tortuosity. Normal low resistance waveforms. LEFT VERTEBRAL ARTERY:  Antegrade flow. Upper extremity non-invasive blood pressures: Not obtained IMPRESSION: 1. Right carotid  artery system: Less than 50% stenosis secondary to minimal atherosclerotic plaque formation. 2. Left carotid artery system: Less than 50% stenosis secondary to minimal atherosclerotic plaque formation. 3.  Vertebral artery system: Patent with antegrade flow bilaterally. Ruthann Cancer, MD [facility] Electronically Signed   By: Ruthann Cancer MD   On: 04/11/2020 07:30   US Venous Img Lower Bilateral (DVT)  Result Date: 04/12/2020 CLINICAL DATA:  Bilateral leg pain.  Elevated D-dimer. EXAM: BILATERAL LOWER EXTREMITY VENOUS DOPPLER ULTRASOUND TECHNIQUE: Gray-scale sonography with compression, as well as color and duplex ultrasound, were performed to evaluate the deep venous system(s) from the level of the common femoral vein through the popliteal and proximal calf veins. COMPARISON:  None. FINDINGS: BILATERAL VENOUS Normal compressibility of the common femoral, superficial femoral, and popliteal veins, as well as the visualized calf veins. Visualized portions of profunda femoral vein and great saphenous vein unremarkable. No filling defects to suggest DVT on grayscale or color Doppler imaging. Doppler waveforms show normal direction of venous flow, normal respiratory plasticity and response to augmentation. OTHER Complex fluid collection in the right popliteal fossa measuring 3.6 x 1.1 x 2.0 cm, possibly representing a Bakers cyst. Limitations: none IMPRESSION: 1.  Negative for DVT in the bilateral lower extremities. 2. Complex collection in the right popliteal fossa measuring 3.6 cm, possibly a Bakers cyst. Electronically Signed   By: Audie Pinto M.D.   On: 04/12/2020 11:41   DG Chest Portable 1 View  Result Date: 04/10/2020 CLINICAL DATA:  86 year old female with fever. EXAM: PORTABLE CHEST 1 VIEW COMPARISON:  Chest radiograph dated 03/02/2019. FINDINGS: No focal consolidation, pleural effusion, or pneumothorax. Stable cardiac silhouette.  Coronary vascular calcification and atherosclerotic calcification of the aortic arch. No acute osseous pathology. IMPRESSION: No active disease. Electronically Signed   By: Anner Crete M.D.   On: 04/10/2020 18:23   ECHOCARDIOGRAM COMPLETE  Result Date: 04/12/2020    ECHOCARDIOGRAM REPORT   Patient Name:   ODALYS WIN [facility] Date of Exam: 04/11/2020 Medical Rec #:  [id number]       Height:       63.0 in Accession #:    [id number]      Weight:       162.2 lb Date of Birth:  1926/02/16       BSA:          1.769 m Patient Age:    60 years        BP:           137/68 mmHg Patient Gender: F               HR:           85 bpm. Exam Location:  [facility] Procedure: 2D Echo, Cardiac Doppler and Color Doppler Indications:     TIA 435.9 / G45.9  History:         Patient has prior history of Echocardiogram examinations.  Risk                  Factors:Hypertension.  Sonographer:     Alyse Low Roar Referring Phys:  [id number] Arvella Merles MANSY Diagnosing Phys: Yolonda Kida MD IMPRESSIONS  1. Left ventricular ejection fraction, by estimation, is 55 to 60%. The left ventricle has normal function. The left ventricle has no regional wall motion abnormalities. Left ventricular diastolic parameters are consistent with Grade I diastolic dysfunction (impaired relaxation).  2. Right ventricular systolic function is normal. The right ventricular size is normal.  3. The mitral valve is abnormal. Trivial mitral valve regurgitation. Mild mitral stenosis.  4. The aortic valve is normal in structure. Aortic valve regurgitation is not visualized. FINDINGS  Left Ventricle: Left ventricular ejection fraction, by estimation, is 55 to 60%. The left ventricle has normal function. The left ventricle has no regional wall motion abnormalities. The left ventricular internal cavity size was normal in size. There is  borderline left ventricular hypertrophy. Left ventricular diastolic parameters are consistent with Grade I diastolic dysfunction (impaired  relaxation). Right Ventricle: The right ventricular size is normal. No increase in right ventricular wall thickness. Right ventricular systolic function is normal. Left Atrium: Left atrial size was normal in size. Right Atrium: Right atrial size was normal in size. Pericardium: There is no evidence of pericardial effusion. Mitral Valve: The mitral valve is abnormal. There is mild thickening of the mitral valve leaflet(s). There is mild calcification of the mitral valve leaflet(s). Mild to moderate mitral annular calcification. Trivial mitral valve regurgitation. Mild mitral valve stenosis. Tricuspid Valve: The tricuspid valve is grossly normal. Tricuspid valve regurgitation is trivial. Aortic Valve: The aortic valve is normal in structure. Aortic valve regurgitation is not visualized. Aortic valve peak gradient measures 12.5 mmHg. Pulmonic Valve: The pulmonic valve was normal in structure. Pulmonic valve regurgitation is not visualized. Aorta: Aortic root could not be assessed. IAS/Shunts: The interatrial septum was not well visualized.  LEFT VENTRICLE PLAX 2D LVIDd:         3.82 cm  Diastology LVIDs:         2.64 cm  LV e' medial:    7.83 cm/s LV PW:         1.22 cm  LV E/e' medial:  15.8 LV IVS:        1.42 cm  LV e' lateral:   6.09 cm/s LVOT diam:     1.70 cm  LV E/e' lateral: 20.4 LVOT Area:     2.27 cm  RIGHT VENTRICLE RV Mid diam:    3.14 cm RV S prime:     12.80 cm/s TAPSE (M-mode): 2.2 cm LEFT ATRIUM             Index       RIGHT ATRIUM           Index LA diam:        3.30 cm 1.87 cm/m  RA Area:     12.20 cm LA Vol (A2C):   58.1 ml 32.84 ml/m RA Volume:   24.20 ml  13.68 ml/m LA Vol (A4C):   36.4 ml 20.58 ml/m LA Biplane Vol: 46.9 ml 26.51 ml/m  AORTIC VALVE                PULMONIC VALVE AV Area (Vmax): 1.69 cm    PV Vmax:        1.00 m/s AV Vmax:        177.00 cm/s PV Peak grad:  4.0 mmHg AV Peak Grad:   12.5 mmHg   RVOT Peak grad: 3 mmHg LVOT Vmax:      132.00 cm/s  AORTA Ao Root diam: 2.80 cm  MITRAL VALVE                TRICUSPID VALVE MV Area (PHT): 4.17 cm     TR Peak grad:   38.9 mmHg MV Decel Time: 182 msec     TR Vmax:        312.00 cm/s MV E velocity: 124.00 cm/s MV A velocity: 98.50 cm/s   SHUNTS MV E/A ratio:  1.26         Systemic Diam: 1.70 cm MV A Prime:    10.7 cm/s Yolonda Kida MD Electronically signed by Yolonda Kida MD Signature Date/Time: 04/12/2020/1:20:48 PM    Final     Microbiology: Recent Results (from the past 240 hour(s))  Blood culture (routine x 2)     Status: None   Collection Time: 04/10/20  5:55 PM   Specimen: BLOOD  Result Value Ref Range Status   Specimen Description BLOOD RIGHT [facility]  Final   Special Requests   Final    BOTTLES DRAWN AEROBIC AND ANAEROBIC Blood Culture adequate volume   Culture   Final    NO GROWTH 5 DAYS Performed at [facility], [address], [place], [place] [address]    Report Status 04/15/2020 FINAL  Final  Blood culture (routine x 2)     Status: None   Collection Time: 04/10/20  5:55 PM   Specimen: BLOOD  Result Value Ref Range Status   Specimen Description BLOOD LEFT [facility]  Final   Special Requests   Final    BOTTLES DRAWN AEROBIC AND ANAEROBIC Blood Culture adequate volume   Culture   Final    NO GROWTH 5 DAYS Performed at [facility], [address] [place], [place] [address]    Report Status 04/15/2020 FINAL  Final  Respiratory Panel by RT PCR (Flu A&B, Covid) - Nasopharyngeal Swab     Status: None   Collection Time: 04/10/20  5:55 PM   Specimen: Nasopharyngeal Swab  Result Value Ref Range Status   SARS Coronavirus 2 by RT PCR NEGATIVE NEGATIVE Final    Comment: (NOTE) SARS-CoV-2 target nucleic acids are NOT DETECTED.  The SARS-CoV-2 RNA is generally detectable in upper respiratoy specimens during the acute phase of infection. The lowest concentration of SARS-CoV-2 viral copies this assay can detect is 131 copies/mL. A negative result does not preclude SARS-Cov-2 infection and  should not be used as the sole basis for treatment or other patient management decisions. A negative result may occur with  improper specimen collection/handling, submission of specimen other than nasopharyngeal swab, presence of viral mutation(s) within the areas targeted by this assay, and inadequate number of viral copies (<131 copies/mL). A negative result must be combined with clinical observations, patient history, and epidemiological information. The expected result is Negative.  Fact Sheet for Patients:  PinkCheek.be  Fact Sheet for Healthcare Providers:  GravelBags.it  This test is no t yet approved or cleared by the [place] FDA and  has been authorized for detection and/or diagnosis of SARS-CoV-2 by FDA under an Emergency Use Authorization (EUA). This EUA will remain  in effect (meaning this test can be used) for the duration of the COVID-19 declaration under Section 564(b)(1) of the Act, 21 U.S.C. section 360bbb-3(b)(1), unless the authorization is terminated or revoked sooner.  Influenza A by PCR NEGATIVE NEGATIVE Final   Influenza B by PCR NEGATIVE NEGATIVE Final    Comment: (NOTE) The Xpert Xpress SARS-CoV-2/FLU/RSV assay is intended as an aid in  the diagnosis of influenza from Nasopharyngeal swab specimens and  should not be used as a sole basis for treatment. Nasal washings and  aspirates are unacceptable for Xpert Xpress SARS-CoV-2/FLU/RSV  testing.  Fact Sheet for Patients: PinkCheek.be  Fact Sheet for Healthcare Providers: GravelBags.it  This test is not yet approved or cleared by the [place] FDA and  has been authorized for detection and/or diagnosis of SARS-CoV-2 by  FDA under an Emergency Use Authorization (EUA). This EUA will remain  in effect (meaning this test can be used) for the duration of the  Covid-19 declaration  under Section 564(b)(1) of the Act, 21  U.S.C. section 360bbb-3(b)(1), unless the authorization is  terminated or revoked. Performed at [facility], [address] [place], [place] [address]   Urine Culture     Status: Abnormal   Collection Time: 04/10/20  5:55 PM   Specimen: Urine, Random  Result Value Ref Range Status   Specimen Description   Final    URINE, RANDOM Performed at [facility], [address], [place], [place] [address]    Special Requests   Final    NONE Performed at [facility], [address] [place], [place] [address]    Culture MULTIPLE SPECIES PRESENT, SUGGEST RECOLLECTION (A)  Final   Report Status 04/11/2020 FINAL  Final  SARS Coronavirus 2 by RT PCR (hospital order, performed in [facility] hospital lab) Nasopharyngeal Nasopharyngeal Swab     Status: None   Collection Time: 04/14/20  3:51 PM   Specimen: Nasopharyngeal Swab  Result Value Ref Range Status   SARS Coronavirus 2 NEGATIVE NEGATIVE Final    Comment: (NOTE) SARS-CoV-2 target nucleic acids are NOT DETECTED.  The SARS-CoV-2 RNA is generally detectable in upper and lower respiratory specimens during the acute phase of infection. The lowest concentration of SARS-CoV-2 viral copies this assay can detect is 250 copies / mL. A negative result does not preclude SARS-CoV-2 infection and should not be used as the sole basis for treatment or other patient management decisions.  A negative result may occur with improper specimen collection / handling, submission of specimen other than nasopharyngeal swab, presence of viral mutation(s) within the areas targeted by this assay, and inadequate number of viral copies (<250 copies / mL). A negative result must be combined with clinical observations, patient history, and epidemiological information.  Fact Sheet for Patients:   StrictlyIdeas.no  Fact Sheet for Healthcare  Providers: BankingDealers.co.za  This test is not yet approved or  cleared by the [place] FDA and has been authorized for detection and/or diagnosis of SARS-CoV-2 by FDA under an Emergency Use Authorization (EUA).  This EUA will remain in effect (meaning this test can be used) for the duration of the COVID-19 declaration under Section 564(b)(1) of the Act, 21 U.S.C. section 360bbb-3(b)(1), unless the authorization is terminated or revoked sooner.  Performed at [facility], [address] [place], [place] [address]      Labs: Basic Metabolic Panel: Recent Labs  Lab 04/10/20 1755 04/11/20 0429 04/12/20 0056 04/13/20 1136 04/15/20 0428  NA 136 137 136 137  --   K 4.3 4.0 4.0 4.2  --   CL 98 106 103 103  --   CO2 26 23 24 26   --   GLUCOSE  114* 149* 109* 134*  --   BUN 24* 22 19 20   --   CREATININE 1.25* 1.27* 1.18* 0.97 1.17*  CALCIUM 10.0 8.8* 9.1 9.7  --   MG  --   --  1.7  --   --    Liver Function Tests: Recent Labs  Lab 04/10/20 1755 04/13/20 1136  AST 21 29  ALT 14 26  ALKPHOS 58 62  BILITOT 0.7 0.5  PROT 7.0 5.8*  ALBUMIN 4.0 3.1*   No results for input(s): LIPASE, AMYLASE in the last 168 hours. Recent Labs  Lab 04/13/20 1136  AMMONIA 28   CBC: Recent Labs  Lab 04/10/20 1755 04/11/20 0429 04/12/20 1144 04/13/20 1136  WBC 7.3 4.9 5.6 5.1  NEUTROABS 5.9  --   --  2.7  HGB 12.4 9.9* 11.1* 11.2*  HCT 37.7 30.3* 33.0* 33.3*  MCV 99.0 99.7 98.5 98.2  PLT 227 179 189 188   Cardiac Enzymes: No results for input(s): CKTOTAL, CKMB, CKMBINDEX, TROPONINI in the last 168 hours. BNP: BNP (last 3 results) Recent Labs    04/12/20 0056  BNP 326.6*    ProBNP (last 3 results) No results for input(s): PROBNP in the last 8760 hours.  CBG: Recent Labs  Lab 04/15/20 1132 04/15/20 1702 04/15/20 2130 04/16/20 0802 04/16/20 1147  GLUCAP 165* 110* 143* 96 138*       Signed:  Kayleen Memos, MD [facility] 04/16/2020, 1:53 PM

## 2020-04-16 NOTE — Progress Notes (Signed)
Physical Therapy Treatment Patient Details Name: Erica Donovan MRN: [id number] DOB: 1925/09/05 Today's Date: 04/16/2020    History of Present Illness Pt is a 86 yo female that presented to the ED for AMS per family, weakness, fever. Did experience nausea and vomiting as well.   PMH includes COPD, depression, dementia, DM, GERD, HLD, HTN as well as recent R wrist fracture and ORIF.    PT Comments    Pt was sitting in recliner upon arriving. She is HOH but able to carry on conversation throughout  Session. A and O x 3. Agreeable to OOB activity and motivated to improve so she can return home. Pt was able to stand to RW without assistance, ambulate 150 ft without LOB or unsteadiness. Much improved safety with mobility, transfers, and gait. Prior to today's session, PT has been recommending DC to SNF. If pt is able to have assistance at home and supervision for mobility, may be able to return to home environment with HHPT to follow. If pt does not have assistance at home, will need  SNF to address deficits and improve safety with focus on balance.She was in bed at conclusion of session with call bell in reach and bed alarm in place.    Follow Up Recommendations  Home health PT;Supervision/Assistance - 24 hour;Supervision for mobility/OOB;SNF     Equipment Recommendations  None recommended by PT    Recommendations for Other Services       Precautions / Restrictions Precautions Precautions: Fall Restrictions Weight Bearing Restrictions: No    Mobility  Bed Mobility Overal bed mobility: Needs Assistance Bed Mobility: Sit to Supine       Sit to supine: Supervision;HOB elevated      Transfers Overall transfer level: Needs assistance Equipment used: Rolling walker (2 wheeled) Transfers: Sit to/from Stand Sit to Stand: Supervision         General transfer comment: pt was able to stand from recliner to RW without assistance  Ambulation/Gait Ambulation/Gait assistance: Min  guard;Supervision Gait Distance (Feet): 150 Feet Assistive device: Rolling walker (2 wheeled) Gait Pattern/deviations: WFL(Within Functional Limits) Gait velocity: decreased   General Gait Details: CGA at first priogressing to supervision only         Balance Overall balance assessment: Needs assistance Sitting-balance support: Feet supported;Bilateral upper extremity supported Sitting balance-Leahy Scale: Good     Standing balance support: Bilateral upper extremity supported;During functional activity Standing balance-Leahy Scale: Good                 Cognition Arousal/Alertness: Awake/alert Behavior During Therapy: WFL for tasks assessed/performed Overall Cognitive Status: History of cognitive impairments - at baseline                                 General Comments: pt is A and O x 3 and cooperative and pleasant throughout             Pertinent Vitals/Pain Pain Assessment: No/denies pain           PT Goals (current goals can now be found in the care plan section) Acute Rehab PT Goals Patient Stated Goal: to go home Progress towards PT goals: Progressing toward goals    Frequency    Min 2X/week      PT Plan Current plan remains appropriate       AM-PAC PT "6 Clicks" Mobility   Outcome Measure  Help needed turning from your back to your  side while in a flat bed without using bedrails?: None Help needed moving from lying on your back to sitting on the side of a flat bed without using bedrails?: None Help needed moving to and from a bed to a chair (including a wheelchair)?: None Help needed standing up from a chair using your arms (e.g., wheelchair or bedside chair)?: None Help needed to walk in hospital room?: None Help needed climbing 3-5 steps with a railing? : A Little 6 Click Score: 23    End of Session Equipment Utilized During Treatment: Gait belt Activity Tolerance: Patient tolerated treatment well Patient left: in  bed;with call bell/phone within reach;with family/visitor present;with bed alarm set Nurse Communication: Mobility status PT Visit Diagnosis: Muscle weakness (generalized) (M62.81);Difficulty in walking, not elsewhere classified (R26.2);Other abnormalities of gait and mobility (R26.89);Unsteadiness on feet (R26.81)     Time: 9791-5041 PT Time Calculation (min) (ACUTE ONLY): 13 min  Charges:  $Gait Training: 8-22 mins                     Julaine Fusi PTA 04/16/20, 4:07 PM

## 2020-04-17 LAB — GLUCOSE, CAPILLARY
Glucose-Capillary: 102 mg/dL — ABNORMAL HIGH (ref 70–99)
Glucose-Capillary: 158 mg/dL — ABNORMAL HIGH (ref 70–99)
Glucose-Capillary: 171 mg/dL — ABNORMAL HIGH (ref 70–99)
Glucose-Capillary: 154 mg/dL — ABNORMAL HIGH (ref 70–99)

## 2020-04-17 LAB — BASIC METABOLIC PANEL
Anion gap: 10 (ref 5–15)
BUN: 28 mg/dL — ABNORMAL HIGH (ref 8–23)
CO2: 28 mmol/L (ref 22–32)
Calcium: 10 mg/dL (ref 8.9–10.3)
Chloride: 101 mmol/L (ref 98–111)
Creatinine, Ser: 1.1 mg/dL — ABNORMAL HIGH (ref 0.44–1.00)
GFR, Estimated: 44 mL/min — ABNORMAL LOW (ref >60)
Glucose, Bld: 110 mg/dL — ABNORMAL HIGH (ref 70–99)
Potassium: 4.2 mmol/L (ref 3.5–5.1)
Sodium: 139 mmol/L (ref 135–145)

## 2020-04-17 LAB — CBC
HCT: 36.1 % (ref 36.0–46.0)
Hemoglobin: 12 g/dL (ref 12.0–15.0)
MCH: 32.5 pg (ref 26.0–34.0)
MCHC: 33.2 g/dL (ref 30.0–36.0)
MCV: 97.8 fL (ref 80.0–100.0)
Platelets: 264 10*3/uL (ref 150–400)
RBC: 3.69 MIL/uL — ABNORMAL LOW (ref 3.87–5.11)
RDW: 12.5 % (ref 11.5–15.5)
WBC: 8.1 10*3/uL (ref 4.0–10.5)
nRBC: 0 % (ref 0.0–0.2)

## 2020-04-17 LAB — BRAIN NATRIURETIC PEPTIDE: B Natriuretic Peptide: 59.9 pg/mL (ref 0.0–100.0)

## 2020-04-17 MED ORDER — ATORVASTATIN CALCIUM 10 MG PO TABS: 10.0000 mg | ORAL_TABLET | Freq: Every day | ORAL | 0 refills | Status: DC

## 2020-04-17 MED ORDER — ENOXAPARIN SODIUM 40 MG/0.4ML ~~LOC~~ SOLN
40.0000 mg | SUBCUTANEOUS | Status: DC
  Administered 2020-04-17: 40 mg via SUBCUTANEOUS
  Filled 2020-04-17: qty 0.4

## 2020-04-17 MED ORDER — ATORVASTATIN CALCIUM 10 MG PO TABS
10.0000 mg | ORAL_TABLET | Freq: Every day | ORAL | Status: DC
  Administered 2020-04-17: 10 mg via ORAL
  Filled 2020-04-17: qty 1

## 2020-04-17 NOTE — Progress Notes (Signed)
Anticoagulation monitoring(Lovenox):  86 yo  female ordered Lovenox 30 mg Q24h  Filed Weights   04/10/20 1720  Weight: 73.6 kg (162 lb 3.2 oz)   Body mass index is 28.73 kg/m.    Lab Results  Component Value Date   CREATININE 1.10 (H) 04/17/2020   CREATININE 1.17 (H) 04/15/2020   CREATININE 0.97 04/13/2020   Estimated Creatinine Clearance: 31.4 mL/min (A) (by C-G formula based on SCr of 1.1 mg/dL (H)). Hemoglobin & Hematocrit     Component Value Date/Time   HGB 12.0 04/17/2020 0958   HGB 13.2 03/02/2013 1346   HCT 36.1 04/17/2020 0958   HCT 38.7 03/02/2013 1346     Per Protocol for Patient with estCrcl > 30 ml/min and BMI < 40, will transition to Lovenox 40 mg Q24h.

## 2020-04-17 NOTE — Progress Notes (Addendum)
Discharge Summary  Erica Donovan ENI:[id number] DOB: 1926-02-05  PCP: Juluis Pitch, MD  Admit date: 04/10/2020 Discharge date: 04/17/2020  Time spent: 35 minutes   Recommendations for Outpatient Follow-up:  1. Follow-up with cardiology in 1 to 2 weeks. 2. Follow-up with your primary care provider in 1 to 2 weeks. 3. Continue PT OT with assistance and fall precautions 4. Take your medications as prescribed  Discharge Diagnoses:  Active Hospital Problems   Diagnosis Date Noted  . Acute encephalopathy 04/11/2020  . AMS (altered mental status) 04/10/2020    Resolved Hospital Problems  No resolved problems to display.    Discharge Condition: Stable  Diet recommendation: Resume previous diet.  Vitals:   04/16/20 2328 04/17/20 0748  BP: (!) 124/59 126/61  Pulse: 67 60  Resp: 17 16  Temp: 98.6 F (37 C) 98 F (36.7 C)  SpO2: 95% 94%    History of present illness:  86 year old lady with prior history of hypertension hyperlipidemia, COPD and type 2 diabetes presents with altered mental status, confusion and generalized weakness along with fever Tmax 100.4. Labs were significant for AKI and urine was abnormal showing trace leukocytes and rare bacteria. She was admitted for further evaluation of acute metabolic encephalopathy. Seen by neurology, thought her altered mental status was secondary to delirium in the setting of dementia.  Started was added on Seroquel 12.5 mg BID with improvement of her mood.  04/17/20:   Seen and examined with her daughter at bedside.  All questions answered to the best of my ability.  She had no acute events overnight.  She is comfortable laying in bed.  No new complaints, no new concerns raised by her daughter.  Awaiting bed placement, TOC assisting with SNF placement  Hospital Course:  Active Problems:   AMS (altered mental status)   Acute encephalopathy  Resolved acute metabolic encephalopathy likely secondary to delirium in the  setting of dementia. Work-up has been unrevealing MRI negative for acute CVA, urine culture multiple species present, ammonia level normal.  Per her daughter she has reported neck pain and headache for which she was taking Tylenol.  History of varicella, no hx of zoster, had her immunization.  Seen by neurology, no need for further work up.  Likely related to delirum.  Continue delirium precautions. Started Seroquel on 04/13/20 with improvement of her mood. Appears to be back to her baseline mentation.  Recently treated urinary tract infection outpatient Per her daughter, she was treated with oral antibiotics days prior to presentation to the ED Urine culture had shown multiple species present, likely suppressed by recent antibiotic therapy with Bactrim.  Paroxysmal A. fib with resolved RVR Seen by cardiology Continue amiodarone 200 mg twice daily until cardiology follow-up outpatient. Rate controlled on p.o. Lopressor 12.5 mg twice daily Follow-up with cardiology  Hyperlipidemia LDL 136 on 04/11/2020 Started on Lipitor 10 mg daily per cardiology Follow-up with cardiology or your PCP  Physical debility/ambulatory dysfunction PT assessment recommended SNF Continue PT OT with assistance and fall precautions. TOC assisting with SNF placement.  Chronic anxiety Continue home Lexapro  Dementia with resolved behavioral disturbance while on Seroquel Continue Seroquel 12.5 mg twice daily  Chronic diastolic CHF Last 2D echo done on 04/11/2020 showed LVEF 55 to 60% with grade 1 diastolic dysfunction with mild mitral stenosis. Euvolemic on exam Follow-up with cardiology  Essential hypertension BP is at goal. Continue amiodarone and p.o. Lopressor.  Moderate protein calorie malnutrition BMI 28, albumin 3.1 Continue oral supplement Continue to [employer]  oral intake    Code Status: DNR  Family Communication: Daughter updated at  bedside   Consultants:  Neurology  Cardiology  Palliative care medicine  Procedures:  None  Antimicrobials:  None   Discharge Exam:  BP 126/61 (BP Location: Right Arm)   Pulse 60   Temp 98 F (36.7 C) (Oral)   Resp 16   Ht 5\' 3"  (1.6 m)   Wt 73.6 kg   SpO2 94%   BMI 28.73 kg/m  . General: 86 y.o. year-old female well-developed well-nourished no acute distress.  Pleasant.   . Cardiovascular: Regular rate and rhythm no rubs or gallops . Respiratory: Clear to auscultation no wheezes or rales.   . Abdomen: Soft nontender normal bowel sounds present.   . Musculoskeletal: No lower extremity edema bilaterally. Marland Kitchen Psychiatry: Mood is appropriate for condition and setting.  Discharge Instructions You were cared for by a hospitalist during your hospital stay. If you have any questions about your discharge medications or the care you received while you were in the hospital after you are discharged, you can call the unit and asked to speak with the hospitalist on call if the hospitalist that took care of you is not available. Once you are discharged, your primary care physician will handle any further medical issues. Please note that NO REFILLS for any discharge medications will be authorized once you are discharged, as it is imperative that you return to your primary care physician (or establish a relationship with a primary care physician if you do not have one) for your aftercare needs so that they can reassess your need for medications and monitor your lab values.   Allergies as of 04/17/2020      Reactions   Penicillins Nausea Only   Did it involve swelling of the face/tongue/throat, SOB, or low BP? Unknown Did it involve sudden or severe rash/hives, skin peeling, or any reaction on the inside of your mouth or nose? Unknown Did you need to seek medical attention at a hospital or doctor's office? Unknown When did it last happen?Many years ago. If all above answers are  "NO", may proceed with cephalosporin use.   Ciprofloxacin Nausea Only   Nitrofurantoin Itching, Rash      Medication List    STOP taking these medications   bismuth subsalicylate 235 TI/14ER suspension Commonly known as: PEPTO BISMOL   carboxymethylcellulose 0.5 % Soln Commonly known as: REFRESH PLUS   cetirizine 10 MG tablet Commonly known as: ZYRTEC   HYDROcodone-acetaminophen 5-325 MG tablet Commonly known as: Norco   LORazepam 0.5 MG tablet Commonly known as: ATIVAN   meloxicam 7.5 MG tablet Commonly known as: MOBIC   sulfamethoxazole-trimethoprim 800-160 MG tablet Commonly known as: BACTRIM DS   vitamin C 500 MG tablet Commonly known as: ASCORBIC ACID     TAKE these medications   acetaminophen 500 MG tablet Commonly known as: TYLENOL Take 500-1,000 mg by mouth every 6 (six) hours as needed for mild pain.   amiodarone 200 MG tablet Commonly known as: PACERONE Take 1 tablet (200 mg total) by mouth 2 (two) times daily.   aspirin 81 MG EC tablet Take 1 tablet (81 mg total) by mouth daily. Swallow whole. What changed: additional instructions   atorvastatin 10 MG tablet Commonly known as: LIPITOR Take 1 tablet (10 mg total) by mouth daily.   diphenhydrAMINE 25 MG tablet Commonly known as: BENADRYL Take 25 mg by mouth every 8 (eight) hours as needed for itching.   docusate sodium  100 MG capsule Commonly known as: COLACE Take 200 mg by mouth daily. And prn   escitalopram 20 MG tablet Commonly known as: LEXAPRO Take 20 mg by mouth every morning.   EX-LAX PO Take by mouth as needed.   feeding supplement Liqd Take 237 mLs by mouth 2 (two) times daily between meals.   ferrous sulfate 325 (65 FE) MG tablet Take 1 tablet (325 mg total) by mouth 2 (two) times daily with a meal.   Fish Oil 1000 MG Caps Take 1,000 mg by mouth 3 (three) times daily.   loratadine 10 MG tablet Commonly known as: CLARITIN Take 10 mg by mouth every morning.   metoprolol  tartrate 25 MG tablet Commonly known as: LOPRESSOR Take 0.5 tablets (12.5 mg total) by mouth 2 (two) times daily.   montelukast 10 MG tablet Commonly known as: SINGULAIR Take 1 tablet (10 mg total) by mouth at bedtime.   multivitamin with minerals Tabs tablet Take 1 tablet by mouth daily.   omeprazole 40 MG capsule Commonly known as: PRILOSEC Take 40 mg by mouth 2 (two) times a day.   QUEtiapine 25 MG tablet Commonly known as: SEROQUEL Take 0.5 tablets (12.5 mg total) by mouth at bedtime.      Allergies  Allergen Reactions  . Penicillins Nausea Only    Did it involve swelling of the face/tongue/throat, SOB, or low BP? Unknown Did it involve sudden or severe rash/hives, skin peeling, or any reaction on the inside of your mouth or nose? Unknown Did you need to seek medical attention at a hospital or doctor's office? Unknown When did it last happen?Many years ago. If all above answers are "NO", may proceed with cephalosporin use.   . Ciprofloxacin Nausea Only  . Nitrofurantoin Itching and Rash    Contact information for follow-up providers    Gladstone Pih, NP. Call in 1 day(s).   Why: Please call for a post hospital follow-up appointment. Contact information: [place] [address] [phone]        Juluis Pitch, MD. Call in 1 day(s).   Specialty: Family Medicine Why: Please call for a post hospital follow-up appointment. Contact information: [address] [place] [address] [phone]            Contact information for after-discharge care    Destination    [facility] .   Service: Skilled Nursing Contact information: [address] [place] [phone]                   The results of significant diagnostics from this hospitalization (including imaging, microbiology, ancillary and laboratory) are listed below for reference.    Significant Diagnostic  Studies: DG Chest 1 View  Result Date: 04/12/2020 CLINICAL DATA:  Tachycardia EXAM: CHEST  1 VIEW COMPARISON:  04/10/2020 FINDINGS: The heart size and mediastinal contours are within normal limits. Both lungs are clear. The visualized skeletal structures are unremarkable. IMPRESSION: No active disease. Electronically Signed   By: Ulyses Jarred M.D.   On: 04/12/2020 00:39   CT Head Wo Contrast  Result Date: 04/10/2020 CLINICAL DATA:  86 year old female with altered mental status. EXAM: CT HEAD WITHOUT CONTRAST TECHNIQUE: Contiguous axial images were obtained from the base of the skull through the vertex without intravenous contrast. COMPARISON:  Head CT dated 11/27/2019. FINDINGS: Brain: Moderate age-related atrophy and chronic microvascular ischemic changes. There is no acute intracranial hemorrhage. No mass effect or midline shift. No  extra-axial fluid collection. Vascular: No hyperdense vessel or unexpected calcification. Skull: Normal. Negative for fracture or focal lesion. Sinuses/Orbits: No acute finding. Other: None IMPRESSION: 1. No acute intracranial pathology. 2. Moderate age-related atrophy and chronic microvascular ischemic changes. Electronically Signed   By: Anner Crete M.D.   On: 04/10/2020 21:44   CT ANGIO CHEST PE W OR WO CONTRAST  Result Date: 04/12/2020 CLINICAL DATA:  Tachycardia and fever with elevated D-dimer EXAM: CT ANGIOGRAPHY CHEST WITH CONTRAST TECHNIQUE: Multidetector CT imaging of the chest was performed using the standard protocol during bolus administration of intravenous contrast. Multiplanar CT image reconstructions and MIPs were obtained to evaluate the vascular anatomy. CONTRAST:  18mL OMNIPAQUE IOHEXOL 350 MG/ML SOLN COMPARISON:  None. FINDINGS: Cardiovascular: Contrast injection is sufficient to demonstrate satisfactory opacification of the pulmonary arteries to the segmental level. There is no pulmonary embolus or evidence of right heart strain. The size of the  main pulmonary artery is normal. Heart size is normal, with no pericardial effusion. The course and caliber of the aorta are normal. There is mild atherosclerotic calcification. Opacification decreased due to pulmonary arterial phase contrast bolus timing. Mediastinum/Nodes: No mediastinal, hilar or axillary lymphadenopathy. Normal visualized thyroid. Thoracic esophageal course is normal. Lungs/Pleura: Emphysema.  No pleural effusion. Upper Abdomen: Contrast bolus timing is not optimized for evaluation of the abdominal organs. The visualized portions of the organs of the upper abdomen are normal. Musculoskeletal: No chest wall abnormality. No bony spinal canal stenosis. Review of the MIP images confirms the above findings. IMPRESSION: 1. No pulmonary embolus or other acute thoracic abnormality. Aortic Atherosclerosis (ICD10-I70.0) and Emphysema (ICD10-J43.9). Electronically Signed   By: Ulyses Jarred M.D.   On: 04/12/2020 03:56   MR BRAIN WO CONTRAST  Result Date: 04/11/2020 CLINICAL DATA:  Initial evaluation for acute altered mental status, confusion. EXAM: MRI HEAD WITHOUT CONTRAST TECHNIQUE: Multiplanar, multiecho pulse sequences of the brain and surrounding structures were obtained without intravenous contrast. COMPARISON:  Prior head CT from 04/10/2020. FINDINGS: Brain: Examination mildly degraded by motion artifact. Generalized age-related cerebral atrophy. Patchy and confluent T2/FLAIR hyperintensity within the periventricular deep white matter both cerebral hemispheres most consistent with chronic small vessel ischemic disease, moderate in nature. No abnormal foci of restricted diffusion to suggest acute or subacute ischemia. Gray-white matter differentiation maintained. No encephalomalacia to suggest chronic cortical infarction. No foci of susceptibility artifact to suggest acute or chronic intracranial hemorrhage. No mass lesion, midline shift or mass effect. No hydrocephalus or extra-axial fluid  collection. Pituitary gland suprasellar region normal. Midline structures intact. Vascular: Major intracranial vascular flow voids are maintained. Skull and upper cervical spine: Craniocervical junction within normal limits. Bone marrow signal intensity normal. No scalp soft tissue abnormality. Sinuses/Orbits: Patient status post bilateral ocular lens replacement. Globes and orbital soft tissues demonstrate no acute finding. Mild scattered mucosal thickening noted within the ethmoidal air cells and right maxillary sinus. Paranasal sinuses are otherwise clear. No mastoid effusion. Inner ear structures grossly normal. Other: None. IMPRESSION: 1. No acute intracranial abnormality. 2. Age-related cerebral atrophy with moderate chronic microvascular ischemic disease. Electronically Signed   By: Jeannine Boga M.D.   On: 04/11/2020 01:44   US Carotid Bilateral (at [facility] and [facility] only)  Result Date: 04/11/2020 CLINICAL DATA:  86 year old female with confusion. EXAM: BILATERAL CAROTID DUPLEX ULTRASOUND TECHNIQUE: Pearline Cables scale imaging, color Doppler and duplex ultrasound were performed of bilateral carotid and vertebral arteries in the neck. COMPARISON:  03/03/2019 FINDINGS: Criteria: Quantification of carotid stenosis is based on velocity parameters  that correlate the residual internal carotid diameter with NASCET-based stenosis levels, using the diameter of the distal internal carotid lumen as the denominator for stenosis measurement. The following velocity measurements were obtained: RIGHT ICA: Peak systolic velocity 76 cm/sec, End diastolic velocity 17 cm/sec CCA: Peak systolic velocity 63 cm/sec SYSTOLIC ICA/CCA RATIO:  1.2 ECA: Peak systolic velocity 269 cm/sec LEFT ICA: Peak systolic velocity 89 cm/sec, End diastolic velocity 30 cm/sec CCA: 62 cm/sec SYSTOLIC ICA/CCA RATIO:  1.4 ECA: 92 cm/sec RIGHT CAROTID ARTERY: Minimal atherosclerotic plaque formation at the carotid bifurcation. No significant tortuosity.  Normal low resistance waveforms. RIGHT VERTEBRAL ARTERY:  Antegrade flow. LEFT CAROTID ARTERY: Minimal atherosclerotic plaque formation at the carotid bifurcation. No significant tortuosity. Normal low resistance waveforms. LEFT VERTEBRAL ARTERY:  Antegrade flow. Upper extremity non-invasive blood pressures: Not obtained IMPRESSION: 1. Right carotid artery system: Less than 50% stenosis secondary to minimal atherosclerotic plaque formation. 2. Left carotid artery system: Less than 50% stenosis secondary to minimal atherosclerotic plaque formation. 3.  Vertebral artery system: Patent with antegrade flow bilaterally. Ruthann Cancer, MD [facility] Electronically Signed   By: Ruthann Cancer MD   On: 04/11/2020 07:30   US Venous Img Lower Bilateral (DVT)  Result Date: 04/12/2020 CLINICAL DATA:  Bilateral leg pain.  Elevated D-dimer. EXAM: BILATERAL LOWER EXTREMITY VENOUS DOPPLER ULTRASOUND TECHNIQUE: Gray-scale sonography with compression, as well as color and duplex ultrasound, were performed to evaluate the deep venous system(s) from the level of the common femoral vein through the popliteal and proximal calf veins. COMPARISON:  None. FINDINGS: BILATERAL VENOUS Normal compressibility of the common femoral, superficial femoral, and popliteal veins, as well as the visualized calf veins. Visualized portions of profunda femoral vein and great saphenous vein unremarkable. No filling defects to suggest DVT on grayscale or color Doppler imaging. Doppler waveforms show normal direction of venous flow, normal respiratory plasticity and response to augmentation. OTHER Complex fluid collection in the right popliteal fossa measuring 3.6 x 1.1 x 2.0 cm, possibly representing a Bakers cyst. Limitations: none IMPRESSION: 1.  Negative for DVT in the bilateral lower extremities. 2. Complex collection in the right popliteal fossa measuring 3.6 cm, possibly a Bakers cyst.  Electronically Signed   By: Audie Pinto M.D.   On: 04/12/2020 11:41   DG Chest Portable 1 View  Result Date: 04/10/2020 CLINICAL DATA:  86 year old female with fever. EXAM: PORTABLE CHEST 1 VIEW COMPARISON:  Chest radiograph dated 03/02/2019. FINDINGS: No focal consolidation, pleural effusion, or pneumothorax. Stable cardiac silhouette. Coronary vascular calcification and atherosclerotic calcification of the aortic arch. No acute osseous pathology. IMPRESSION: No active disease. Electronically Signed   By: Anner Crete M.D.   On: 04/10/2020 18:23   ECHOCARDIOGRAM COMPLETE  Result Date: 04/12/2020    ECHOCARDIOGRAM REPORT   Patient Name:   IDALEE FOXWORTHY [facility] Date of Exam: 04/11/2020 Medical Rec #:  [id number]       Height:       63.0 in Accession #:    [id number]      Weight:       162.2 lb Date of Birth:  [birth year]       BSA:          1.769 m Patient Age:    62 years        BP:           137/68 mmHg Patient Gender: F               HR:  85 bpm. Exam Location:  [facility] Procedure: 2D Echo, Cardiac Doppler and Color Doppler Indications:     TIA 435.9 / G45.9  History:         Patient has prior history of Echocardiogram examinations. Risk                  Factors:Hypertension.  Sonographer:     Alyse Low Roar Referring Phys:  [id number] Arvella Merles MANSY Diagnosing Phys: Yolonda Kida MD IMPRESSIONS  1. Left ventricular ejection fraction, by estimation, is 55 to 60%. The left ventricle has normal function. The left ventricle has no regional wall motion abnormalities. Left ventricular diastolic parameters are consistent with Grade I diastolic dysfunction (impaired relaxation).  2. Right ventricular systolic function is normal. The right ventricular size is normal.  3. The mitral valve is abnormal. Trivial mitral valve regurgitation. Mild mitral stenosis.  4. The aortic valve is normal in structure. Aortic valve regurgitation is not visualized. FINDINGS  Left Ventricle: Left ventricular ejection fraction,  by estimation, is 55 to 60%. The left ventricle has normal function. The left ventricle has no regional wall motion abnormalities. The left ventricular internal cavity size was normal in size. There is  borderline left ventricular hypertrophy. Left ventricular diastolic parameters are consistent with Grade I diastolic dysfunction (impaired relaxation). Right Ventricle: The right ventricular size is normal. No increase in right ventricular wall thickness. Right ventricular systolic function is normal. Left Atrium: Left atrial size was normal in size. Right Atrium: Right atrial size was normal in size. Pericardium: There is no evidence of pericardial effusion. Mitral Valve: The mitral valve is abnormal. There is mild thickening of the mitral valve leaflet(s). There is mild calcification of the mitral valve leaflet(s). Mild to moderate mitral annular calcification. Trivial mitral valve regurgitation. Mild mitral valve stenosis. Tricuspid Valve: The tricuspid valve is grossly normal. Tricuspid valve regurgitation is trivial. Aortic Valve: The aortic valve is normal in structure. Aortic valve regurgitation is not visualized. Aortic valve peak gradient measures 12.5 mmHg. Pulmonic Valve: The pulmonic valve was normal in structure. Pulmonic valve regurgitation is not visualized. Aorta: Aortic root could not be assessed. IAS/Shunts: The interatrial septum was not well visualized.  LEFT VENTRICLE PLAX 2D LVIDd:         3.82 cm  Diastology LVIDs:         2.64 cm  LV e' medial:    7.83 cm/s LV PW:         1.22 cm  LV E/e' medial:  15.8 LV IVS:        1.42 cm  LV e' lateral:   6.09 cm/s LVOT diam:     1.70 cm  LV E/e' lateral: 20.4 LVOT Area:     2.27 cm  RIGHT VENTRICLE RV Mid diam:    3.14 cm RV S prime:     12.80 cm/s TAPSE (M-mode): 2.2 cm LEFT ATRIUM             Index       RIGHT ATRIUM           Index LA diam:        3.30 cm 1.87 cm/m  RA Area:     12.20 cm LA Vol (A2C):   58.1 ml 32.84 ml/m RA Volume:   24.20 ml   13.68 ml/m LA Vol (A4C):   36.4 ml 20.58 ml/m LA Biplane Vol: 46.9 ml 26.51 ml/m  AORTIC VALVE  PULMONIC VALVE AV Area (Vmax): 1.69 cm    PV Vmax:        1.00 m/s AV Vmax:        177.00 cm/s PV Peak grad:   4.0 mmHg AV Peak Grad:   12.5 mmHg   RVOT Peak grad: 3 mmHg LVOT Vmax:      132.00 cm/s  AORTA Ao Root diam: 2.80 cm MITRAL VALVE                TRICUSPID VALVE MV Area (PHT): 4.17 cm     TR Peak grad:   38.9 mmHg MV Decel Time: 182 msec     TR Vmax:        312.00 cm/s MV E velocity: 124.00 cm/s MV A velocity: 98.50 cm/s   SHUNTS MV E/A ratio:  1.26         Systemic Diam: 1.70 cm MV A Prime:    10.7 cm/s Yolonda Kida MD Electronically signed by Yolonda Kida MD Signature Date/Time: 04/12/2020/1:20:48 PM    Final     Microbiology: Recent Results (from the past 240 hour(s))  Blood culture (routine x 2)     Status: None   Collection Time: 04/10/20  5:55 PM   Specimen: BLOOD  Result Value Ref Range Status   Specimen Description BLOOD RIGHT [facility]  Final   Special Requests   Final    BOTTLES DRAWN AEROBIC AND ANAEROBIC Blood Culture adequate volume   Culture   Final    NO GROWTH 5 DAYS Performed at [facility], [address] [place], [place] [address]    Report Status 04/15/2020 FINAL  Final  Blood culture (routine x 2)     Status: None   Collection Time: 04/10/20  5:55 PM   Specimen: BLOOD  Result Value Ref Range Status   Specimen Description BLOOD LEFT [facility]  Final   Special Requests   Final    BOTTLES DRAWN AEROBIC AND ANAEROBIC Blood Culture adequate volume   Culture   Final    NO GROWTH 5 DAYS Performed at [facility], [address] [place], [place] [address]    Report Status 04/15/2020 FINAL  Final  Respiratory Panel by RT PCR (Flu A&B, Covid) - Nasopharyngeal Swab     Status: None   Collection Time: 04/10/20  5:55 PM   Specimen: Nasopharyngeal Swab  Result Value Ref Range Status   SARS Coronavirus 2 by RT PCR NEGATIVE NEGATIVE Final     Comment: (NOTE) SARS-CoV-2 target nucleic acids are NOT DETECTED.  The SARS-CoV-2 RNA is generally detectable in upper respiratoy specimens during the acute phase of infection. The lowest concentration of SARS-CoV-2 viral copies this assay can detect is 131 copies/mL. A negative result does not preclude SARS-Cov-2 infection and should not be used as the sole basis for treatment or other patient management decisions. A negative result may occur with  improper specimen collection/handling, submission of specimen other than nasopharyngeal swab, presence of viral mutation(s) within the areas targeted by this assay, and inadequate number of viral copies (<131 copies/mL). A negative result must be combined with clinical observations, patient history, and epidemiological information. The expected result is Negative.  Fact Sheet for Patients:  PinkCheek.be  Fact Sheet for Healthcare Providers:  GravelBags.it  This test is no t yet approved or cleared by the [place] FDA and  has been authorized for detection and/or diagnosis of SARS-CoV-2 by FDA under an Emergency Use Authorization (EUA). This EUA will  remain  in effect (meaning this test can be used) for the duration of the COVID-19 declaration under Section 564(b)(1) of the Act, 21 U.S.C. section 360bbb-3(b)(1), unless the authorization is terminated or revoked sooner.     Influenza A by PCR NEGATIVE NEGATIVE Final   Influenza B by PCR NEGATIVE NEGATIVE Final    Comment: (NOTE) The Xpert Xpress SARS-CoV-2/FLU/RSV assay is intended as an aid in  the diagnosis of influenza from Nasopharyngeal swab specimens and  should not be used as a sole basis for treatment. Nasal washings and  aspirates are unacceptable for Xpert Xpress SARS-CoV-2/FLU/RSV  testing.  Fact Sheet for Patients: PinkCheek.be  Fact Sheet for Healthcare  Providers: GravelBags.it  This test is not yet approved or cleared by the [place] FDA and  has been authorized for detection and/or diagnosis of SARS-CoV-2 by  FDA under an Emergency Use Authorization (EUA). This EUA will remain  in effect (meaning this test can be used) for the duration of the  Covid-19 declaration under Section 564(b)(1) of the Act, 21  U.S.C. section 360bbb-3(b)(1), unless the authorization is  terminated or revoked. Performed at [facility], [address] [place], [place] [address]   Urine Culture     Status: Abnormal   Collection Time: 04/10/20  5:55 PM   Specimen: Urine, Random  Result Value Ref Range Status   Specimen Description   Final    URINE, RANDOM Performed at [facility], [address] [place], [place] [address]    Special Requests   Final    NONE Performed at [facility], [address] [place], [place] [address]    Culture MULTIPLE SPECIES PRESENT, SUGGEST RECOLLECTION (A)  Final   Report Status 04/11/2020 FINAL  Final  SARS Coronavirus 2 by RT PCR (hospital order, performed in [facility] hospital lab) Nasopharyngeal Nasopharyngeal Swab     Status: None   Collection Time: 04/14/20  3:51 PM   Specimen: Nasopharyngeal Swab  Result Value Ref Range Status   SARS Coronavirus 2 NEGATIVE NEGATIVE Final    Comment: (NOTE) SARS-CoV-2 target nucleic acids are NOT DETECTED.  The SARS-CoV-2 RNA is generally detectable in upper and lower respiratory specimens during the acute phase of infection. The lowest concentration of SARS-CoV-2 viral copies this assay can detect is 250 copies / mL. A negative result does not preclude SARS-CoV-2 infection and should not be used as the sole basis for treatment or other patient management decisions.  A negative result may occur with improper specimen collection / handling, submission of specimen other than nasopharyngeal swab, presence of viral  mutation(s) within the areas targeted by this assay, and inadequate number of viral copies (<250 copies / mL). A negative result must be combined with clinical observations, patient history, and epidemiological information.  Fact Sheet for Patients:   StrictlyIdeas.no  Fact Sheet for Healthcare Providers: BankingDealers.co.za  This test is not yet approved or  cleared by the [place] FDA and has been authorized for detection and/or diagnosis of SARS-CoV-2 by FDA under an Emergency Use Authorization (EUA).  This EUA will remain in effect (meaning this test can be used) for the duration of the COVID-19 declaration under Section 564(b)(1) of the Act, 21 U.S.C. section 360bbb-3(b)(1), unless the authorization is terminated or revoked sooner.  Performed at [facility], [address] [place], [place] [address]      Labs: Basic Metabolic Panel: Recent Labs  Lab 04/10/20 1755 04/10/20 1755 04/11/20 0429 04/12/20 0056 04/13/20  1136 04/15/20 0428 04/17/20 0958  NA 136  --  137 136 137  --  139  K 4.3  --  4.0 4.0 4.2  --  4.2  CL 98  --  106 103 103  --  101  CO2 26  --  23 24 26   --  28  GLUCOSE 114*  --  149* 109* 134*  --  110*  BUN 24*  --  22 19 20   --  28*  CREATININE 1.25*   < > 1.27* 1.18* 0.97 1.17* 1.10*  CALCIUM 10.0  --  8.8* 9.1 9.7  --  10.0  MG  --   --   --  1.7  --   --   --    < > = values in this interval not displayed.   Liver Function Tests: Recent Labs  Lab 04/10/20 1755 04/13/20 1136  AST 21 29  ALT 14 26  ALKPHOS 58 62  BILITOT 0.7 0.5  PROT 7.0 5.8*  ALBUMIN 4.0 3.1*   No results for input(s): LIPASE, AMYLASE in the last 168 hours. Recent Labs  Lab 04/13/20 1136  AMMONIA 28   CBC: Recent Labs  Lab 04/10/20 1755 04/11/20 0429 04/12/20 1144 04/13/20 1136 04/17/20 0958  WBC 7.3 4.9 5.6 5.1 8.1  NEUTROABS 5.9  --   --  2.7  --   HGB 12.4 9.9* 11.1* 11.2* 12.0  HCT 37.7  30.3* 33.0* 33.3* 36.1  MCV 99.0 99.7 98.5 98.2 97.8  PLT 227 179 189 188 264   Cardiac Enzymes: No results for input(s): CKTOTAL, CKMB, CKMBINDEX, TROPONINI in the last 168 hours. BNP: BNP (last 3 results) Recent Labs    04/12/20 0056 04/17/20 0958  BNP 326.6* 59.9    ProBNP (last 3 results) No results for input(s): PROBNP in the last 8760 hours.  CBG: Recent Labs  Lab 04/16/20 1147 04/16/20 1638 04/16/20 2044 04/17/20 0749 04/17/20 1142  GLUCAP 138* 124* 160* 102* 158*       Signed:  Kayleen Memos, MD [facility] 04/17/2020, 2:29 PM

## 2020-04-18 LAB — GLUCOSE, CAPILLARY
Glucose-Capillary: 118 mg/dL — ABNORMAL HIGH (ref 70–99)
Glucose-Capillary: 152 mg/dL — ABNORMAL HIGH (ref 70–99)

## 2020-04-18 NOTE — TOC Progression Note (Addendum)
Transition of Care [facility]) - Progression Note    Patient Details  Name: Erica Donovan MRN: [id number] Date of Birth: 02-05-1926  Transition [facility] CM/SW [place], LCSW Phone Number: 04/18/2020, 10:20 AM  Clinical Narrative:   [place] to follow up on what is needed for PASRR to be complete. She would like to do a phone interview with patient. Scheduled for 1:00 today. She also asked if MD thinks Dementia or Anxiety is primary, CSW asked MD.   11:20- Faxed Dementia primary note to [facility].   1:30- Met with patient and daughter at bedside with Pamala Hurry on the phone to complete PASRR screening. Pamala Hurry reported she will complete the PASRR in a few minutes.  Called Dorian Pod at [facility] with update. Dorian Pod reported they are able to accept patient today as soon as the Rosalie Gums is in. Room 1108 on [facility] CSW updated RN and will call EMS once PASRR is complete and RN is ready for patient to discharge. EMS paperwork and Face Sheet placed in discharge packet with DNR.  3:34- [place] to ask for update on when PASRR will be done. Left a voicemail   4:15- [place] again, she said she will have the PASRR done within the hour but that the facility should go ahead and take patient while she is working on it. Informed Dorian Pod at [facility] that per Pamala Hurry the [facility] will be in within an hour from 4:15 pm, scheduled [facility] transport for 5:30 pm. Updated RN and daughter.     Expected Discharge Plan: [place] Barriers to Discharge: Continued Medical Work up  Expected Discharge Plan and Services Expected Discharge Plan: [place] arrangements for the past 2 months: Single Family Home Expected Discharge Date: 04/18/20                                     Social Determinants of Health (SDOH) Interventions    Readmission Risk Interventions No flowsheet  data found.

## 2020-04-18 NOTE — Discharge Summary (Signed)
Discharge Summary  Erica Donovan MVE:[id number] DOB: Jan 15, 1926  PCP: Juluis Pitch, MD  Admit date: 04/10/2020 Discharge date: 04/18/2020  Time spent: 35 minutes   Recommendations for Outpatient Follow-up:  1. Follow-up with cardiology in 1 to 2 weeks. 2. Follow-up with your primary care provider in 1 to 2 weeks. 3. Continue PT OT with assistance and fall precautions 4. Take your medications as prescribed  Discharge Diagnoses:  Active Hospital Problems   Diagnosis Date Noted  . Acute encephalopathy 04/11/2020  . AMS (altered mental status) 04/10/2020    Resolved Hospital Problems  No resolved problems to display.    Discharge Condition: Stable  Diet recommendation: Resume previous diet.  Vitals:   04/17/20 2325 04/18/20 0738  BP: (!) 106/48 (!) 131/57  Pulse: 64 73  Resp: 17 18  Temp: 98.5 F (36.9 C) 98 F (36.7 C)  SpO2: 97% 96%    History of present illness:  86 year old lady with prior history of hypertension, hyperlipidemia, COPD, and type 2 diabetes presents with altered mental status, confusion and generalized weakness along with fever Tmax 100.4. Labs were significant for AKI and urine was abnormal showing trace leukocytes and rare bacteria. She was admitted for further evaluation of acute metabolic encephalopathy. Seen by neurology, thought her altered mental status was secondary to delirium in the setting of dementia.  Started on Seroquel 12.5 mg BID with improvement of her mood.  04/18/20:   Seen and examined with her daughter at bedside.  All questions answered to the best of my ability.  She had no acute events overnight.  She is comfortable laying in bed.  Vital signs and labs reviewed and are stable.  Patient is medically stable for discharge to SNF.  Hospital Course:  Active Problems:   AMS (altered mental status)   Acute encephalopathy  Resolved acute metabolic encephalopathy likely secondary to delirium in the setting of  dementia. Work-up has been unrevealing MRI negative for acute CVA, urine culture multiple species present, ammonia level normal.  Per her daughter she has reported neck pain and headache for which she was taking Tylenol.  History of varicella, no hx of zoster, had her immunization.  Seen by neurology, no need for further work up.  Likely related to delirum.  Continue delirium precautions. Continue Seroquel nightly, started on 04/13/20 with improvement of her mood. Appears to be back to her baseline mentation.  Recently treated urinary tract infection outpatient Per her daughter, she was treated with oral antibiotics days prior to presentation to the ED Urine culture had shown multiple species present, likely suppressed by recent antibiotic therapy with Bactrim.  Paroxysmal A. fib with resolved RVR Seen by cardiology Continue amiodarone 200 mg twice daily until cardiology follow-up outpatient. Rate controlled on p.o. Lopressor 12.5 mg twice daily Follow-up with cardiology outpatient.  Hyperlipidemia LDL 136 on 04/11/2020 Started on Lipitor 10 mg daily per cardiology Follow-up with cardiology or your PCP  Physical debility/ambulatory dysfunction PT assessment recommended SNF Continue PT OT with assistance and fall precautions. TOC assisting with SNF placement.  Chronic anxiety Continue home Lexapro  Dementia with resolved behavioral disturbance while on Seroquel Continue Seroquel 12.5 mg twice daily  Chronic diastolic CHF Mildly elevated BNP on presentation, 326 She is currently euvolemic Seen by cardiology Last 2D echo done on 04/11/2020 showed LVEF 55 to 60% with grade 1 diastolic dysfunction with mild mitral stenosis. Follow-up with cardiology outpatient.  Essential hypertension BP is at goal. Continue amiodarone 200 mg twice daily and p.o. Lopressor  12.5 mg twice daily. Follow-up with cardiology and your PCP.  Moderate protein calorie malnutrition BMI 28, albumin  3.1 Continue oral supplement Continue to Encourage oral intake    Code Status: DNR  Family Communication: Daughter updated at bedside.  All questions answered to the best of my ability.   Consultants:  Neurology  Cardiology  Palliative care medicine  Procedures:  None  Antimicrobials:  None   Discharge Exam:  BP (!) 131/57 (BP Location: Right Arm)   Pulse 73   Temp 98 F (36.7 C) (Oral)   Resp 18   Ht 5\' 3"  (1.6 m)   Wt 73.6 kg   SpO2 96%   BMI 28.73 kg/m  . General: 86 y.o. year-old female pleasant well-developed well-nourished no distress.  Alert and pleasantly confused.   . Cardiovascular: Regular rate and rhythm no rubs or gallops.  No JVD or thyromegaly. Marland Kitchen Respiratory: Clear to auscultation no wheeze or rales. . Abdomen: Soft nontender normal bowel sounds present. . Musculoskeletal: No lower extremity edema bilaterally.   Marland Kitchen Psychiatry: Mood is appropriate for condition and setting  Discharge Instructions You were cared for by a hospitalist during your hospital stay. If you have any questions about your discharge medications or the care you received while you were in the hospital after you are discharged, you can call the unit and asked to speak with the hospitalist on call if the hospitalist that took care of you is not available. Once you are discharged, your primary care physician will handle any further medical issues. Please note that NO REFILLS for any discharge medications will be authorized once you are discharged, as it is imperative that you return to your primary care physician (or establish a relationship with a primary care physician if you do not have one) for your aftercare needs so that they can reassess your need for medications and monitor your lab values.   Allergies as of 04/18/2020      Reactions   Penicillins Nausea Only   Did it involve swelling of the face/tongue/throat, SOB, or low BP? Unknown Did it involve sudden or severe  rash/hives, skin peeling, or any reaction on the inside of your mouth or nose? Unknown Did you need to seek medical attention at a hospital or doctor's office? Unknown When did it last happen?Many years ago. If all above answers are "NO", may proceed with cephalosporin use.   Ciprofloxacin Nausea Only   Nitrofurantoin Itching, Rash      Medication List    STOP taking these medications   bismuth subsalicylate 998 PJ/82NK suspension Commonly known as: PEPTO BISMOL   carboxymethylcellulose 0.5 % Soln Commonly known as: REFRESH PLUS   cetirizine 10 MG tablet Commonly known as: ZYRTEC   HYDROcodone-acetaminophen 5-325 MG tablet Commonly known as: Norco   LORazepam 0.5 MG tablet Commonly known as: ATIVAN   meloxicam 7.5 MG tablet Commonly known as: MOBIC   sulfamethoxazole-trimethoprim 800-160 MG tablet Commonly known as: BACTRIM DS   vitamin C 500 MG tablet Commonly known as: ASCORBIC ACID     TAKE these medications   acetaminophen 500 MG tablet Commonly known as: TYLENOL Take 500-1,000 mg by mouth every 6 (six) hours as needed for mild pain.   amiodarone 200 MG tablet Commonly known as: PACERONE Take 1 tablet (200 mg total) by mouth 2 (two) times daily.   aspirin 81 MG EC tablet Take 1 tablet (81 mg total) by mouth daily. Swallow whole. What changed: additional instructions   atorvastatin 10  MG tablet Commonly known as: LIPITOR Take 1 tablet (10 mg total) by mouth daily.   diphenhydrAMINE 25 MG tablet Commonly known as: BENADRYL Take 25 mg by mouth every 8 (eight) hours as needed for itching.   docusate sodium 100 MG capsule Commonly known as: COLACE Take 200 mg by mouth daily. And prn   escitalopram 20 MG tablet Commonly known as: LEXAPRO Take 20 mg by mouth every morning.   EX-LAX PO Take by mouth as needed.   feeding supplement Liqd Take 237 mLs by mouth 2 (two) times daily between meals.   ferrous sulfate 325 (65 FE) MG tablet Take 1 tablet  (325 mg total) by mouth 2 (two) times daily with a meal.   Fish Oil 1000 MG Caps Take 1,000 mg by mouth 3 (three) times daily.   loratadine 10 MG tablet Commonly known as: CLARITIN Take 10 mg by mouth every morning.   metoprolol tartrate 25 MG tablet Commonly known as: LOPRESSOR Take 0.5 tablets (12.5 mg total) by mouth 2 (two) times daily.   montelukast 10 MG tablet Commonly known as: SINGULAIR Take 1 tablet (10 mg total) by mouth at bedtime.   multivitamin with minerals Tabs tablet Take 1 tablet by mouth daily.   omeprazole 40 MG capsule Commonly known as: PRILOSEC Take 40 mg by mouth 2 (two) times a day.   QUEtiapine 25 MG tablet Commonly known as: SEROQUEL Take 0.5 tablets (12.5 mg total) by mouth at bedtime.      Allergies  Allergen Reactions  . Penicillins Nausea Only    Did it involve swelling of the face/tongue/throat, SOB, or low BP? Unknown Did it involve sudden or severe rash/hives, skin peeling, or any reaction on the inside of your mouth or nose? Unknown Did you need to seek medical attention at a hospital or doctor's office? Unknown When did it last happen?Many years ago. If all above answers are "NO", may proceed with cephalosporin use.   . Ciprofloxacin Nausea Only  . Nitrofurantoin Itching and Rash    Contact information for follow-up providers    Gladstone Pih, NP. Call in 1 day(s).   Why: Please call for a post hospital follow-up appointment. Contact information: [place] [address] [phone]        Juluis Pitch, MD. Call in 1 day(s).   Specialty: Family Medicine Why: Please call for a post hospital follow-up appointment. Contact information: [address] [place] [address] [phone]            Contact information for after-discharge care    Destination    [facility] .   Service: Skilled Nursing Contact information: [address] [place] [phone]                   The results of significant diagnostics from this hospitalization (including imaging, microbiology, ancillary and laboratory) are listed below for reference.    Significant Diagnostic Studies: DG Chest 1 View  Result Date: 04/12/2020 CLINICAL DATA:  Tachycardia EXAM: CHEST  1 VIEW COMPARISON:  04/10/2020 FINDINGS: The heart size and mediastinal contours are within normal limits. Both lungs are clear. The visualized skeletal structures are unremarkable. IMPRESSION: No active disease. Electronically Signed   By: Ulyses Jarred M.D.   On: 04/12/2020 00:39   CT Head Wo Contrast  Result Date: 04/10/2020 CLINICAL DATA:  86 year old female with altered mental status. EXAM: CT HEAD WITHOUT CONTRAST TECHNIQUE: Contiguous axial images were  obtained from the base of the skull through the vertex without intravenous contrast. COMPARISON:  Head CT dated 11/27/2019. FINDINGS: Brain: Moderate age-related atrophy and chronic microvascular ischemic changes. There is no acute intracranial hemorrhage. No mass effect or midline shift. No extra-axial fluid collection. Vascular: No hyperdense vessel or unexpected calcification. Skull: Normal. Negative for fracture or focal lesion. Sinuses/Orbits: No acute finding. Other: None IMPRESSION: 1. No acute intracranial pathology. 2. Moderate age-related atrophy and chronic microvascular ischemic changes. Electronically Signed   By: Anner Crete M.D.   On: 04/10/2020 21:44   CT ANGIO CHEST PE W OR WO CONTRAST  Result Date: 04/12/2020 CLINICAL DATA:  Tachycardia and fever with elevated D-dimer EXAM: CT ANGIOGRAPHY CHEST WITH CONTRAST TECHNIQUE: Multidetector CT imaging of the chest was performed using the standard protocol during bolus administration of intravenous contrast. Multiplanar CT image reconstructions and MIPs were obtained to evaluate the vascular anatomy. CONTRAST:  38mL OMNIPAQUE IOHEXOL 350 MG/ML SOLN COMPARISON:   None. FINDINGS: Cardiovascular: Contrast injection is sufficient to demonstrate satisfactory opacification of the pulmonary arteries to the segmental level. There is no pulmonary embolus or evidence of right heart strain. The size of the main pulmonary artery is normal. Heart size is normal, with no pericardial effusion. The course and caliber of the aorta are normal. There is mild atherosclerotic calcification. Opacification decreased due to pulmonary arterial phase contrast bolus timing. Mediastinum/Nodes: No mediastinal, hilar or axillary lymphadenopathy. Normal visualized thyroid. Thoracic esophageal course is normal. Lungs/Pleura: Emphysema.  No pleural effusion. Upper Abdomen: Contrast bolus timing is not optimized for evaluation of the abdominal organs. The visualized portions of the organs of the upper abdomen are normal. Musculoskeletal: No chest wall abnormality. No bony spinal canal stenosis. Review of the MIP images confirms the above findings. IMPRESSION: 1. No pulmonary embolus or other acute thoracic abnormality. Aortic Atherosclerosis (ICD10-I70.0) and Emphysema (ICD10-J43.9). Electronically Signed   By: Ulyses Jarred M.D.   On: 04/12/2020 03:56   MR BRAIN WO CONTRAST  Result Date: 04/11/2020 CLINICAL DATA:  Initial evaluation for acute altered mental status, confusion. EXAM: MRI HEAD WITHOUT CONTRAST TECHNIQUE: Multiplanar, multiecho pulse sequences of the brain and surrounding structures were obtained without intravenous contrast. COMPARISON:  Prior head CT from 04/10/2020. FINDINGS: Brain: Examination mildly degraded by motion artifact. Generalized age-related cerebral atrophy. Patchy and confluent T2/FLAIR hyperintensity within the periventricular deep white matter both cerebral hemispheres most consistent with chronic small vessel ischemic disease, moderate in nature. No abnormal foci of restricted diffusion to suggest acute or subacute ischemia. Gray-white matter differentiation  maintained. No encephalomalacia to suggest chronic cortical infarction. No foci of susceptibility artifact to suggest acute or chronic intracranial hemorrhage. No mass lesion, midline shift or mass effect. No hydrocephalus or extra-axial fluid collection. Pituitary gland suprasellar region normal. Midline structures intact. Vascular: Major intracranial vascular flow voids are maintained. Skull and upper cervical spine: Craniocervical junction within normal limits. Bone marrow signal intensity normal. No scalp soft tissue abnormality. Sinuses/Orbits: Patient status post bilateral ocular lens replacement. Globes and orbital soft tissues demonstrate no acute finding. Mild scattered mucosal thickening noted within the ethmoidal air cells and right maxillary sinus. Paranasal sinuses are otherwise clear. No mastoid effusion. Inner ear structures grossly normal. Other: None. IMPRESSION: 1. No acute intracranial abnormality. 2. Age-related cerebral atrophy with moderate chronic microvascular ischemic disease. Electronically Signed   By: Jeannine Boga M.D.   On: 04/11/2020 01:44   US Carotid Bilateral (at [facility] and [facility] only)  Result Date: 04/11/2020 CLINICAL DATA:  86 year old  female with confusion. EXAM: BILATERAL CAROTID DUPLEX ULTRASOUND TECHNIQUE: Pearline Cables scale imaging, color Doppler and duplex ultrasound were performed of bilateral carotid and vertebral arteries in the neck. COMPARISON:  03/03/2019 FINDINGS: Criteria: Quantification of carotid stenosis is based on velocity parameters that correlate the residual internal carotid diameter with NASCET-based stenosis levels, using the diameter of the distal internal carotid lumen as the denominator for stenosis measurement. The following velocity measurements were obtained: RIGHT ICA: Peak systolic velocity 76 cm/sec, End diastolic velocity 17 cm/sec CCA: Peak systolic velocity 63 cm/sec SYSTOLIC ICA/CCA RATIO:  1.2 ECA: Peak systolic velocity 921 cm/sec LEFT ICA:  Peak systolic velocity 89 cm/sec, End diastolic velocity 30 cm/sec CCA: 62 cm/sec SYSTOLIC ICA/CCA RATIO:  1.4 ECA: 92 cm/sec RIGHT CAROTID ARTERY: Minimal atherosclerotic plaque formation at the carotid bifurcation. No significant tortuosity. Normal low resistance waveforms. RIGHT VERTEBRAL ARTERY:  Antegrade flow. LEFT CAROTID ARTERY: Minimal atherosclerotic plaque formation at the carotid bifurcation. No significant tortuosity. Normal low resistance waveforms. LEFT VERTEBRAL ARTERY:  Antegrade flow. Upper extremity non-invasive blood pressures: Not obtained IMPRESSION: 1. Right carotid artery system: Less than 50% stenosis secondary to minimal atherosclerotic plaque formation. 2. Left carotid artery system: Less than 50% stenosis secondary to minimal atherosclerotic plaque formation. 3.  Vertebral artery system: Patent with antegrade flow bilaterally. Ruthann Cancer, MD [facility] Electronically Signed   By: Ruthann Cancer MD   On: 04/11/2020 07:30   US Venous Img Lower Bilateral (DVT)  Result Date: 04/12/2020 CLINICAL DATA:  Bilateral leg pain.  Elevated D-dimer. EXAM: BILATERAL LOWER EXTREMITY VENOUS DOPPLER ULTRASOUND TECHNIQUE: Gray-scale sonography with compression, as well as color and duplex ultrasound, were performed to evaluate the deep venous system(s) from the level of the common femoral vein through the popliteal and proximal calf veins. COMPARISON:  None. FINDINGS: BILATERAL VENOUS Normal compressibility of the common femoral, superficial femoral, and popliteal veins, as well as the visualized calf veins. Visualized portions of profunda femoral vein and great saphenous vein unremarkable. No filling defects to suggest DVT on grayscale or color Doppler imaging. Doppler waveforms show normal direction of venous flow, normal respiratory plasticity and response to augmentation. OTHER Complex fluid collection in the right popliteal fossa  measuring 3.6 x 1.1 x 2.0 cm, possibly representing a Bakers cyst. Limitations: none IMPRESSION: 1.  Negative for DVT in the bilateral lower extremities. 2. Complex collection in the right popliteal fossa measuring 3.6 cm, possibly a Bakers cyst. Electronically Signed   By: Audie Pinto M.D.   On: 04/12/2020 11:41   DG Chest Portable 1 View  Result Date: 04/10/2020 CLINICAL DATA:  86 year old female with fever. EXAM: PORTABLE CHEST 1 VIEW COMPARISON:  Chest radiograph dated 03/02/2019. FINDINGS: No focal consolidation, pleural effusion, or pneumothorax. Stable cardiac silhouette. Coronary vascular calcification and atherosclerotic calcification of the aortic arch. No acute osseous pathology. IMPRESSION: No active disease. Electronically Signed   By: Anner Crete M.D.   On: 04/10/2020 18:23   ECHOCARDIOGRAM COMPLETE  Result Date: 04/12/2020    ECHOCARDIOGRAM REPORT   Patient Name:   ALYZABETH PONTILLO [facility] Date of Exam: 04/11/2020 Medical Rec #:  [id number]       Height:       63.0 in Accession #:    [id number]      Weight:       162.2 lb Date of Birth:  05/13/1926       BSA:          1.769 m Patient Age:  86 years        BP:           137/68 mmHg Patient Gender: F               HR:           85 bpm. Exam Location:  [facility] Procedure: 2D Echo, Cardiac Doppler and Color Doppler Indications:     TIA 435.9 / G45.9  History:         Patient has prior history of Echocardiogram examinations. Risk                  Factors:Hypertension.  Sonographer:     Alyse Low Roar Referring Phys:  [id number] Arvella Merles MANSY Diagnosing Phys: Yolonda Kida MD IMPRESSIONS  1. Left ventricular ejection fraction, by estimation, is 55 to 60%. The left ventricle has normal function. The left ventricle has no regional wall motion abnormalities. Left ventricular diastolic parameters are consistent with Grade I diastolic dysfunction (impaired relaxation).  2. Right ventricular systolic function is normal. The right ventricular size is normal.   3. The mitral valve is abnormal. Trivial mitral valve regurgitation. Mild mitral stenosis.  4. The aortic valve is normal in structure. Aortic valve regurgitation is not visualized. FINDINGS  Left Ventricle: Left ventricular ejection fraction, by estimation, is 55 to 60%. The left ventricle has normal function. The left ventricle has no regional wall motion abnormalities. The left ventricular internal cavity size was normal in size. There is  borderline left ventricular hypertrophy. Left ventricular diastolic parameters are consistent with Grade I diastolic dysfunction (impaired relaxation). Right Ventricle: The right ventricular size is normal. No increase in right ventricular wall thickness. Right ventricular systolic function is normal. Left Atrium: Left atrial size was normal in size. Right Atrium: Right atrial size was normal in size. Pericardium: There is no evidence of pericardial effusion. Mitral Valve: The mitral valve is abnormal. There is mild thickening of the mitral valve leaflet(s). There is mild calcification of the mitral valve leaflet(s). Mild to moderate mitral annular calcification. Trivial mitral valve regurgitation. Mild mitral valve stenosis. Tricuspid Valve: The tricuspid valve is grossly normal. Tricuspid valve regurgitation is trivial. Aortic Valve: The aortic valve is normal in structure. Aortic valve regurgitation is not visualized. Aortic valve peak gradient measures 12.5 mmHg. Pulmonic Valve: The pulmonic valve was normal in structure. Pulmonic valve regurgitation is not visualized. Aorta: Aortic root could not be assessed. IAS/Shunts: The interatrial septum was not well visualized.  LEFT VENTRICLE PLAX 2D LVIDd:         3.82 cm  Diastology LVIDs:         2.64 cm  LV e' medial:    7.83 cm/s LV PW:         1.22 cm  LV E/e' medial:  15.8 LV IVS:        1.42 cm  LV e' lateral:   6.09 cm/s LVOT diam:     1.70 cm  LV E/e' lateral: 20.4 LVOT Area:     2.27 cm  RIGHT VENTRICLE RV Mid diam:     3.14 cm RV S prime:     12.80 cm/s TAPSE (M-mode): 2.2 cm LEFT ATRIUM             Index       RIGHT ATRIUM           Index LA diam:        3.30 cm 1.87 cm/m  RA Area:  12.20 cm LA Vol (A2C):   58.1 ml 32.84 ml/m RA Volume:   24.20 ml  13.68 ml/m LA Vol (A4C):   36.4 ml 20.58 ml/m LA Biplane Vol: 46.9 ml 26.51 ml/m  AORTIC VALVE                PULMONIC VALVE AV Area (Vmax): 1.69 cm    PV Vmax:        1.00 m/s AV Vmax:        177.00 cm/s PV Peak grad:   4.0 mmHg AV Peak Grad:   12.5 mmHg   RVOT Peak grad: 3 mmHg LVOT Vmax:      132.00 cm/s  AORTA Ao Root diam: 2.80 cm MITRAL VALVE                TRICUSPID VALVE MV Area (PHT): 4.17 cm     TR Peak grad:   38.9 mmHg MV Decel Time: 182 msec     TR Vmax:        312.00 cm/s MV E velocity: 124.00 cm/s MV A velocity: 98.50 cm/s   SHUNTS MV E/A ratio:  1.26         Systemic Diam: 1.70 cm MV A Prime:    10.7 cm/s Yolonda Kida MD Electronically signed by Yolonda Kida MD Signature Date/Time: 04/12/2020/1:20:48 PM    Final     Microbiology: Recent Results (from the past 240 hour(s))  Blood culture (routine x 2)     Status: None   Collection Time: 04/10/20  5:55 PM   Specimen: BLOOD  Result Value Ref Range Status   Specimen Description BLOOD RIGHT [facility]  Final   Special Requests   Final    BOTTLES DRAWN AEROBIC AND ANAEROBIC Blood Culture adequate volume   Culture   Final    NO GROWTH 5 DAYS Performed at [facility], [address] [place] [address]    Report Status 04/15/2020 FINAL  Final  Blood culture (routine x 2)     Status: None   Collection Time: 04/10/20  5:55 PM   Specimen: BLOOD  Result Value Ref Range Status   Specimen Description BLOOD LEFT [facility]  Final   Special Requests   Final    BOTTLES DRAWN AEROBIC AND ANAEROBIC Blood Culture adequate volume   Culture   Final    NO GROWTH 5 DAYS Performed at [facility], [address] [place],  [address]    Report Status 04/15/2020 FINAL  Final    Respiratory Panel by RT PCR (Flu A&B, Covid) - Nasopharyngeal Swab     Status: None   Collection Time: 04/10/20  5:55 PM   Specimen: Nasopharyngeal Swab  Result Value Ref Range Status   SARS Coronavirus 2 by RT PCR NEGATIVE NEGATIVE Final    Comment: (NOTE) SARS-CoV-2 target nucleic acids are NOT DETECTED.  The SARS-CoV-2 RNA is generally detectable in upper respiratoy specimens during the acute phase of infection. The lowest concentration of SARS-CoV-2 viral copies this assay can detect is 131 copies/mL. A negative result does not preclude SARS-Cov-2 infection and should not be used as the sole basis for treatment or other patient management decisions. A negative result may occur with  improper specimen collection/handling, submission of specimen other than nasopharyngeal swab, presence of viral mutation(s) within the areas targeted by this assay, and inadequate number of viral copies (<131 copies/mL). A negative result must be combined with clinical observations, patient history, and epidemiological information. The expected  result is Negative.  Fact Sheet for Patients:  PinkCheek.be  Fact Sheet for Healthcare Providers:  GravelBags.it  This test is no t yet approved or cleared by the [place] FDA and  has been authorized for detection and/or diagnosis of SARS-CoV-2 by FDA under an Emergency Use Authorization (EUA). This EUA will remain  in effect (meaning this test can be used) for the duration of the COVID-19 declaration under Section 564(b)(1) of the Act, 21 U.S.C. section 360bbb-3(b)(1), unless the authorization is terminated or revoked sooner.     Influenza A by PCR NEGATIVE NEGATIVE Final   Influenza B by PCR NEGATIVE NEGATIVE Final    Comment: (NOTE) The Xpert Xpress SARS-CoV-2/FLU/RSV assay is intended as an aid in  the diagnosis of influenza from Nasopharyngeal swab specimens and  should not be used as  a sole basis for treatment. Nasal washings and  aspirates are unacceptable for Xpert Xpress SARS-CoV-2/FLU/RSV  testing.  Fact Sheet for Patients: PinkCheek.be  Fact Sheet for Healthcare Providers: GravelBags.it  This test is not yet approved or cleared by the [place] FDA and  has been authorized for detection and/or diagnosis of SARS-CoV-2 by  FDA under an Emergency Use Authorization (EUA). This EUA will remain  in effect (meaning this test can be used) for the duration of the  Covid-19 declaration under Section 564(b)(1) of the Act, 21  U.S.C. section 360bbb-3(b)(1), unless the authorization is  terminated or revoked. Performed at [facility], [address] [place], [place] [address]   Urine Culture     Status: Abnormal   Collection Time: 04/10/20  5:55 PM   Specimen: Urine, Random  Result Value Ref Range Status   Specimen Description   Final    URINE, RANDOM Performed at [facility], [address] [place], [place] [address]    Special Requests   Final    NONE Performed at [facility], [address] [place], [place] [address]    Culture MULTIPLE SPECIES PRESENT, SUGGEST RECOLLECTION (A)  Final   Report Status 04/11/2020 FINAL  Final  SARS Coronavirus 2 by RT PCR (hospital order, performed in [facility] hospital lab) Nasopharyngeal Nasopharyngeal Swab     Status: None   Collection Time: 04/14/20  3:51 PM   Specimen: Nasopharyngeal Swab  Result Value Ref Range Status   SARS Coronavirus 2 NEGATIVE NEGATIVE Final    Comment: (NOTE) SARS-CoV-2 target nucleic acids are NOT DETECTED.  The SARS-CoV-2 RNA is generally detectable in upper and lower respiratory specimens during the acute phase of infection. The lowest concentration of SARS-CoV-2 viral copies this assay can detect is 250 copies / mL. A negative result does not preclude SARS-CoV-2 infection and should not be used  as the sole basis for treatment or other patient management decisions.  A negative result may occur with improper specimen collection / handling, submission of specimen other than nasopharyngeal swab, presence of viral mutation(s) within the areas targeted by this assay, and inadequate number of viral copies (<250 copies / mL). A negative result must be combined with clinical observations, patient history, and epidemiological information.  Fact Sheet for Patients:   StrictlyIdeas.no  Fact Sheet for Healthcare Providers: BankingDealers.co.za  This test is not yet approved or  cleared by the [place] FDA and has been authorized for detection and/or diagnosis of SARS-CoV-2 by FDA under an Emergency Use Authorization (EUA).  This EUA will remain in effect (meaning this test can be used) for the duration of the  COVID-19 declaration under Section 564(b)(1) of the Act, 21 U.S.C. section 360bbb-3(b)(1), unless the authorization is terminated or revoked sooner.  Performed at [facility], [address] [place], [place] [address]      Labs: Basic Metabolic Panel: Recent Labs  Lab 04/12/20 0056 04/13/20 1136 04/15/20 0428 04/17/20 0958  NA 136 137  --  139  K 4.0 4.2  --  4.2  CL 103 103  --  101  CO2 24 26  --  28  GLUCOSE 109* 134*  --  110*  BUN 19 20  --  28*  CREATININE 1.18* 0.97 1.17* 1.10*  CALCIUM 9.1 9.7  --  10.0  MG 1.7  --   --   --    Liver Function Tests: Recent Labs  Lab 04/13/20 1136  AST 29  ALT 26  ALKPHOS 62  BILITOT 0.5  PROT 5.8*  ALBUMIN 3.1*   No results for input(s): LIPASE, AMYLASE in the last 168 hours. Recent Labs  Lab 04/13/20 1136  AMMONIA 28   CBC: Recent Labs  Lab 04/12/20 1144 04/13/20 1136 04/17/20 0958  WBC 5.6 5.1 8.1  NEUTROABS  --  2.7  --   HGB 11.1* 11.2* 12.0  HCT 33.0* 33.3* 36.1  MCV 98.5 98.2 97.8  PLT 189 188 264   Cardiac Enzymes: No results for  input(s): CKTOTAL, CKMB, CKMBINDEX, TROPONINI in the last 168 hours. BNP: BNP (last 3 results) Recent Labs    04/12/20 0056 04/17/20 0958  BNP 326.6* 59.9    ProBNP (last 3 results) No results for input(s): PROBNP in the last 8760 hours.  CBG: Recent Labs  Lab 04/17/20 0749 04/17/20 1142 04/17/20 1640 04/17/20 2123 04/18/20 0739  GLUCAP 102* 158* 171* 154* 118*       Signed:  Kayleen Memos, MD [facility] 04/18/2020, 10:43 AM

## 2020-04-18 NOTE — Progress Notes (Signed)
Pt discharged to [facility].  IV removed without complication.  AVS placed in packet.  Report called to Clovis Pu, RN

## 2020-04-18 NOTE — Progress Notes (Addendum)
Patient Erica Donovan is diagnosed with Dementia and Anxiety.   Dementia is the primary diagnosis and supercedes all other mental health diagnoses.   DOB 2026-04-11  [place], [place]

## 2020-04-18 NOTE — Plan of Care (Signed)
No acute events overnight.  Problem: Clinical Measurements: Goal: Ability to maintain clinical measurements within normal limits will improve Outcome: Progressing

## 2020-04-18 NOTE — Care Management Important Message (Signed)
Important Message  Patient Details  Name: Erica Donovan MRN: [id number] Date of Birth: 1925-10-02   Medicare Important Message Given:  Yes     Dannette Barbara 04/18/2020, 11:34 AM

## 2020-04-18 NOTE — Progress Notes (Signed)
Occupational Therapy Treatment Patient Details Name: Erica Donovan MRN: [id number] DOB: Jun 23, 1926 Today's Date: 04/18/2020    History of present illness Pt is a 86 yo female that presented to the ED for AMS per family, weakness, fever. Did experience nausea and vomiting as well.   PMH includes COPD, depression, dementia, DM, GERD, HLD, HTN as well as recent R wrist fracture and ORIF.   OT comments  Erica Donovan continues to present with generalized weakness, limited endurance, and slight confusion, which impacts her ability to safely complete functional mobility tasks. However, she had made considerable improvement since admission, demonstrating increased activity tolerance each day. This patient is pleasant and displays a hearty sense of humor; she was agreeable to today's session focused on self care and therapeutic exercise. Erica Donovan was able to transition supine<sit with SUPV and maintained good sitting balance EOB, both with and without UE support. Pt had been using two hands on RW to come into standing; therapist provided education re: keeping at least one hand on sitting surface while coming to stand. Pt expressed understanding and was able to modify her standing technique appropriately. Pt participated in exercises in standing, first with UE support and then without hands on RW, with no LOB. Pt left sitting EOB with daughter in room. Pt is making good progress towards goals and will continue to benefit from skilled OT services. Discharge recommendations remain appropriate, and family anticipates transfer to SNF later today.    Follow Up Recommendations  SNF    Equipment Recommendations       Recommendations for Other Services      Precautions / Restrictions Precautions Precautions: Fall Restrictions Weight Bearing Restrictions: No       Mobility Bed Mobility Overal bed mobility: Needs Assistance Bed Mobility: Supine to Sit     Supine to sit: Supervision;HOB elevated      General bed mobility comments: Pt moved supine<>sit quickly and fluidly this PM  Transfers Overall transfer level: Needs assistance Equipment used: Rolling walker (2 wheeled) Transfers: Sit to/from Stand Sit to Stand: Supervision         General transfer comment: Provided educ re: hand placement, pt verbalizes understanding and demonstrates    Balance Overall balance assessment: Needs assistance Sitting-balance support: Feet supported;Bilateral upper extremity supported Sitting balance-Leahy Scale: Good     Standing balance support: Bilateral upper extremity supported;During functional activity Standing balance-Leahy Scale: Good                             ADL either performed or assessed with clinical judgement   ADL Overall ADL's : Needs assistance/impaired Eating/Feeding: Modified independent                                   Functional mobility during ADLs: Minimal assistance       Vision Baseline Vision/History: Wears glasses Wears Glasses: At all times Patient Visual Report: No change from baseline     Perception     Praxis      Cognition Arousal/Alertness: Awake/alert Behavior During Therapy: WFL for tasks assessed/performed Overall Cognitive Status: History of cognitive impairments - at baseline                                 General Comments: pt is A and O x 3 and cooperative  and pleasant throughout        Exercises Other Exercises Other Exercises: Therex in sitting and standing with RW, including marching, side stepping, punches, reaching outside BOS.   Shoulder Instructions       General Comments Bruising on BUE    Pertinent Vitals/ Pain          Home Living                                          Prior Functioning/Environment              Frequency  Min 1X/week        Progress Toward Goals  OT Goals(current goals can now be found in the care plan section)   Progress towards OT goals: Progressing toward goals  Acute Rehab OT Goals Patient Stated Goal: to go home OT Goal Formulation: With patient/family Time For Goal Achievement: 04/25/20 Potential to Achieve Goals: Good  Plan Discharge plan remains appropriate    Co-evaluation                 AM-PAC OT "6 Clicks" Daily Activity     Outcome Measure   Help from another person eating meals?: None Help from another person taking care of personal grooming?: A Little Help from another person toileting, which includes using toliet, bedpan, or urinal?: A Little Help from another person bathing (including washing, rinsing, drying)?: A Little Help from another person to put on and taking off regular upper body clothing?: A Little Help from another person to put on and taking off regular lower body clothing?: A Little 6 Click Score: 19    End of Session Equipment Utilized During Treatment: Rolling walker  OT Visit Diagnosis: Unsteadiness on feet (R26.81);Repeated falls (R29.6);Muscle weakness (generalized) (M62.81);Other symptoms and signs involving cognitive function   Activity Tolerance Patient tolerated treatment well   Patient Left with family/visitor present;with call bell/phone within reach;in chair   Nurse Communication          Time: 1517-6160 OT Time Calculation (min): 24 min  Charges: OT General Charges $OT Visit: 1 Visit OT Treatments $Self Care/Home Management : 8-22 mins $Therapeutic Exercise: 8-22 mins  Josiah Lobo, PhD, [place], OTR/L ascom [phone] 04/18/20, 3:58 PM

## 2020-04-20 ENCOUNTER — Ambulatory Visit (INDEPENDENT_AMBULATORY_CARE_PROVIDER_SITE_OTHER): Payer: Medicare Other | Admitting: Dermatology

## 2020-04-20 ENCOUNTER — Other Ambulatory Visit: Payer: Self-pay

## 2020-04-20 ENCOUNTER — Encounter: Payer: Self-pay | Admitting: Dermatology

## 2020-04-20 DIAGNOSIS — D692 Other nonthrombocytopenic purpura: Secondary | ICD-10-CM | POA: Diagnosis not present

## 2020-04-20 DIAGNOSIS — L814 Other melanin hyperpigmentation: Secondary | ICD-10-CM

## 2020-04-20 DIAGNOSIS — D229 Melanocytic nevi, unspecified: Secondary | ICD-10-CM

## 2020-04-20 DIAGNOSIS — Z1283 Encounter for screening for malignant neoplasm of skin: Secondary | ICD-10-CM

## 2020-04-20 DIAGNOSIS — R238 Other skin changes: Secondary | ICD-10-CM | POA: Diagnosis not present

## 2020-04-20 DIAGNOSIS — L578 Other skin changes due to chronic exposure to nonionizing radiation: Secondary | ICD-10-CM

## 2020-04-20 DIAGNOSIS — D489 Neoplasm of uncertain behavior, unspecified: Secondary | ICD-10-CM

## 2020-04-20 DIAGNOSIS — D1800 Hemangioma unspecified site: Secondary | ICD-10-CM

## 2020-04-20 DIAGNOSIS — L821 Other seborrheic keratosis: Secondary | ICD-10-CM

## 2020-04-20 NOTE — Patient Instructions (Signed)
Recommend daily broad spectrum sunscreen SPF 30+ to sun-exposed areas, reapply every 2 hours as needed. Call for new or changing lesions.  

## 2020-04-20 NOTE — Progress Notes (Signed)
° °  Follow-Up Visit   Subjective  Erica Donovan is a 86 y.o. female who presents for the following: TBSE (are of concern on Right side of face).  Patient here for full body skin exam and skin cancer screening with her daughter. Patient has area on right side of face she would like to have evaluated today. No h/o of skin cancer  The following portions of the chart were reviewed this encounter and updated as appropriate:  Tobacco   Allergies   Meds   Problems   Med Hx   Surg Hx   Fam Hx       Review of Systems:  No other skin or systemic complaints except as noted in HPI or Assessment and Plan.  Objective  Well appearing patient in no apparent distress; mood and affect are within normal limits.  A full examination was performed including scalp, head, eyes, ears, nose, lips, neck, chest, axillae, abdomen, back, buttocks, bilateral upper extremities, bilateral lower extremities, hands, feet, fingers, toes, fingernails, and toenails. All findings within normal limits unless otherwise noted below.  Objective  Right Wrist - Anterior: Violaceous macules and patches.   Objective  Right Upper Cutaneous Lip: Favor inflammatory papule  Objective  Left Hip (side) - Posterior: Erythema bilateral medial buttocks   Assessment & Plan  Senile purpura (HCC) Right Wrist - Anterior  Benign-appearing.  Observation.    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 - Observe - Call for any changes  Melanocytic Nevi - Tan-brown and/or pink-flesh-colored symmetric macules and papules - Benign appearing on exam today -  Observation - Call clinic for new or changing moles - Recommend daily use of broad spectrum spf 30+ sunscreen to sun-exposed areas.   Hemangiomas - Red papules - Discussed benign nature - Observe - Call for any changes  Actinic Damage - diffuse scaly erythematous macules with underlying dyspigmentation - Recommend daily broad spectrum sunscreen SPF 30+ to sun-exposed areas, reapply every 2 hours as needed.  - Call for new or changing lesions.  Skin cancer screening performed today.   Return if symptoms worsen or fail to improve.  IDonzetta Kohut, CMA, am acting as scribe for Forest Gleason, MD .  Documentation: I have reviewed the above documentation for accuracy and completeness, and I agree with the above.  Forest Gleason, MD

## 2020-04-27 ENCOUNTER — Encounter: Payer: Self-pay | Admitting: Dermatology

## 2020-05-04 DIAGNOSIS — I480 Paroxysmal atrial fibrillation: Secondary | ICD-10-CM | POA: Diagnosis present

## 2020-05-06 ENCOUNTER — Other Ambulatory Visit: Payer: Self-pay | Admitting: Internal Medicine

## 2020-05-06 ENCOUNTER — Ambulatory Visit: Payer: Medicare Other | Attending: Internal Medicine

## 2020-05-06 DIAGNOSIS — Z23 Encounter for immunization: Secondary | ICD-10-CM

## 2020-11-22 ENCOUNTER — Ambulatory Visit: Payer: Medicare Other | Attending: Internal Medicine

## 2020-11-22 ENCOUNTER — Other Ambulatory Visit: Payer: Self-pay

## 2020-11-22 DIAGNOSIS — Z23 Encounter for immunization: Secondary | ICD-10-CM

## 2020-11-22 MED ORDER — PFIZER-BIONT COVID-19 VAC-TRIS 30 MCG/0.3ML IM SUSP
INTRAMUSCULAR | 0 refills | Status: DC
  Filled 2020-11-22: qty 0.3, 1d supply, fill #0

## 2020-11-22 NOTE — Progress Notes (Signed)
   Covid-19 Vaccination Clinic  Name:  Erica Donovan    MRN: [id number] DOB: Feb 28, 1926  11/22/2020  Ms. Erica Donovan was observed post Covid-19 immunization for 15 minutes without incident. She was provided with Vaccine Information Sheet and instruction to access the V-Safe system.   Ms. Erica Donovan was instructed to call 911 with any severe reactions post vaccine: Marland Kitchen Difficulty breathing  . Swelling of face and throat  . A fast heartbeat  . A bad rash all over body  . Dizziness and weakness   Immunizations Administered    Name Date Dose VIS Date Route   PFIZER Comrnaty(Gray TOP) Covid-19 Vaccine 11/22/2020  2:02 PM 0.3 mL 06/09/2020 Intramuscular   Manufacturer: [employer]   Lot: [id number]   NDC: [place], PharmD, MBA Clinical Acute Care Pharmacist

## 2021-01-23 ENCOUNTER — Other Ambulatory Visit (HOSPITAL_COMMUNITY): Payer: Self-pay | Admitting: Neurology

## 2021-01-23 ENCOUNTER — Other Ambulatory Visit: Payer: Self-pay | Admitting: Neurology

## 2021-01-23 DIAGNOSIS — R42 Dizziness and giddiness: Secondary | ICD-10-CM

## 2021-01-31 ENCOUNTER — Ambulatory Visit: Payer: Medicare Other

## 2021-02-03 ENCOUNTER — Other Ambulatory Visit: Payer: Self-pay

## 2021-02-03 ENCOUNTER — Ambulatory Visit
Admission: RE | Admit: 2021-02-03 | Discharge: 2021-02-03 | Disposition: A | Discharge status: Home / Self Care | Payer: Medicare Other | Source: Ambulatory Visit | Attending: Neurology | Admitting: Neurology

## 2021-02-03 DIAGNOSIS — R42 Dizziness and giddiness: Secondary | ICD-10-CM | POA: Diagnosis present

## 2021-02-07 ENCOUNTER — Ambulatory Visit: Payer: Medicare Other

## 2021-02-08 ENCOUNTER — Ambulatory Visit: Payer: Medicare Other | Attending: Neurology

## 2021-02-08 ENCOUNTER — Other Ambulatory Visit: Payer: Self-pay

## 2021-02-08 DIAGNOSIS — M6281 Muscle weakness (generalized): Secondary | ICD-10-CM | POA: Insufficient documentation

## 2021-02-08 DIAGNOSIS — R262 Difficulty in walking, not elsewhere classified: Secondary | ICD-10-CM | POA: Diagnosis present

## 2021-02-08 DIAGNOSIS — R42 Dizziness and giddiness: Secondary | ICD-10-CM | POA: Insufficient documentation

## 2021-02-08 DIAGNOSIS — R2681 Unsteadiness on feet: Secondary | ICD-10-CM | POA: Diagnosis present

## 2021-02-08 DIAGNOSIS — R269 Unspecified abnormalities of gait and mobility: Secondary | ICD-10-CM | POA: Diagnosis present

## 2021-02-08 DIAGNOSIS — R2689 Other abnormalities of gait and mobility: Secondary | ICD-10-CM | POA: Diagnosis present

## 2021-02-08 NOTE — Therapy (Deleted)
[place] [facility] SERVICES [address] 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- eye center keeps check on this   GERD (gastroesophageal reflux disease)    Hemorrhoids    History of kidney stones    Hyperlipidemia    Hypertension    Leaky heart valve    Paroxysmal SVT (supraventricular tachycardia) ([place])     Past Surgical History:  Procedure Laterality Date   ABDOMINAL HYSTERECTOMY     APPENDECTOMY  1945   EYE SURGERY     cataract bil   HEMORRHOID SURGERY     JOINT REPLACEMENT Left 2008   ORIF WRIST FRACTURE Right 11/06/2018   Procedure: OPEN REDUCTION INTERNAL FIXATION (ORIF) WRIST FRACTURE RIGHT - DIABETIC, SLEEP APNEA;  Surgeon: Hessie Knows, MD;  Location: [facility] ORS;  Service: Orthopedics;  Laterality: Right;    There were no vitals filed for this visit.   VESTIBULAR AND BALANCE EVALUATION   HISTORY:  Subjective history of current problem: on and off for a few years, worst episode a month ago Description of dizziness: (vertigo, unsteadiness, lightheadedness, falling, general unsteadiness, whoozy, swimmy-headed sensation, aural fullness) Frequency:  Duration: Symptom nature: (motion provoked, positional, spontaneous, constant, variable, intermittent)   Provocative Factors: Easing Factors:  Progression of symptoms: (better, worse, no change since onset) History of similar episodes:  Falls (yes/no): Number of falls in past 6  months:  2  Prior Functional Level:   Auditory complaints (tinnitus, pain, drainage, hearing loss, aural fullness): Vision (diplopia, visual field loss, recent changes, last eye exam):  Red Flags: (dysarthria, dysphagia, drop attacks, bowel and bladder changes, recent weight loss/gain) Review of systems negative for red flags. Pt is seeing dr for difficulty swallowing foods. Pt has dementia, difficulty finding words. Pt reports no changes in b/b. No changes in weight.  FOTO 43 goal 50  EXAMINATION  POSTURE:   NEUROLOGICAL SCREEN: (2+ unless otherwise noted.) N=normal  Ab=abnormal  Level Dermatome R L Myotome R L Reflex R L  C3 Anterior Neck N N Sidebend C2-3 N N Jaw CN V    C4 Top of Shoulder N N Shoulder Shrug C4 N N Hoffman's UMN    C5 Lateral Upper Arm N N Shoulder ABD C4-5 N N Biceps C5-6    C6 Lateral Arm/ Thumb N N Arm Flex/ Wrist Ext C5-6 N N Brachiorad. C5-6    C7 Middle Finger N N Arm Ext//Wrist Flex C6-7 N N Triceps C7    C8 4th & 5th Finger N N Flex/ Ext Carpi Ulnaris C8 N N Patellar (L3-4)    T1 Medial Arm N N Interossei T1 N N Gastrocnemius    L2 Medial thigh/groin N N Illiopsoas (L2-3) N N     L3 Lower thigh/med.knee N N Quadriceps (L3-4) N N     L4 Medial leg/lat thigh N N Tibialis Ant (L4-5) N N     L5 Lat. leg & dorsal foot  N N EHL (L5) N N     S1 post/lat foot/thigh/leg N N Gastrocnemius (S1-2) N N     S2 Post./med. thigh & leg N N Hamstrings (L4-S3) N N       Cranial Nerves Visual acuity and visual fields are intact  Extraocular muscles are intact  Facial sensation is intact bilaterally  Facial strength is intact bilaterally  Hearing is normal as tested by gross conversation Palate elevates midline, normal phonation  Shoulder shrug strength is intact  Tongue protrudes midline    SOMATOSENSORY:         Sensation           Intact      Diminished         Absent  Light touch       COORDINATION: Finger to Nose: Normal Heel to Shin: Normal Pronator  Drift: Negative Rapid Alternating Movements: Normal Finger to Thumb Opposition: Normal  MUSCULOSKELETAL SCREEN: Cervical Spine ROM: WFL and painless in all planes. No gross deficits identified   ROM:   MMT:   Functional Mobility:  Gait: Scanning of visual environment with gait is:    POSTURAL CONTROL TESTS:   Clinical Test of Sensory Interaction for Balance    (CTSIB):  CONDITION TIME STRATEGY SWAY  Eyes open, firm surface 30 seconds ankle   Eyes closed, firm surface 30 seconds ankle   Eyes open, foam surface 30 seconds ankle   Eyes closed, foam surface 30 seconds ankle     OCULOMOTOR / VESTIBULAR TESTING:  Oculomotor Exam- Room Light  Findings Comments  Ocular Alignment {normal/abnormal/not examined:14677}   Ocular ROM {normal/abnormal/not examined:14677}   Spontaneous Nystagmus {normal/abnormal/not examined:14677}   Gaze-Holding Nystagmus {normal/abnormal/not examined:14677}   End-Gaze Nystagmus {normal/abnormal/not examined:14677}   Vergence (normal 2-3") normal   Smooth Pursuit abnormal Saccadic   Cross-Cover Test {normal/abnormal/not examined:14677}   Saccades abnormal Decreased speed  VOR Cancellation {normal/abnormal/not examined:14677}   Left Head Impulse abnormal   Right Head Impulse normal   Static Acuity {normal/abnormal/not examined:14677}   Dynamic Acuity {normal/abnormal/not examined:14677}     Oculomotor Exam- Fixation Suppressed  Findings Comments  Ocular Alignment {normal/abnormal/not examined:14677}   Spontaneous Nystagmus {normal/abnormal/not examined:14677}   Gaze-Holding Nystagmus {normal/abnormal/not examined:14677}   End-Gaze Nystagmus {normal/abnormal/not examined:14677}   Head Shaking Nystagmus {normal/abnormal/not examined:14677}   Pressure-Induced Nystagmus {normal/abnormal/not examined:14677}   Hyperventilation Induced Nystagmus {normal/abnormal/not examined:14677}   Skull Vibration Induced Nystagmus {normal/abnormal/not  examined:14677}     BPPV TESTS:  Symptoms Duration Intensity Nystagmus  L Dix-Hallpike Positive, pt very dizzy Around 1 minute Pt reports feeling really dizzy Yes, seconds  R Dix-Hallpike Minor diziness   None  L Head Roll None   None  R Head Roll None   None  L Sidelying Test Not tested     R Sidelying Test Not tested      Epley maneuver to treat L side with assist from another PT  FUNCTIONAL OUTCOME MEASURES   Results Comments  BERG /56 Fall risk, in need of intervention  DGI /24   FGA /30   TUG seconds   5TSTS seconds   6 Minute Walk Test    10 Meter Gait Speed Self-selected: s = m/s; Fastest: s = m/s Below normative values for full community ambulation  ABC Scale %   DHI /100     ASSESSMENT Clinical Impression: Pt is a pleasant year-old female/female referred for difficulty with baalance. PT examination reveals deficits . Pt presents with deficits in strength, gait and balance. Pt will  benefit from skilled PT services to address deficits in balance and decrease risk for future falls.   Low (stable): no personal factors/comorbidities, 1-2 body systems/activity limitations/participation restrictions   Moderate (evolving): 1-2 personal factors/comorbidities, 3 or more body systems/activity limitations/participation restrictions   High (unstable): 3 or more personal factors/comorbidities, 4 or more body systems/activity limitations/participation restrictions    PLAN Next Visit: HEP:    Pt will be independent with HEP in order to improve strength and balance in order to decrease fall risk and improve function at home and work.   Pt will improve BERG by at least 3 points in order to demonstrate clinically significant improvement in balance.    Pt will improve DGI by at least 3 points in order to demonstrate clinically significant improvement in balance and decreased risk for falls.  Pt will improve ABC by at least 13% in order to demonstrate clinically significant  improvement in balance confidence.   Pt will decrease 5TSTS by at least 3 seconds in order to demonstrate clinically significant improvement in LE strength.  Pt will decrease TUG to below 14 seconds/decrease in order to demonstrate decreased fall risk.  Pt will decrease DHI score by at least 18 points in order to demonstrate clinically significant reduction in disability   Pt will increase 6MWT by at least 72m(1651f in order to demonstrate clinically significant improvement in cardiopulmonary endurance and community ambulation                               Objective measurements completed on examination: See above findings.                             Patient will benefit from skilled therapeutic intervention in order to improve the following deficits and impairments:     Visit Diagnosis: No diagnosis found.     Problem List Patient Active Problem List   Diagnosis Date Noted   Acute encephalopathy 04/11/2020   AMS (altered mental status) 04/10/2020   Postural dizziness with near syncope 03/02/2019    HaZollie Pee/04/2021, 4:02 PM  [place] [facility] [address]: [phone] Fax:  [phone]: Erica ZALAZARRN: [id number] of Birth: 10/1925/12/29

## 2021-02-09 NOTE — Therapy (Signed)
[place] MAIN [facility] SERVICES [address] [place], [place], [address] Phone: [phone]   Fax:  [phone]  Physical Therapy Evaluation  Patient Details  Name: Erica Donovan MRN: [id number] Date of Birth: August 11, 1925 Referring Provider (PT): Anabel Bene, MD   Encounter Date: 02/08/2021   PT End of Session - 02/09/21 0825     Visit Number 1    Number of Visits 25    Date for PT Re-Evaluation 05/03/21    Authorization Type eval performed 02/08/2021    PT Start Time 1603    PT Stop Time 1700    PT Time Calculation (min) 57 min    Equipment Utilized During Treatment Gait belt    Activity Tolerance Patient tolerated treatment well    Behavior During Therapy WFL for tasks assessed/performed             Past Medical History:  Diagnosis Date   Arthritis    Cancer ([place])    skin cancer   COPD (chronic obstructive pulmonary disease) ([place])    mild- no inhalers   Dementia ([place])    Depression    Diabetes mellitus without complication ([place])    Eye abnormality    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)     Past Surgical History:  Procedure Laterality Date   ABDOMINAL HYSTERECTOMY     APPENDECTOMY  1945   EYE SURGERY     cataract bil   HEMORRHOID SURGERY     JOINT REPLACEMENT Left 2008   ORIF WRIST FRACTURE Right 11/06/2018   Procedure: OPEN REDUCTION INTERNAL FIXATION (ORIF) WRIST FRACTURE RIGHT - DIABETIC, SLEEP APNEA;  Surgeon: Hessie Knows, MD;  Location: [facility] ORS;  Service: Orthopedics;  Laterality: Right;    There were no vitals filed for this visit.    Subjective Assessment - 02/08/21 1606     Subjective Pt presents to PT evaluation with her daughter. Pt prefers to go by [name].  Pt has dementia and has some difficulty answering questions; her daughter answers most questions on her behalf. The pt has had dizzy spells on and off for a few years. Worst recent dizzy spell occurred about a month ago where pt had trouble getting out of bed. Pt also with fall d/t dizziness last may that resulted in a brain bleed. Pt has seen neurologist and currently they are unsure if dizziness is d/t crystals or if it is cardiac related. Pt does report she sometimes feels like she is going to pass out. She also reports the room sometimes looks as if it is spinning. She reports worst sx with initially standing up. She is sometimes dizzy when she is not moving. However, it is unclear if she is turning her head when this happens. Pt unsure of duration of sx. Her daughter reports that pt's dizziness has lasted at least an hour before. Pt unsure if she has been nauseated with her sx. Pt does have tinnitus. Pt sometimes has headaches, but report they are not migraines. Pt unsure of what makes her sx better. She does report she remains still until sx pass. Pt with hx of double vision, is being followed by a doctor d/t bleed behind L eye (still being monitored).  Pt has fallen 2x in the last six months. They have been trying exercises at home where pt lays down and turns her head, but this at times makes her sx worse.    Patient is accompained by: Family member    Pertinent History Pt presents to PT evaluation with her daughter. Pt prefers to go by [name]. Pt has dementia and has some difficulty answering questions; her daughter answers most questions on her behalf. The pt has had dizzy spells on and off for a few years. Worst recent dizzy spell occurred about a month ago where pt had trouble getting out of bed. Pt also with fall d/t dizziness last may that resulted in a brain bleed. Pt has seen neurologist and currently they are unsure if dizziness is d/t crystals or if it is cardiac related. Pt does report she  sometimes feels like she is going to pass out. She also reports the room sometimes looks as if it is spinning. She reports worst sx with initially standing up. She is sometimes dizzy when she is not moving. However, it is unclear if she is turning her head when this happens. Pt unsure of duration of sx. Her daughter reports that pt's dizziness has lasted at least an hour before. Pt unsure if she has been nauseated with her sx. Pt does have tinnitus. Pt sometimes has headaches, but report they are not migraines. Pt unsure of what makes her sx better. She does report she remains still until sx pass. Pt with hx of double vision, is being followed by a doctor d/t bleed behind L eye (still being monitored). Pt has fallen 2x in the last six months. They have been trying exercises at home where pt lays down and turns her head, but this at times makes her sx worse. Other PMH includes afib, HTN, T2DM, HLD, CKD, altered mental status, acute encephalopathy, postural dizziness with near syncope.    Limitations Standing;Walking;House hold activities;Lifting    How long can you sit comfortably? not limited    How long can you stand comfortably? Pt can stand for about 10 minutes    How long can you walk comfortably? can walk from chair to bathroom with her RW, prior to onset of dizziness could walk up and down her street    Diagnostic tests per chart:  MR brain WO contrast 8/5: No evidence of acute intracranial abnormality. Moderate chronic small vessel ischemic changes within the cerebral   white matter, stable as compared to the brain MRI of 04/11/2020.   Moderate generalized cerebral atrophy. Comparatively mild cerebellar   atrophy. These findings are also stable. Severe mucosal thickening within the right maxillary sinus, progressed.    Patient Stated Goals To make the dizziness go away    Currently in Pain? No/denies                [facility] PT Assessment - 02/08/21 1619       Assessment   Medical Diagnosis  dizziness and giddiness    Referring Provider (PT) Anabel Bene, MD    Onset Date/Surgical Date --   on and off possibly for a few years   Hand Dominance Right    Next MD Visit September 20th with neurology    Prior Therapy yes      Precautions   Precautions Fall      Restrictions   Weight Bearing Restrictions No      Balance Screen   Has the patient fallen in the past 6  months Yes    How many times? 2    Has the patient had a decrease in activity level because of a fear of falling?  Yes      [place] residence    Living Arrangements Alone    Available Help at Discharge Family    Type of [place] to enter    Entrance Stairs-Number of Steps [place] One level    [place] - 4 wheels;Cane - single point;Wheelchair - manual;Grab bars - toilet;Grab bars - tub/shower;Shower seat;Toilet riser;Hospital bed      Prior Function   Level of Independence Needs assistance with ADLs   daugther helps with meals,     Cognition   Overall Cognitive Status --   pt has dementia, worsening memory             VESTIBULAR AND BALANCE EVALUATION   HISTORY:  Subjective history of current problem: Main c/o of dizziness that has been occurring on and off for a few years. Secondary complaint of imbalance and deconditioning as a result of dizziness that has reduced pt's ability to ambulate and participate in ADLs.   Description of dizziness: (vertigo, unsteadiness, lightheadedness, falling, general unsteadiness, whoozy, swimmy-headed sensation, aural fullness): describes as vertigo, lightheadedness, unsteadiness Frequency: with movement, on and off  Duration: pt difficulty reporting, possibly up to an hour at a time Symptom nature: (motion provoked, positional, spontaneous, constant, variable, intermittent): pt with difficulty reporting, however, positional change does bring  on dizziness, but also reports has happened when sitting and "not doing anything" Provocative Factors: bending, positional changes, head movement, standing up Easing Factors: resting, stillness  Falls (yes/no): Number of falls in past 6 months:  2  Prior Functional Level: needed assistance with ADLs, however, daughter reports pt now requires more assistance d/t decreased mobility as a result of dizziness  Auditory complaints (tinnitus, pain, drainage, hearing loss, aural fullness): tinnitus Vision (diplopia, visual field loss, recent changes, last eye exam): Per daughter pt does have blood behind her L eye that is being monitored by her doctor   Red Flags: (dysarthria, dysphagia, drop attacks, bowel and bladder changes, recent weight loss/gain) Review of systems negative for red flags.  Pt is seeing dr for difficulty swallowing foods. Pt has dementia, difficulty finding words. Pt reports no changes in b/b. No changes in weight.   EXAMINATION  POSTURE: kyphotic, rounded shoulders, forward-head posture, overall in flexed position throughout thoracolumbar spine  NEUROLOGICAL SCREEN:  SOMATOSENSORY:         Sensation           Intact      Diminished         Absent  Light touch In BUEs and BLEs      COORDINATION: Finger to Nose: Normal Rapid Alternating Movements: Normal   MUSCULOSKELETAL SCREEN: Cervical Spine ROM: Formal assessment deferred. However, when performing vestibular testing pt observed to have impaired cervical ROM.  MMT: formal testing deferred. Pt observed to have decreased LE strength and require min-mod assist throughout session for transfers and bed mobility on plinth with vestibular testing.   Functional Mobility: impaired  Gait: Pt presents in transport chair. Per daughter pt uses a RW within her home, but has seen decline in ability to ambulate since onset of dizziness. Formal assessment deferred d/t time. Pt did require up to mod a with transfers and when  standing, must hold onto something d/t decreased balance      OCULOMOTOR / VESTIBULAR TESTING:  Oculomotor Exam- Room Light  Findings Comments  Ocular Alignment normal   Ocular ROM normal   Spontaneous Nystagmus not examined   Gaze-Holding Nystagmus not examined   End-Gaze Nystagmus not examined   Vergence (normal 2-3") normal   Smooth Pursuit abnormal Saccadic   Cross-Cover Test not examined   Saccades abnormal Decreased speed  VOR Cancellation not examined   Left Head Impulse abnormal Undershooting, corrective saccade  Right Head Impulse normal   Static Acuity not examined   Dynamic Acuity not examined      BPPV TESTS:  Symptoms Duration Intensity Nystagmus  L Dix-Hallpike Positive, pt dizzy, reports room moving Around 1 minute Pt reports feeling "really dizzy" Yes, seconds, appears torsional, upbeating, pt with difficulty keeping eyes open  R Dix-Hallpike Minor diziness   None  L Head Roll Not tested     R Head Roll Not tested     L Sidelying Test Not tested     R Sidelying Test Not tested      Epley maneuver to treat L side with assist from another PT  FUNCTIONAL OUTCOME MEASURES   Results Comments  ABC Scale % deferred  [place] 54/100 Pt's daughter primarily fills out form on daughter's behalf d/t pt having dementia   FOTO 43 goal 50  ASSESSMENT Clinical Impression: Pt is a pleasant 87 year-old female referred for dizziness with additional reports of deconditioning, reduced balance, ADL and gait ability secondary to dizziness. PT examination reveals abnormal saccades, smooth pursuits and L Head Impulse testing. DHI indicates moderate perception of handicap d/t dizziness (daughter fills out questionnaires for pt d/t pt having dementia). Pt also with positive L Dix-Hallpike with nystagmus present. PT treated pt with L side Epley maneuver 1x where pt reported some dizziness after maneuver completed. Further treatments with Epley may be necessary if dizziness not fully  resolved. Pt does also report lightheadedness with standing up, indicating dizziness may be multifactorial. Orthostatics to be tested future session as pt is able, to determine if BP contributing to sx. The pt will benefit from further skilled PT services to address the aforementioned deficits in order to decrease fall risk and improve pt's ability to safely ambulate and participate in ADLs.   PLAN Next Visit: orthostatics as pt able, HEP HEP: to be initiated       Objective measurements completed on examination: See above findings.               PT Education - 02/09/21 0824     Education Details Examination findings, purpose of Marye Round and Epley manuever, technique, POC    Person(s) Educated Patient;Child(ren)    Methods Explanation;Demonstration;Tactile cues;Verbal cues    Comprehension Verbalized understanding;Returned demonstration              PT Short Term Goals - 02/08/21 1628       PT SHORT TERM GOAL #1   Title Pt will be independent with HEP in order to improve strength and balance in order to decrease fall risk and improve function at home and work.    Baseline 8/10: to be initiated    Time 6    Period Weeks    Status New    Target Date 03/22/21      PT SHORT TERM GOAL #2   Title --      PT SHORT TERM GOAL #3   Title --  PT SHORT TERM GOAL #4   Title --      PT SHORT TERM GOAL #5   Title --               PT Long Term Goals - 02/09/21 0855       PT LONG TERM GOAL #1   Title Patient will increase FOTO score to equal to or greater than 50 to demonstrate statistically significant improvement in mobility and quality of life.    Baseline 8/10: 43    Time 12    Period Weeks    Status New    Target Date 05/03/21      PT LONG TERM GOAL #2   Title Pt will decrease DHI score by at least 18 points in order to demonstrate clinically significant reduction in disability    Baseline 8/10: 54    Time 12    Status New    Target Date  05/03/21      PT LONG TERM GOAL #3   Title Pt will improve ABC by at least 13% in order to demonstrate clinically significant improvement in balance confidence.    Baseline 8/10: deferred    Time 43    Period Weeks    Status New    Target Date 05/03/21      PT LONG TERM GOAL #4   Title Pt will decrease TUG to below 14 seconds/decrease in order to demonstrate decreased fall risk.    Baseline 8/10: deferred    Time 66    Period Weeks    Status New    Target Date 05/03/21                    Plan - 02/09/21 0826     Clinical Impression Statement Pt is a pleasant 87 year-old female referred for dizziness with additional reports of deconditioning, reduced balance, ADL and gait ability secondary to dizziness. PT examination reveals abnormal saccades, smooth pursuits and L Head Impulse testing. DHI indicates moderate perception of handicap d/t dizziness (daughter fills out questionnaires for pt d/t pt having dementia). Pt also with positive L Dix-Hallpike with nystagmus present. PT treated pt with L side Epley maneuver 1x where pt reported some dizziness after maneuver completed. Further treatments with Epley may be necessary if dizziness not fully resolved. Pt does also report lightheadedness with standing up, indicating dizziness may be multifactorial. Orthostatics to be tested future session as pt is able, to determine if BP contributing to sx. The pt will benefit from further skilled PT services to address the aforementioned deficits in order to decrease fall risk and improve pt's ability to safely ambulate and participate in ADLs.    Personal Factors and Comorbidities Age;Comorbidity 1;Comorbidity 3+;Comorbidity 2;Sex;Time since onset of injury/illness/exacerbation;Fitness    Comorbidities per pt and daughter pt has dementia, other PMH of afib, HTN, T2DM, HLD, CKD, altered mental status, acute encephalopathy, postural dizziness with near syncope    Examination-Activity Limitations  Bathing;Bed Mobility;Bend;Caring for Others;Dressing;Hygiene/Grooming;Lift;Locomotion Level;Self Feeding;Stand;Toileting;Squat;Stairs;Transfers    Examination-Participation Restrictions Medication Management;Driving;Cleaning;Meal Prep;Yard Work;Community Activity;Laundry;Shop;Other   per daughter pt has become more deconditioned since onset of dizziness and now requires more assistance with ADLs, hx of falls, uses RW   Stability/Clinical Decision Making Unstable/Unpredictable    Clinical Decision Making High    Rehab Potential Fair    PT Frequency 2x / week    PT Duration 12 weeks    PT Treatment/Interventions ADLs/Self Care Home Management;Biofeedback;Canalith Repostioning;Cryotherapy;Moist Heat;DME Instruction;Gait training;Stair training;Functional mobility training;Therapeutic  activities;Therapeutic exercise;Balance training;Neuromuscular re-education;Patient/family education;Orthotic Fit/Training;Wheelchair mobility training;Manual techniques;Passive range of motion;Energy conservation;Splinting;Taping;Vestibular;Visual/perceptual remediation/compensation    PT Next Visit Plan Perform MMT, orthostatics further balance testing and/or vestibular maneuvers, initiate HEP (VORx1, seated)    PT Home Exercise Plan to be initiated    Consulted and Agree with Plan of Care Patient;Family member/caregiver    Family Member Consulted Daughter             Patient will benefit from skilled therapeutic intervention in order to improve the following deficits and impairments:  Abnormal gait, Decreased activity tolerance, Decreased endurance, Decreased strength, Dizziness, Hypomobility, Improper body mechanics, Cardiopulmonary status limiting activity, Decreased balance, Decreased mobility, Difficulty walking, Impaired vision/preception, Postural dysfunction, Obesity  Visit Diagnosis: Dizziness and giddiness  Unsteadiness on feet  Other abnormalities of gait and mobility     Problem List Patient  Active Problem List   Diagnosis Date Noted   Acute encephalopathy 04/11/2020   AMS (altered mental status) 04/10/2020   Postural dizziness with near syncope 03/02/2019   Ricard Dillon PT, DPT 02/09/2021, 9:02 AM  [facility] [address] [place], [place], [address] Phone: [phone]   Fax:  [phone]  Name: ALAUNI PARISEAU MRN: [id number] Date of Birth: 09/11/1925

## 2021-02-15 ENCOUNTER — Other Ambulatory Visit: Payer: Self-pay

## 2021-02-15 ENCOUNTER — Ambulatory Visit: Payer: Medicare Other

## 2021-02-15 DIAGNOSIS — R2689 Other abnormalities of gait and mobility: Secondary | ICD-10-CM

## 2021-02-15 DIAGNOSIS — R2681 Unsteadiness on feet: Secondary | ICD-10-CM

## 2021-02-15 DIAGNOSIS — R42 Dizziness and giddiness: Secondary | ICD-10-CM

## 2021-02-15 NOTE — Therapy (Signed)
[place] MAIN [facility] SERVICES [address] [place], [place], [address] Phone: [phone]   Fax:  [phone]  Physical Therapy Treatment  Patient Details  Name: Erica Donovan MRN: [id number] Date of Birth: [birth year] Referring Provider (PT): Anabel Bene, MD   Encounter Date: 02/15/2021   PT End of Session - 02/15/21 1658     Visit Number 2    Number of Visits 25    Date for PT Re-Evaluation 05/03/21    Authorization Type Medicare    Authorization Time Period 02/08/21-05/03/21    PT Start Time 1605    PT Stop Time [id number]    PT Time Calculation (min) 45 min    Equipment Utilized During Treatment Gait belt    Activity Tolerance Patient tolerated treatment well;Patient limited by pain    Behavior During Therapy WFL for tasks assessed/performed             Past Medical History:  Diagnosis Date   Arthritis    Cancer ([place])    skin cancer   COPD (chronic obstructive pulmonary disease) ([place])    mild- no inhalers   Dementia ([place])    Depression    Diabetes mellitus without complication ([place])    Eye abnormality    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-[facility] keeps check on this   GERD (gastroesophageal reflux disease)    Hemorrhoids    History of kidney stones    Hyperlipidemia    Hypertension    Leaky heart valve    Paroxysmal SVT (supraventricular tachycardia) ([place])     Past Surgical History:  Procedure Laterality Date   ABDOMINAL HYSTERECTOMY     APPENDECTOMY  1945   EYE SURGERY     cataract bil   HEMORRHOID SURGERY     JOINT REPLACEMENT Left 2008   ORIF WRIST FRACTURE Right 11/06/2018   Procedure: OPEN REDUCTION INTERNAL FIXATION (ORIF) WRIST FRACTURE RIGHT - DIABETIC, SLEEP APNEA;  Surgeon: Hessie Knows, MD;  Location: [facility] ORS;  Service: Orthopedics;  Laterality: Right;    There were no vitals filed for this visit.       [facility] PT Assessment - 02/15/21 0001       ROM / Strength   AROM /  PROM / Strength Strength      Strength   Overall Strength --   chronic Rt knee OA related pain with testing hip rotation Rt, Knee flexion/extension   Strength Assessment Site Hip;Knee;Ankle    Right/Left Hip Right;Left    Right Hip Flexion 5/5    Right Hip Extension 4-/5    Right Hip External Rotation  5/5    Right Hip Internal Rotation 5/5    Right Hip ADduction 5/5    Left Hip Flexion 5/5    Left Hip Extension 4/5    Left Hip External Rotation 5/5    Left Hip Internal Rotation 5/5    Left Hip ADduction 5/5    Right/Left Knee Right;Left    Right Knee Flexion 5/5    Right Knee Extension 5/5    Left Knee Flexion 5/5    Left Knee Extension 5/5    Right/Left Ankle Right;Left    Right Ankle Dorsiflexion 5/5    Left Ankle Dorsiflexion 5/5      Transfers   Five time sit to stand comments  17.18sec      Ambulation/Gait   Ambulation Distance (Feet) 450 Feet    Assistive device 4-wheeled walker  Gait Pattern --   reduced Rt knee flexion in gait cycle   Gait Comments reports knee pain worsening after 353f, noted mild beginnings of Rt antalgia      Standardized Balance Assessment   Standardized Balance Assessment Mini-BESTest;Vestibular Evaluation             -Seated VOR training 1x30sec using Alphanumeric targets on paper, 1x30 with cues for gaze fixation on author's nose and modA facilitation cuing of cervical rotation (does have some loss of gaze second set), DTR encouraged to use her nose as a target as it is easier to cue in the setting of the patient's cognitive impairment.  -Standing eyes closed, unsupported 30sec      PT Short Term Goals - 02/08/21 1628       PT SHORT TERM GOAL #1   Title Pt will be independent with HEP in order to improve strength and balance in order to decrease fall risk and improve function at home and work.    Baseline 8/10: to be initiated    Time 6    Period Weeks    Status New    Target Date 03/22/21      PT SHORT TERM GOAL #2    Title --      PT SHORT TERM GOAL #3   Title --      PT SHORT TERM GOAL #4   Title --      PT SHORT TERM GOAL #5   Title --               PT Long Term Goals - 02/09/21 0855       PT LONG TERM GOAL #1   Title Patient will increase FOTO score to equal to or greater than 50 to demonstrate statistically significant improvement in mobility and quality of life.    Baseline 8/10: 43    Time 12    Period Weeks    Status New    Target Date 05/03/21      PT LONG TERM GOAL #2   Title Pt will decrease DHI score by at least 18 points in order to demonstrate clinically significant reduction in disability    Baseline 8/10: 54    Time 12    Status New    Target Date 05/03/21      PT LONG TERM GOAL #3   Title Pt will improve ABC by at least 13% in order to demonstrate clinically significant improvement in balance confidence.    Baseline 8/10: deferred    Time 144   Period Weeks    Status New    Target Date 05/03/21      PT LONG TERM GOAL #4   Title Pt will decrease TUG to below 14 seconds/decrease in order to demonstrate decreased fall risk.    Baseline 8/10: deferred    Time 171   Period Weeks    Status New    Target Date 05/03/21                   Plan - 02/15/21 1702     Clinical Impression Statement Pt returns to visit 2, DTR reports fewer episodes of dizziness this week, however none apparently long-lasting and all occuring insidiously while sitting. Pt is not a reliable historian due to dementia, but does communicate her need for rest breaks, symptoms response (of knee), and follows simple commands well. Pt struggles to offer any reliable feedback today regarding dizziness symptoms, however she does not  present as being dizzy during any part of session. Orthostatic BP is flat overall. Pt AMB up to 430f c 4WW, grossly symetrical, with some knee antalgia toward the end. Pt partakes in VOR retraining exercise, also explained to DTR, but requires extensive cuing to  perform, some hesitancy with rotating head right. Also added eyes closed balance to HEP. Deferred additional Left BPPV treatment to later visits as necessary. Of note pt had taken midodrine just prior to arrival.    Personal Factors and Comorbidities Age;Comorbidity 1;Comorbidity 3+;Comorbidity 2;Sex;Time since onset of injury/illness/exacerbation;Fitness    Comorbidities per pt and daughter pt has dementia, other PMH of afib, HTN, T2DM, HLD, CKD, altered mental status, acute encephalopathy, postural dizziness with near syncope    Examination-Activity Limitations Bathing;Bed Mobility;Bend;Caring for Others;Dressing;Hygiene/Grooming;Lift;Locomotion Level;Self Feeding;Stand;Toileting;Squat;Stairs;Transfers    Examination-Participation Restrictions Medication Management;Driving;Cleaning;Meal Prep;Yard Work;Community Activity;Laundry;Shop;Other    Stability/Clinical Decision Making Unstable/Unpredictable    Clinical Decision Making High    Rehab Potential Fair    PT Frequency 2x / week    PT Duration 12 weeks    PT Treatment/Interventions ADLs/Self Care Home Management;Biofeedback;Canalith Repostioning;Cryotherapy;Moist Heat;DME Instruction;Gait training;Stair training;Functional mobility training;Therapeutic activities;Therapeutic exercise;Balance training;Neuromuscular re-education;Patient/family education;Orthotic Fit/Training;Wheelchair mobility training;Manual techniques;Passive range of motion;Energy conservation;Splinting;Taping;Vestibular;Visual/perceptual remediation/compensation    PT Next Visit Plan Perform MMT, orthostatics further balance testing and/or vestibular maneuvers, initiate HEP (VORx1, seated)    PT Home Exercise Plan At visit 2, issued VOR training and eyes closed balance. DTR educated on helping.    Consulted and Agree with Plan of Care Patient;Family member/caregiver    Family Member Consulted Daughter             Patient will benefit from skilled therapeutic intervention  in order to improve the following deficits and impairments:  Abnormal gait, Decreased activity tolerance, Decreased endurance, Decreased strength, Dizziness, Hypomobility, Improper body mechanics, Cardiopulmonary status limiting activity, Decreased balance, Decreased mobility, Difficulty walking, Impaired vision/preception, Postural dysfunction, Obesity  Visit Diagnosis: Dizziness and giddiness  Unsteadiness on feet  Other abnormalities of gait and mobility     Problem List Patient Active Problem List   Diagnosis Date Noted   Acute encephalopathy 04/11/2020   AMS (altered mental status) 04/10/2020   Postural dizziness with near syncope 03/02/2019   5:12 PM, 02/15/21 AEtta Grandchild PT, DPT Physical Therapist - [phone]    [place] 02/15/2021, 5:09 PM  [place] [address]Phone: [phone]  Fax:  [phone] Name: EDANASHIA BOHLEYMRN: [id number] of Birth: 5March 28, 1927

## 2021-02-20 ENCOUNTER — Other Ambulatory Visit: Payer: Self-pay

## 2021-02-20 ENCOUNTER — Ambulatory Visit: Payer: Medicare Other

## 2021-02-20 DIAGNOSIS — R2689 Other abnormalities of gait and mobility: Secondary | ICD-10-CM

## 2021-02-20 DIAGNOSIS — R42 Dizziness and giddiness: Secondary | ICD-10-CM | POA: Diagnosis not present

## 2021-02-20 DIAGNOSIS — R2681 Unsteadiness on feet: Secondary | ICD-10-CM

## 2021-02-20 NOTE — Therapy (Signed)
[place] MAIN [facility] SERVICES [address] [place], [place], [address] Phone: [phone]   Fax:  [phone]  Physical Therapy Treatment  Patient Details  Name: Erica Donovan MRN: [id number] Date of Birth: [birth year] Referring Provider (PT): Erica Bene, MD   Encounter Date: 02/20/2021   PT End of Session - 02/20/21 1311     Visit Number 3    Number of Visits 25    Date for PT Re-Evaluation 05/03/21    Authorization Type Medicare    Authorization Time Period 02/08/21-05/03/21    PT Start Time 1302    PT Stop Time 1340    PT Time Calculation (min) 38 min    Equipment Utilized During Treatment Gait belt    Activity Tolerance Patient tolerated treatment well;Patient limited by pain    Behavior During Therapy WFL for tasks assessed/performed             Past Medical History:  Diagnosis Date   Arthritis    Cancer ([place])    skin cancer   COPD (chronic obstructive pulmonary disease) ([place])    mild- no inhalers   Dementia ([place])    Depression    Diabetes mellitus without complication ([place])    Eye abnormality    [place] retinal vein occlusion of left eye-[facility] on [facility]   GERD (gastroesophageal reflux disease)    Hemorrhoids    History of kidney stones    Hyperlipidemia    Hypertension    Leaky heart valve    Paroxysmal SVT (supraventricular tachycardia) ([place])     Past Surgical History:  Procedure Laterality Date   ABDOMINAL HYSTERECTOMY     APPENDECTOMY  1945   EYE SURGERY     cataract bil   HEMORRHOID SURGERY     JOINT REPLACEMENT Left 2008   ORIF WRIST FRACTURE Right 11/06/2018   Procedure: OPEN REDUCTION INTERNAL FIXATION (ORIF) WRIST FRACTURE RIGHT - DIABETIC, SLEEP APNEA;  Surgeon: Erica Knows, MD;  Location: [facility] ORS;  Service: Orthopedics;  Laterality: Right;    There were no vitals filed for this visit.   Subjective Assessment - 02/20/21 1305     Subjective Pt doing well today, was  able to walk outside with family and RW 3 tinmes since last visit, all went well per DTR. Pt has had no dizzy spells since prior. Pt did take par tin 2 new HEP without any trouble or dizziness.    Pertinent History Pt presents to PT evaluation with her daughter. Pt prefers to go by [name]. Pt has dementia and has some difficulty answering questions; her daughter answers most questions on her behalf. The pt has had dizzy spells on and off for a few years. Worst recent dizzy spell occurred about a month ago where pt had trouble getting out of bed. Pt also with fall d/t dizziness last may that resulted in a brain bleed. Pt has seen neurologist and currently they are unsure if dizziness is d/t crystals or if it is cardiac related. Pt does report she sometimes feels like she is going to pass out. She also reports the room sometimes looks as if it is spinning. She reports worst sx with initially standing up. She is sometimes dizzy when she is not moving. However, it is unclear if she is turning her head when this happens. Pt unsure of duration of sx. Her daughter reports that pt's dizziness has lasted at least an hour before. Pt unsure if she has  been nauseated with her sx. Pt does have tinnitus. Pt sometimes has headaches, but report they are not migraines. Pt unsure of what makes her sx better. She does report she remains still until sx pass. Pt with hx of double vision, is being followed by a doctor d/t bleed behind L eye (still being monitored). Pt has fallen 2x in the last six months. They have been trying exercises at home where pt lays down and turns her head, but this at times makes her sx worse. Other PMH includes afib, HTN, T2DM, HLD, CKD, altered mental status, acute encephalopathy, postural dizziness with near syncope.    Currently in Pain? No/denies              INTERVENTION THIS DATE:  126/56 77bpm seated 128/49 90bpm standing   363f 4:00, RW clockwise minGuard assist to supervsion  -10xSTS  hands free  -LATERAL SIDESTEPPING 2x173fbilat, no device, min guard assist.  -standing firm surface, trunk rotation, transfer 15 obkjects from 1 side to other; repeat on foam  -10x STS from table  -30040f:35, RW counter clockwise, minGuard assist to supervsion   -foam pad ball toss to DTR (clown nose ball) 25x minGuard assist with intermittent minA for recovery           PT Education - 02/20/21 1309     Education Details safety with activity at home    Person(s) Educated Patient    Methods Explanation;Demonstration    Comprehension Verbalized understanding              PT Short Term Goals - 02/08/21 1628       PT SHORT TERM GOAL #1   Title Pt will be independent with HEP in order to improve strength and balance in order to decrease fall risk and improve function at home and work.    Baseline 8/10: to be initiated    Time 6    Period Weeks    Status New    Target Date 03/22/21      PT SHORT TERM GOAL #2   Title --      PT SHORT TERM GOAL #3   Title --      PT SHORT TERM GOAL #4   Title --      PT SHORT TERM GOAL #5   Title --               PT Erica Term Goals - 02/09/21 0855       PT Erica TERM GOAL #1   Title Patient will increase FOTO score to equal to or greater than 50 to demonstrate statistically significant improvement in mobility and quality of life.    Baseline 8/10: 43    Time 12    Period Weeks    Status New    Target Date 05/03/21      PT Erica TERM GOAL #2   Title Pt will decrease DHI score by at least 18 points in order to demonstrate clinically significant reduction in disability    Baseline 8/10: 54    Time 12    Status New    Target Date 05/03/21      PT Erica TERM GOAL #3   Title Pt will improve ABC by at least 13% in order to demonstrate clinically significant improvement in balance confidence.    Baseline 8/10: deferred    Time 12 77 Period Weeks    Status New    Target Date 05/03/21  PT Erica TERM GOAL #4    Title Pt will decrease TUG to below 14 seconds/decrease in order to demonstrate decreased fall risk.    Baseline 8/10: deferred    Time 33    Period Weeks    Status New    Target Date 05/03/21                   Plan - 02/20/21 1317     Clinical Impression Statement Continued with current plan of care as laid out in evaluation and recent prior sessions. Pt remains motivated to advance progress toward goals in order to maximize independence and safety at home. Pt requires high level assistance and cuing for completion of exercises in order to provide adequate level of stimulation and perturbation. Author allows pt as much opportunity as possible to perform independent righting strategies, only stepping in when pt is unable to prevent falling to floor. Successfully able to advance AMB distance is session, and STS reps. Pt shows appropriate caution when balance is compromised. Author attempted to trick ST memory during item transfer, but patient exhibited no confusion or ST memory impairment in this area.    Personal Factors and Comorbidities Age;Comorbidity 1;Comorbidity 3+;Comorbidity 2;Sex;Time since onset of injury/illness/exacerbation;Fitness    Comorbidities per pt and daughter pt has dementia, other PMH of afib, HTN, T2DM, HLD, CKD, altered mental status, acute encephalopathy, postural dizziness with near syncope    Examination-Activity Limitations Bathing;Bed Mobility;Bend;Caring for Others;Dressing;Hygiene/Grooming;Lift;Locomotion Level;Self Feeding;Stand;Toileting;Squat;Stairs;Transfers    Examination-Participation Restrictions Medication Management;Driving;Cleaning;Meal Prep;Yard Work;Community Activity;Laundry;Shop;Other    Stability/Clinical Decision Making Unstable/Unpredictable    Clinical Decision Making High    Rehab Potential Fair    PT Frequency 2x / week    PT Duration 12 weeks    PT Treatment/Interventions ADLs/Self Care Home Management;Biofeedback;Canalith  Repostioning;Cryotherapy;Moist Heat;DME Instruction;Gait training;Stair training;Functional mobility training;Therapeutic activities;Therapeutic exercise;Balance training;Neuromuscular re-education;Patient/family education;Orthotic Fit/Training;Wheelchair mobility training;Manual techniques;Passive range of motion;Energy conservation;Splinting;Taping;Vestibular;Visual/perceptual remediation/compensation    PT Next Visit Plan Continue with gross mobility training    PT Home Exercise Plan At visit 2, issued VOR training and eyes closed balance. DTR educated on helping.    Consulted and Agree with Plan of Care Patient;Family member/caregiver    Family Member Consulted Daughter             Patient will benefit from skilled therapeutic intervention in order to improve the following deficits and impairments:  Abnormal gait, Decreased activity tolerance, Decreased endurance, Decreased strength, Dizziness, Hypomobility, Improper body mechanics, Cardiopulmonary status limiting activity, Decreased balance, Decreased mobility, Difficulty walking, Impaired vision/preception, Postural dysfunction, Obesity  Visit Diagnosis: Dizziness and giddiness  Unsteadiness on feet  Other abnormalities of gait and mobility     Problem List Patient Active Problem List   Diagnosis Date Noted   Acute encephalopathy 04/11/2020   AMS (altered mental status) 04/10/2020   Postural dizziness with near syncope 03/02/2019   1:46 PM, 02/20/21 Etta Grandchild, PT, DPT Physical Therapist - [place] 02/20/2021, 1:29 PM  [place] [address] [place], [place], [address] Phone: [phone]   Fax:  [phone]  Name: Erica [place] MRN: [id number] Date of Birth: 07-11-1925

## 2021-02-23 ENCOUNTER — Other Ambulatory Visit: Payer: Self-pay

## 2021-02-23 ENCOUNTER — Ambulatory Visit: Payer: Medicare Other

## 2021-02-23 DIAGNOSIS — M6281 Muscle weakness (generalized): Secondary | ICD-10-CM

## 2021-02-23 DIAGNOSIS — R42 Dizziness and giddiness: Secondary | ICD-10-CM | POA: Diagnosis not present

## 2021-02-23 DIAGNOSIS — R2681 Unsteadiness on feet: Secondary | ICD-10-CM

## 2021-02-23 DIAGNOSIS — R269 Unspecified abnormalities of gait and mobility: Secondary | ICD-10-CM

## 2021-02-23 DIAGNOSIS — R262 Difficulty in walking, not elsewhere classified: Secondary | ICD-10-CM

## 2021-02-23 NOTE — Therapy (Signed)
[place] MAIN [facility] SERVICES [address] [place], [place], [address] Phone: [phone]   Fax:  [phone]  Physical Therapy Treatment  Patient Details  Name: Erica Donovan MRN: [id number] Date of Birth: [birth year] Referring Provider (PT): Erica Bene, MD   Encounter Date: 02/23/2021   PT End of Session - 02/23/21 0944     Visit Number 4    Number of Visits 25    Date for PT Re-Evaluation 05/03/21    Authorization Type Medicare    Authorization Time Period 02/08/21-05/03/21    PT Start Time 1515    PT Stop Time 1558    PT Time Calculation (min) 43 min    Equipment Utilized During Treatment Gait belt    Activity Tolerance Patient tolerated treatment well;Patient limited by pain    Behavior During Therapy WFL for tasks assessed/performed             Past Medical History:  Diagnosis Date   Arthritis    Cancer ([place])    skin cancer   COPD (chronic obstructive pulmonary disease) ([place])    mild- no inhalers   Dementia ([place])    Depression    Diabetes mellitus without complication ([place])    Eye abnormality    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-[facility] keeps check on this   GERD (gastroesophageal reflux disease)    Hemorrhoids    History of kidney stones    Hyperlipidemia    Hypertension    Leaky heart valve    Paroxysmal SVT (supraventricular tachycardia) ([place])     Past Surgical History:  Procedure Laterality Date   ABDOMINAL HYSTERECTOMY     APPENDECTOMY  1945   EYE SURGERY     cataract bil   HEMORRHOID SURGERY     JOINT REPLACEMENT Left 2008   ORIF WRIST FRACTURE Right 11/06/2018   Procedure: OPEN REDUCTION INTERNAL FIXATION (ORIF) WRIST FRACTURE RIGHT - DIABETIC, SLEEP APNEA;  Surgeon: Erica Knows, MD;  Location: [facility] ORS;  Service: Orthopedics;  Laterality: Right;    There were no vitals filed for this visit.   Subjective Assessment - 02/23/21 1521     Subjective Patient reports she is  tired but otherwise doing well. Daughter is also present and reports the patient only had 1 episode of dizziness on Monday which only last a few seconds. She reports she has been compliant with walking and performing her sit to stand exercises.    Patient is accompained by: Family member    Pertinent History Pt presents to PT evaluation with her daughter. Pt prefers to go by [name]. Pt has dementia and has some difficulty answering questions; her daughter answers most questions on her behalf. The pt has had dizzy spells on and off for a few years. Worst recent dizzy spell occurred about a month ago where pt had trouble getting out of bed. Pt also with fall d/t dizziness last may that resulted in a brain bleed. Pt has seen neurologist and currently they are unsure if dizziness is d/t crystals or if it is cardiac related. Pt does report she sometimes feels like she is going to pass out. She also reports the room sometimes looks as if it is spinning. She reports worst sx with initially standing up. She is sometimes dizzy when she is not moving. However, it is unclear if she is turning her head when this happens. Pt unsure of duration of sx. Her daughter reports that pt's dizziness has  lasted at least an hour before. Pt unsure if she has been nauseated with her sx. Pt does have tinnitus. Pt sometimes has headaches, but report they are not migraines. Pt unsure of what makes her sx better. She does report she remains still until sx pass. Pt with hx of double vision, is being followed by a doctor d/t bleed behind L eye (still being monitored). Pt has fallen 2x in the last six months. They have been trying exercises at home where pt lays down and turns her head, but this at times makes her sx worse. Other PMH includes afib, HTN, T2DM, HLD, CKD, altered mental status, acute encephalopathy, postural dizziness with near syncope.    Limitations Standing;Walking;House hold activities;Lifting    How long can you sit  comfortably? not limited    How long can you stand comfortably? Pt can stand for about 10 minutes    How long can you walk comfortably? can walk from chair to bathroom with her RW, prior to onset of dizziness could walk up and down her street    Diagnostic tests per chart:  MR brain WO contrast 8/5: No evidence of acute intracranial abnormality. Moderate chronic small vessel ischemic changes within the cerebral   white matter, stable as compared to the brain MRI of 04/11/2020.   Moderate generalized cerebral atrophy. Comparatively mild cerebellar   atrophy. These findings are also stable. Severe mucosal thickening within the right maxillary sinus, progressed.    Patient Stated Goals To make the dizziness go away    Currently in Pain? Yes    Pain Score --   patient did not rate   Pain Location Back    Pain Orientation Posterior;Lower    Pain Descriptors / Indicators Aching    Pain Type Chronic pain    Pain Onset More than a month ago    Pain Frequency Intermittent    Aggravating Factors  moving around    Pain Relieving Factors rest    Effect of Pain on Daily Activities just slows me down              INTERVENTIONS:   121/68  81 bpm seated 160/88  88 bpm standing   Neromuscular re-education:   Ambulation in hallway x 200 feet - using 4WW with CGA- patient able to walk straight ahead and perform dynamic head turning calling out item listed on strategically placed stickly notes on wall. No LOB and good ability to recall all letters/symbols.   Static stand at support bar performing the following:   Feet apart - eyes open then eyes closed 20 sec each  followed by head turns with EO/EC with no unsteadiness.  Feet narrowed - eyes open then closed 20 sec each  followed by head turning with no unsteadiness Feet staggered - Same as above- Mild unsteadiness - worsening with eyes closed or head turning.   Therapeutic Exercises:   Instructed in standing LE Strengthening exercises at  support bar  Hip march Hip Ext Hip abd Minisquats Calf raises Toe raises  10 reps each LE (Daughter performed with PT and patient). Patient easily distracted so required increased VC/TC and demonstration.  Education provided throughout session via VC/TC and demonstration to facilitate movement at target joints and correct muscle activation for all testing and exercises performed.   Clinical Impression: Patient demonstrated good balance today with walking in hallways with dynamic head turning- no loss of balance or dizziness exhibited. Due to her impaired cognition- multiple repetitive VC and visual demo  required to perform all exercises today. Daughter able to perform exercises with patient today and to assist with compliance at home. The pt will benefit from further skilled PT services to address the aforementioned deficits in order to decrease fall risk and improve pt's ability to safely ambulate and participate in ADLs                           PT Education - 02/24/21 0943     Education Details safety education with walking/balance and Ed in LE strengthening    Person(s) Educated Patient;Child(ren)   patient's daughter   Methods Explanation    Comprehension Verbalized understanding;Returned demonstration;Verbal cues required;Tactile cues required;Need further instruction              PT Short Term Goals - 02/08/21 1628       PT SHORT TERM GOAL #1   Title Pt will be independent with HEP in order to improve strength and balance in order to decrease fall risk and improve function at home and work.    Baseline 8/10: to be initiated    Time 6    Period Weeks    Status New    Target Date 03/22/21      PT SHORT TERM GOAL #2   Title --      PT SHORT TERM GOAL #3   Title --      PT SHORT TERM GOAL #4   Title --      PT SHORT TERM GOAL #5   Title --               PT Long Term Goals - 02/09/21 0855       PT LONG TERM GOAL #1   Title Patient  will increase FOTO score to equal to or greater than 50 to demonstrate statistically significant improvement in mobility and quality of life.    Baseline 8/10: 43    Time 12    Period Weeks    Status New    Target Date 05/03/21      PT LONG TERM GOAL #2   Title Pt will decrease DHI score by at least 18 points in order to demonstrate clinically significant reduction in disability    Baseline 8/10: 54    Time 12    Status New    Target Date 05/03/21      PT LONG TERM GOAL #3   Title Pt will improve ABC by at least 13% in order to demonstrate clinically significant improvement in balance confidence.    Baseline 8/10: deferred    Time 38    Period Weeks    Status New    Target Date 05/03/21      PT LONG TERM GOAL #4   Title Pt will decrease TUG to below 14 seconds/decrease in order to demonstrate decreased fall risk.    Baseline 8/10: deferred    Time 36    Period Weeks    Status New    Target Date 05/03/21                   Plan - 02/23/21 0947     Clinical Impression Statement Patient demonstrated good balance today with walking in hallways with dynamic head turning- no loss of balance or dizziness exhibited. Due to her impaired cognition- multiple repetitive VC and visual demo required to perform all exercises today. Daughter able to perform exercises with patient today and to assist  with compliance at home. The pt will benefit from further skilled PT services to address the aforementioned deficits in order to decrease fall risk and improve pt's ability to safely ambulate and participate in ADLs    Personal Factors and Comorbidities Age;Comorbidity 1;Comorbidity 3+;Comorbidity 2;Sex;Time since onset of injury/illness/exacerbation;Fitness    Comorbidities per pt and daughter pt has dementia, other PMH of afib, HTN, T2DM, HLD, CKD, altered mental status, acute encephalopathy, postural dizziness with near syncope    Examination-Activity Limitations Bathing;Bed  Mobility;Bend;Caring for Others;Dressing;Hygiene/Grooming;Lift;Locomotion Level;Self Feeding;Stand;Toileting;Squat;Stairs;Transfers    Examination-Participation Restrictions Medication Management;Driving;Cleaning;Meal Prep;Yard Work;Community Activity;Laundry;Shop;Other    Stability/Clinical Decision Making Unstable/Unpredictable    Rehab Potential Fair    PT Frequency 2x / week    PT Duration 12 weeks    PT Treatment/Interventions ADLs/Self Care Home Management;Biofeedback;Canalith Repostioning;Cryotherapy;Moist Heat;DME Instruction;Gait training;Stair training;Functional mobility training;Therapeutic activities;Therapeutic exercise;Balance training;Neuromuscular re-education;Patient/family education;Orthotic Fit/Training;Wheelchair mobility training;Manual techniques;Passive range of motion;Energy conservation;Splinting;Taping;Vestibular;Visual/perceptual remediation/compensation    PT Next Visit Plan Continue with balance training and progressive LE strengthening.    PT Home Exercise Plan At visit 2, issued VOR training and eyes closed balance. DTR educated on helping. Added some standing LE strengthening and will need to issue handout next visit.    Consulted and Agree with Plan of Care Patient;Family member/caregiver    Family Member Consulted Daughter             Patient will benefit from skilled therapeutic intervention in order to improve the following deficits and impairments:  Abnormal gait, Decreased activity tolerance, Decreased endurance, Decreased strength, Dizziness, Hypomobility, Improper body mechanics, Cardiopulmonary status limiting activity, Decreased balance, Decreased mobility, Difficulty walking, Impaired vision/preception, Postural dysfunction, Obesity  Visit Diagnosis: Abnormality of gait and mobility  Difficulty in walking, not elsewhere classified  Muscle weakness (generalized)  Unsteadiness on feet     Problem List Patient Active Problem List   Diagnosis  Date Noted   Acute encephalopathy 04/11/2020   AMS (altered mental status) 04/10/2020   Postural dizziness with near syncope 03/02/2019    Lewis Moccasin, PT 02/24/2021, 9:53 AM  [place], [place], [address] Phone: [phone]   Fax:  [phone]  Name: Erica Donovan MRN: [id number] Date of Birth: [birth year]

## 2021-02-28 ENCOUNTER — Ambulatory Visit: Payer: Medicare Other

## 2021-02-28 ENCOUNTER — Other Ambulatory Visit: Payer: Self-pay

## 2021-02-28 DIAGNOSIS — R42 Dizziness and giddiness: Secondary | ICD-10-CM | POA: Diagnosis not present

## 2021-02-28 DIAGNOSIS — M6281 Muscle weakness (generalized): Secondary | ICD-10-CM

## 2021-02-28 DIAGNOSIS — R2681 Unsteadiness on feet: Secondary | ICD-10-CM

## 2021-02-28 DIAGNOSIS — R2689 Other abnormalities of gait and mobility: Secondary | ICD-10-CM

## 2021-02-28 NOTE — Therapy (Signed)
[place] MAIN [facility] SERVICES [address] [place], [place], [address] Phone: [phone]   Fax:  [phone]  Physical Therapy Treatment  Patient Details  Name: Erica Donovan MRN: [id number] Date of Birth: 02-07-1926 Referring Provider (PT): Erica Bene, MD   Encounter Date: 02/28/2021   PT End of Session - 02/28/21 1323     Visit Number 5    Number of Visits 25    Date for PT Re-Evaluation 05/03/21    Authorization Type Medicare    Authorization Time Period 02/08/21-05/03/21    PT Start Time 39    PT Stop Time 1229    PT Time Calculation (min) 39 min    Equipment Utilized During Treatment Gait belt    Activity Tolerance Patient tolerated treatment well;Patient limited by pain    Behavior During Therapy WFL for tasks assessed/performed             Past Medical History:  Diagnosis Date   Arthritis    Cancer ([place])    skin cancer   COPD (chronic obstructive pulmonary disease) ([place])    mild- no inhalers   Dementia (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-[facility] keeps check on this   GERD (gastroesophageal reflux disease)    Hemorrhoids    History of kidney stones    Hyperlipidemia    Hypertension    Leaky heart valve    Paroxysmal SVT (supraventricular tachycardia) ([place])     Past Surgical History:  Procedure Laterality Date   ABDOMINAL HYSTERECTOMY     APPENDECTOMY  1945   EYE SURGERY     cataract bil   HEMORRHOID SURGERY     JOINT REPLACEMENT Left 2008   ORIF WRIST FRACTURE Right 11/06/2018   Procedure: OPEN REDUCTION INTERNAL FIXATION (ORIF) WRIST FRACTURE RIGHT - DIABETIC, SLEEP APNEA;  Surgeon: Erica Knows, MD;  Location: [facility] ORS;  Service: Orthopedics;  Laterality: Right;    There were no vitals filed for this visit.   Subjective Assessment - 02/28/21 1152     Subjective Pt daughter present with  pt. Pt is walking more and has exercised since last appointment. Pt reports she has not been dizzy recently. Pt reports no pain. Pt daughter says pt only had one instance of dizziness last week.    Patient is accompained by: Family member    Pertinent History Pt presents to PT evaluation with her daughter. Pt prefers to go by Erica Donovan. Pt has dementia and has some difficulty answering questions; her daughter answers most questions on her behalf. The pt has had dizzy spells on and off for a few years. Worst recent dizzy spell occurred about a month ago where pt had trouble getting out of bed. Pt also with fall d/t dizziness last may that resulted in a brain bleed. Pt has seen neurologist and currently they are unsure if dizziness is d/t crystals or if it is cardiac related. Pt does report she sometimes feels like she is going to pass out. She also reports the room sometimes looks as if it is spinning. She reports worst sx with initially standing up. She is sometimes dizzy when she is not moving. However, it is unclear if she is turning her head when this happens. Pt unsure of duration of sx. Her daughter reports that pt's dizziness has lasted at least an hour before. Pt  unsure if she has been nauseated with her sx. Pt does have tinnitus. Pt sometimes has headaches, but report they are not migraines. Pt unsure of what makes her sx better. She does report she remains still until sx pass. Pt with hx of double vision, is being followed by a doctor d/t bleed behind L eye (still being monitored). Pt has fallen 2x in the last six months. They have been trying exercises at home where pt lays down and turns her head, but this at times makes her sx worse. Other PMH includes afib, HTN, T2DM, HLD, CKD, altered mental status, acute encephalopathy, postural dizziness with near syncope.    Limitations Standing;Walking;House hold activities;Lifting    How long can you sit comfortably? not limited    How long can you stand  comfortably? Pt can stand for about 10 minutes    How long can you walk comfortably? can walk from chair to bathroom with her RW, prior to onset of dizziness could walk up and down her street    Diagnostic tests per chart:  MR brain WO contrast 8/5: No evidence of acute intracranial abnormality. Moderate chronic small vessel ischemic changes within the cerebral   white matter, stable as compared to the brain MRI of 04/11/2020.   Moderate generalized cerebral atrophy. Comparatively mild cerebellar   atrophy. These findings are also stable. Severe mucosal thickening within the right maxillary sinus, progressed.    Patient Stated Goals To make the dizziness go away    Pain Onset More than a month ago            TREATMENT   Ambulation throughout clinic x3.5 laps (approx 150-152 ft per lap)- using 4WW with CGA, emphasis on scanning for obstacles/safety awareness as well as cardioresp and LE mm endurance. Cuing for proximity to walker and avoidance of obstacles.   STS - 10-12 reps x 2 sets Frequent use of cues for safe technique, completes some partial ROM reps. PT provides TC for upright posture.   At support surface with CGA- WBOS, no UE support, with vertical and horizontal head turns - 10x for each in each direction. No decrease in postural stability, denies any sx.   Standing VORx1 with vertical, horizontal head-turns - 20-30 sec bouts for each x multiple attempts and reps. Requires TC from PT for technique. Pt with difficulty isolating cervical movement from moving trunk.Greatest difficulty with cervical rotation.  Seated VORx1 with vertical, horizontal head-turns - 20-30 sec bouts for 2-3 reps of each, PT provides hands-on assist throughout for technique. Pt reports no dizziness.   AROM for cervical rotation - 10x B Cervical rotatoin stretch 30 sec of each Frequent cuing required for technique.     PT Short Term Goals - 02/08/21 1628       PT SHORT TERM GOAL #1   Title Pt will be  independent with HEP in order to improve strength and balance in order to decrease fall risk and improve function at home and work.    Baseline 8/10: to be initiated    Time 6    Period Weeks    Status New    Target Date 03/22/21      PT SHORT TERM GOAL #2   Title --      PT SHORT TERM GOAL #3   Title --      PT SHORT TERM GOAL #4   Title --      PT SHORT TERM GOAL #5   Title --  PT Long Term Goals - 02/09/21 0855       PT LONG TERM GOAL #1   Title Patient will increase FOTO score to equal to or greater than 50 to demonstrate statistically significant improvement in mobility and quality of life.    Baseline 8/10: 43    Time 12    Period Weeks    Status New    Target Date 05/03/21      PT LONG TERM GOAL #2   Title Pt will decrease DHI score by at least 18 points in order to demonstrate clinically significant reduction in disability    Baseline 8/10: 54    Time 12    Status New    Target Date 05/03/21      PT LONG TERM GOAL #3   Title Pt will improve ABC by at least 13% in order to demonstrate clinically significant improvement in balance confidence.    Baseline 8/10: deferred    Time 10    Period Weeks    Status New    Target Date 05/03/21      PT LONG TERM GOAL #4   Title Pt will decrease TUG to below 14 seconds/decrease in order to demonstrate decreased fall risk.    Baseline 8/10: deferred    Time 42    Period Weeks    Status New    Target Date 05/03/21                   Plan - 02/28/21 1338     Clinical Impression Statement Pt shows continued improvement with decreased reports of dizziness sx. She had no dizziness with VOR exercises. While she shows improvement, she still exhibits poor endurance and decreased ability to scan for cues with ambulation, as well as impaired cervical ROM. The pt will benefit from further skilled PT to continue to improve dizziness sx, strength, endurance, ROM and gait ability to decrease fall risk.     Personal Factors and Comorbidities Age;Comorbidity 1;Comorbidity 3+;Comorbidity 2;Sex;Time since onset of injury/illness/exacerbation;Fitness    Comorbidities per pt and daughter pt has dementia, other PMH of afib, HTN, T2DM, HLD, CKD, altered mental status, acute encephalopathy, postural dizziness with near syncope    Examination-Activity Limitations Bathing;Bed Mobility;Bend;Caring for Others;Dressing;Hygiene/Grooming;Lift;Locomotion Level;Self Feeding;Stand;Toileting;Squat;Stairs;Transfers    Examination-Participation Restrictions Medication Management;Driving;Cleaning;Meal Prep;Yard Work;Community Activity;Laundry;Shop;Other    Stability/Clinical Decision Making Unstable/Unpredictable    Rehab Potential Fair    PT Frequency 2x / week    PT Duration 12 weeks    PT Treatment/Interventions ADLs/Self Care Home Management;Biofeedback;Canalith Repostioning;Cryotherapy;Moist Heat;DME Instruction;Gait training;Stair training;Functional mobility training;Therapeutic activities;Therapeutic exercise;Balance training;Neuromuscular re-education;Patient/family education;Orthotic Fit/Training;Wheelchair mobility training;Manual techniques;Passive range of motion;Energy conservation;Splinting;Taping;Vestibular;Visual/perceptual remediation/compensation    PT Next Visit Plan Continue with balance training and progressive LE strengthening.    PT Home Exercise Plan At visit 2, issued VOR training and eyes closed balance. DTR educated on helping. Added some standing LE strengthening and will need to issue handout next visit.    Consulted and Agree with Plan of Care Patient;Family member/caregiver    Family Member Consulted Daughter             Patient will benefit from skilled therapeutic intervention in order to improve the following deficits and impairments:  Abnormal gait, Decreased activity tolerance, Decreased endurance, Decreased strength, Dizziness, Hypomobility, Improper body mechanics,  Cardiopulmonary status limiting activity, Decreased balance, Decreased mobility, Difficulty walking, Impaired vision/preception, Postural dysfunction, Obesity  Visit Diagnosis: Unsteadiness on feet  Other abnormalities of gait and mobility  Muscle weakness (generalized)  Problem List Patient Active Problem List   Diagnosis Date Noted   Acute encephalopathy 04/11/2020   AMS (altered mental status) 04/10/2020   Postural dizziness with near syncope 03/02/2019   Ricard Dillon PT, DPT  02/28/2021, 1:41 PM  [place] MAIN [facility] SERVICES [address] [place], [place], [address] Phone: [phone]   Fax:  [phone]  Name: Erica Donovan MRN: [id number] Date of Birth: [birth year]

## 2021-03-02 ENCOUNTER — Ambulatory Visit: Payer: Medicare Other | Attending: Neurology

## 2021-03-02 ENCOUNTER — Other Ambulatory Visit: Payer: Self-pay

## 2021-03-02 DIAGNOSIS — R2681 Unsteadiness on feet: Secondary | ICD-10-CM | POA: Insufficient documentation

## 2021-03-02 DIAGNOSIS — R278 Other lack of coordination: Secondary | ICD-10-CM | POA: Diagnosis present

## 2021-03-02 DIAGNOSIS — M6281 Muscle weakness (generalized): Secondary | ICD-10-CM | POA: Diagnosis not present

## 2021-03-02 DIAGNOSIS — R2689 Other abnormalities of gait and mobility: Secondary | ICD-10-CM | POA: Diagnosis present

## 2021-03-02 NOTE — Therapy (Signed)
[place] MAIN [facility] SERVICES [address] [place], [place], [address] Phone: [phone]   Fax:  [phone]  Physical Therapy Treatment  Patient Details  Name: Erica Donovan MRN: [id number] Date of Birth: Apr 26, 1926 Referring Provider (PT): Erica Bene, MD   Encounter Date: 03/02/2021   PT End of Session - 03/02/21 1847     Visit Number 6    Number of Visits 25    Date for PT Re-Evaluation 05/03/21    Authorization Type Medicare    Authorization Time Period 02/08/21-05/03/21    PT Start Time 1433    PT Stop Time 1514    PT Time Calculation (min) 41 min    Equipment Utilized During Treatment Gait belt    Activity Tolerance Patient tolerated treatment well;Patient limited by pain;No increased pain    Behavior During Therapy WFL for tasks assessed/performed             Past Medical History:  Diagnosis Date   Arthritis    Cancer ([place])    skin cancer   COPD (chronic obstructive pulmonary disease) (HCC)    mild- no inhalers   Dementia (HCC)    Depression    Diabetes mellitus without complication ([place])    Eye abnormality    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-[facility] keeps check on this   GERD (gastroesophageal reflux disease)    Hemorrhoids    History of kidney stones    Hyperlipidemia    Hypertension    Leaky heart valve    Paroxysmal SVT (supraventricular tachycardia) ([place])     Past Surgical History:  Procedure Laterality Date   ABDOMINAL HYSTERECTOMY     APPENDECTOMY  1945   EYE SURGERY     cataract bil   HEMORRHOID SURGERY     JOINT REPLACEMENT Left 2008   ORIF WRIST FRACTURE Right 11/06/2018   Procedure: OPEN REDUCTION INTERNAL FIXATION (ORIF) WRIST FRACTURE RIGHT - DIABETIC, SLEEP APNEA;  Surgeon: Hessie Knows, MD;  Location: [facility] ORS;  Service: Orthopedics;  Laterality: Right;    There were no vitals filed for this visit.   Subjective Assessment - 03/02/21 1433     Subjective Pt  reports no dizziness. Pt daughter reports pt walked 15 minutes outside yesterday. Pt reports some knee discomfort.    Patient is accompained by: Family member    Pertinent History Pt presents to PT evaluation with her daughter. Pt prefers to go by [name]. Pt has dementia and has some difficulty answering questions; her daughter answers most questions on her behalf. The pt has had dizzy spells on and off for a few years. Worst recent dizzy spell occurred about a month ago where pt had trouble getting out of bed. Pt also with fall d/t dizziness last may that resulted in a brain bleed. Pt has seen neurologist and currently they are unsure if dizziness is d/t crystals or if it is cardiac related. Pt does report she sometimes feels like she is going to pass out. She also reports the room sometimes looks as if it is spinning. She reports worst sx with initially standing up. She is sometimes dizzy when she is not moving. However, it is unclear if she is turning her head when this happens. Pt unsure of duration of sx. Her daughter reports that pt's dizziness has lasted at least an hour before. Pt unsure if she has been nauseated with her sx. Pt does have tinnitus. Pt sometimes has headaches, but report  they are not migraines. Pt unsure of what makes her sx better. She does report she remains still until sx pass. Pt with hx of double vision, is being followed by a doctor d/t bleed behind L eye (still being monitored). Pt has fallen 2x in the last six months. They have been trying exercises at home where pt lays down and turns her head, but this at times makes her sx worse. Other PMH includes afib, HTN, T2DM, HLD, CKD, altered mental status, acute encephalopathy, postural dizziness with near syncope.    Limitations Standing;Walking;House hold activities;Lifting    How long can you sit comfortably? not limited    How long can you stand comfortably? Pt can stand for about 10 minutes    How long can you walk comfortably? can  walk from chair to bathroom with her RW, prior to onset of dizziness could walk up and down her street    Diagnostic tests per chart:  MR brain WO contrast 8/5: No evidence of acute intracranial abnormality. Moderate chronic small vessel ischemic changes within the cerebral   white matter, stable as compared to the brain MRI of 04/11/2020.   Moderate generalized cerebral atrophy. Comparatively mild cerebellar   atrophy. These findings are also stable. Severe mucosal thickening within the right maxillary sinus, progressed.    Patient Stated Goals To make the dizziness go away    Currently in Pain? No/denies    Pain Onset More than a month ago            INTERVENTIONS - CGA provided throughout  Nustep level 0 (seat 7) x 5 min. CGA throughout for technique/UE/LE sequencing, including use of TC/hands-on assist. Min-mod assist for mount/dismount. Cuing for SPM >60. Pt maintains RPM around 58. Rates medium.  STS - 2x12 Continued use of cues for safe technique, upright posture upon standing. Pt was able to self-correct after multiple reps.    Seated marches 20x alternating LEs Seated marches with RTB for resistance 20x alternating LEs  With 2.5lb ankle weights on BLEs pt performed 3x15 of the following: Seated LAQ Seated heel raises Continued use of multi-modal cuing for technique and to attend to exercise.  PT added to HEP, and discussed technique, frequency, sets, reps with pt and pt's daughter (pt's caregiver). Instructed to only have pt perform if daughter is with her. Pt's daughter verbalized understanding.   Access Code: [email or web address] URL: [email or web address] Date: 03/02/2021 Prepared by: Ricard Dillon  Exercises Sit to Stand with Counter Support - 1 x daily - 4 x weekly - 3 sets - 10 reps Seated March - 1 x daily - 4 x weekly - 3 sets - 10 reps   PT Education - 03/02/21 1846     Education Details exercise technique, body mechanics    Person(s) Educated  Patient;Child(ren)   pt's daughter present, is pt's caregiver   Methods Explanation;Demonstration;Tactile cues;Verbal cues;Other (comment)   provided HEP to pt's daughter (caregiver) electronically   Comprehension Verbalized understanding;Returned demonstration;Tactile cues required;Verbal cues required;Need further instruction              PT Short Term Goals - 02/08/21 1628       PT SHORT TERM GOAL #1   Title Pt will be independent with HEP in order to improve strength and balance in order to decrease fall risk and improve function at home and work.    Baseline 8/10: to be initiated    Time 6    Period Weeks    Status New  Target Date 03/22/21      PT SHORT TERM GOAL #2   Title --      PT SHORT TERM GOAL #3   Title --      PT SHORT TERM GOAL #4   Title --      PT SHORT TERM GOAL #5   Title --               PT Long Term Goals - 02/09/21 0855       PT LONG TERM GOAL #1   Title Patient will increase FOTO score to equal to or greater than 50 to demonstrate statistically significant improvement in mobility and quality of life.    Baseline 8/10: 43    Time 12    Period Weeks    Status New    Target Date 05/03/21      PT LONG TERM GOAL #2   Title Pt will decrease DHI score by at least 18 points in order to demonstrate clinically significant reduction in disability    Baseline 8/10: 54    Time 12    Status New    Target Date 05/03/21      PT LONG TERM GOAL #3   Title Pt will improve ABC by at least 13% in order to demonstrate clinically significant improvement in balance confidence.    Baseline 8/10: deferred    Time 40    Period Weeks    Status New    Target Date 05/03/21      PT LONG TERM GOAL #4   Title Pt will decrease TUG to below 14 seconds/decrease in order to demonstrate decreased fall risk.    Baseline 8/10: deferred    Time 52    Period Weeks    Status New    Target Date 05/03/21                   Plan - 03/02/21 1853      Clinical Impression Statement Pt with further improvement with no reports of dizziness since previous session and no reports of dizziness during session. Focus was on improving LE strength and endurance to promote safety with functional mobility. Pt requires multi-modal cuing throughout to attend to task and to perform interventions with safe and correct technique. PT issued addition to HEP to address deficits in LE strength/endurance (see note). The pt will benefit from further skilled PT to improve mobility, LE strength and endurance, and balance to decrease fall risk.    Personal Factors and Comorbidities Age;Comorbidity 1;Comorbidity 3+;Comorbidity 2;Sex;Time since onset of injury/illness/exacerbation;Fitness    Comorbidities per pt and daughter pt has dementia, other PMH of afib, HTN, T2DM, HLD, CKD, altered mental status, acute encephalopathy, postural dizziness with near syncope    Examination-Activity Limitations Bathing;Bed Mobility;Bend;Caring for Others;Dressing;Hygiene/Grooming;Lift;Locomotion Level;Self Feeding;Stand;Toileting;Squat;Stairs;Transfers    Examination-Participation Restrictions Medication Management;Driving;Cleaning;Meal Prep;Yard Work;Community Activity;Laundry;Shop;Other    Stability/Clinical Decision Making Unstable/Unpredictable    Rehab Potential Fair    PT Frequency 2x / week    PT Duration 12 weeks    PT Treatment/Interventions ADLs/Self Care Home Management;Biofeedback;Canalith Repostioning;Cryotherapy;Moist Heat;DME Instruction;Gait training;Stair training;Functional mobility training;Therapeutic activities;Therapeutic exercise;Balance training;Neuromuscular re-education;Patient/family education;Orthotic Fit/Training;Wheelchair mobility training;Manual techniques;Passive range of motion;Energy conservation;Splinting;Taping;Vestibular;Visual/perceptual remediation/compensation    PT Next Visit Plan Continue with balance training and progressive LE strengthening.    PT  Home Exercise Plan At visit 2, issued VOR training and eyes closed balance. DTR educated on helping. Added some standing LE strengthening and will need to issue handout next visit. 9/1:  Access Code: [email or web address]    Consulted and Agree with Plan of Care Patient;Family member/caregiver    Family Member Consulted Daughter             Patient will benefit from skilled therapeutic intervention in order to improve the following deficits and impairments:  Abnormal gait, Decreased activity tolerance, Decreased endurance, Decreased strength, Dizziness, Hypomobility, Improper body mechanics, Cardiopulmonary status limiting activity, Decreased balance, Decreased mobility, Difficulty walking, Impaired vision/preception, Postural dysfunction, Obesity  Visit Diagnosis: Muscle weakness (generalized)  Other abnormalities of gait and mobility  Unsteadiness on feet  Other lack of coordination     Problem List Patient Active Problem List   Diagnosis Date Noted   Acute encephalopathy 04/11/2020   AMS (altered mental status) 04/10/2020   Postural dizziness with near syncope 03/02/2019   Ricard Dillon PT, DPT 03/02/2021, 6:57 PM  [place] [address] [place], [place], [address] Phone: [phone]   Fax:  [phone]  Name: Erica Donovan MRN: [id number] Date of Birth: July 29, 1925

## 2021-03-07 ENCOUNTER — Other Ambulatory Visit: Payer: Self-pay

## 2021-03-07 ENCOUNTER — Ambulatory Visit: Payer: Medicare Other

## 2021-03-07 DIAGNOSIS — M6281 Muscle weakness (generalized): Secondary | ICD-10-CM

## 2021-03-07 DIAGNOSIS — R278 Other lack of coordination: Secondary | ICD-10-CM

## 2021-03-07 DIAGNOSIS — R2689 Other abnormalities of gait and mobility: Secondary | ICD-10-CM

## 2021-03-07 DIAGNOSIS — R2681 Unsteadiness on feet: Secondary | ICD-10-CM

## 2021-03-07 NOTE — Therapy (Signed)
[place] MAIN [facility] SERVICES [address] [place], [place], [address] Phone: [phone]   Fax:  [phone]  Physical Therapy Treatment  Patient Details  Name: Erica Donovan MRN: [id number] Date of Birth: 01-Jan-1926 Referring Provider (PT): Anabel Bene, MD   Encounter Date: 03/07/2021   PT End of Session - 03/07/21 1308     Visit Number 7    Number of Visits 25    Date for PT Re-Evaluation 05/03/21    Authorization Type Medicare    Authorization Time Period 02/08/21-05/03/21    PT Start Time 1149    PT Stop Time 1233    PT Time Calculation (min) 44 min    Equipment Utilized During Treatment Gait belt    Activity Tolerance Patient tolerated treatment well;Patient limited by pain    Behavior During Therapy WFL for tasks assessed/performed             Past Medical History:  Diagnosis Date   Arthritis    Cancer ([place])    skin cancer   COPD (chronic obstructive pulmonary disease) ([place])    mild- no inhalers   Dementia ([place])    Depression    Diabetes mellitus without complication ([place])    Eye abnormality    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-[facility] keeps check on this   GERD (gastroesophageal reflux disease)    Hemorrhoids    History of kidney stones    Hyperlipidemia    Hypertension    Leaky heart valve    Paroxysmal SVT (supraventricular tachycardia) ([place])     Past Surgical History:  Procedure Laterality Date   ABDOMINAL HYSTERECTOMY     APPENDECTOMY  1945   EYE SURGERY     cataract bil   HEMORRHOID SURGERY     JOINT REPLACEMENT Left 2008   ORIF WRIST FRACTURE Right 11/06/2018   Procedure: OPEN REDUCTION INTERNAL FIXATION (ORIF) WRIST FRACTURE RIGHT - DIABETIC, SLEEP APNEA;  Surgeon: Hessie Knows, MD;  Location: [facility] ORS;  Service: Orthopedics;  Laterality: Right;    There were no vitals filed for this visit.   Subjective Assessment - 03/07/21 1151     Subjective Pt reports no dizziness.  Denies falls or near-falls.  She reports usual knee pain.    Patient is accompained by: Family member    Pertinent History Pt presents to PT evaluation with her daughter. Pt prefers to go by [name]. Pt has dementia and has some difficulty answering questions; her daughter answers most questions on her behalf. The pt has had dizzy spells on and off for a few years. Worst recent dizzy spell occurred about a month ago where pt had trouble getting out of bed. Pt also with fall d/t dizziness last may that resulted in a brain bleed. Pt has seen neurologist and currently they are unsure if dizziness is d/t crystals or if it is cardiac related. Pt does report she sometimes feels like she is going to pass out. She also reports the room sometimes looks as if it is spinning. She reports worst sx with initially standing up. She is sometimes dizzy when she is not moving. However, it is unclear if she is turning her head when this happens. Pt unsure of duration of sx. Her daughter reports that pt's dizziness has lasted at least an hour before. Pt unsure if she has been nauseated with her sx. Pt does have tinnitus. Pt sometimes has headaches, but report they are not migraines. Pt unsure  of what makes her sx better. She does report she remains still until sx pass. Pt with hx of double vision, is being followed by a doctor d/t bleed behind L eye (still being monitored). Pt has fallen 2x in the last six months. They have been trying exercises at home where pt lays down and turns her head, but this at times makes her sx worse. Other PMH includes afib, HTN, T2DM, HLD, CKD, altered mental status, acute encephalopathy, postural dizziness with near syncope.    Limitations Standing;Walking;House hold activities;Lifting    How long can you sit comfortably? not limited    How long can you stand comfortably? Pt can stand for about 10 minutes    How long can you walk comfortably? can walk from chair to bathroom with her RW, prior to onset  of dizziness could walk up and down her street    Diagnostic tests per chart:  MR brain WO contrast 8/5: No evidence of acute intracranial abnormality. Moderate chronic small vessel ischemic changes within the cerebral   white matter, stable as compared to the brain MRI of 04/11/2020.   Moderate generalized cerebral atrophy. Comparatively mild cerebellar   atrophy. These findings are also stable. Severe mucosal thickening within the right maxillary sinus, progressed.    Patient Stated Goals To make the dizziness go away    Currently in Pain? Yes    Pain Location Knee    Pain Onset More than a month ago             INTERVENTIONS - CGA-min a provided throughout   Nustep level 1 x3 min, level 0 (seat 9) x 2 min. CGA throughout for technique/UE/LE sequencing, including use of TC/hands-on assist for pt to continue with intervention. Min-mod assist for mount/dismount. Cuing for SPM >60, pt performs with SPM 50s-70s. Reports fatigue. Rates level 1 difficult, level 0 medium.   STS - 1x10, 1x12; pt reports easy, continued cuing for safe technique  Neuro Re-Ed: in // bars Semi tandem 4x30 sec BLEs; intermittent UE support. Frequent cuing for positioning/correct technique.  Standing marches for SLB progression - 10x, 20x alternating Les, no UE support, decreased AROM B d/t decreased hip flexor strength.  SLB 4x20-30 sec each LE; discontinued because of knee pain. Frequent cues provided throughout including demo, for correct technique with exercises.   Seated Therex- Pt wearing 3lb ankle weights on BLEs while performing the following: Seated LAQ 2x20; pt performs with ease Seated heel raises 2x20; left leg tremulous, unable to achieve full ROM on L Continued use of multi-modal cuing for technique and to attend to exercise.   Pt educated throughout session about proper posture and technique with exercises. Improved exercise technique, movement at target joints, use of target muscles after min to  mod verbal, visual, tactile cues.    PT Education - 03/07/21 1307     Education Details exercise technique, body mechanics, continued VC/TC for safe transfer technique    Person(s) Educated Patient    Methods Explanation;Demonstration;Tactile cues;Verbal cues    Comprehension Verbalized understanding;Returned demonstration;Need further instruction;Verbal cues required;Tactile cues required              PT Short Term Goals - 02/08/21 1628       PT SHORT TERM GOAL #1   Title Pt will be independent with HEP in order to improve strength and balance in order to decrease fall risk and improve function at home and work.    Baseline 8/10: to be initiated  Time 6    Period Weeks    Status New    Target Date 03/22/21      PT SHORT TERM GOAL #2   Title --      PT SHORT TERM GOAL #3   Title --      PT SHORT TERM GOAL #4   Title --      PT SHORT TERM GOAL #5   Title --               PT Long Term Goals - 02/09/21 0855       PT LONG TERM GOAL #1   Title Patient will increase FOTO score to equal to or greater than 50 to demonstrate statistically significant improvement in mobility and quality of life.    Baseline 8/10: 43    Time 12    Period Weeks    Status New    Target Date 05/03/21      PT LONG TERM GOAL #2   Title Pt will decrease DHI score by at least 18 points in order to demonstrate clinically significant reduction in disability    Baseline 8/10: 54    Time 12    Status New    Target Date 05/03/21      PT LONG TERM GOAL #3   Title Pt will improve ABC by at least 13% in order to demonstrate clinically significant improvement in balance confidence.    Baseline 8/10: deferred    Time 8    Period Weeks    Status New    Target Date 05/03/21      PT LONG TERM GOAL #4   Title Pt will decrease TUG to below 14 seconds/decrease in order to demonstrate decreased fall risk.    Baseline 8/10: deferred    Time 47    Period Weeks    Status New    Target Date  05/03/21                   Plan - 03/07/21 1317     Clinical Impression Statement Pt progresses with volume and level of weight/resistance used with therex this session, indicating improved LE strength and activity tolerance. While pt does show improvement she is still challenged with all balance exercises, and does fatigue quickly with nustep. The pt will benefit from further skilled PT to continue to improve LE strength, endurance, gait and balance to decrease fall risk.    Personal Factors and Comorbidities Age;Comorbidity 1;Comorbidity 3+;Comorbidity 2;Sex;Time since onset of injury/illness/exacerbation;Fitness    Comorbidities per pt and daughter pt has dementia, other PMH of afib, HTN, T2DM, HLD, CKD, altered mental status, acute encephalopathy, postural dizziness with near syncope    Examination-Activity Limitations Bathing;Bed Mobility;Bend;Caring for Others;Dressing;Hygiene/Grooming;Lift;Locomotion Level;Self Feeding;Stand;Toileting;Squat;Stairs;Transfers    Examination-Participation Restrictions Medication Management;Driving;Cleaning;Meal Prep;Yard Work;Community Activity;Laundry;Shop;Other    Stability/Clinical Decision Making Unstable/Unpredictable    Rehab Potential Fair    PT Frequency 2x / week    PT Duration 12 weeks    PT Treatment/Interventions ADLs/Self Care Home Management;Biofeedback;Canalith Repostioning;Cryotherapy;Moist Heat;DME Instruction;Gait training;Stair training;Functional mobility training;Therapeutic activities;Therapeutic exercise;Balance training;Neuromuscular re-education;Patient/family education;Orthotic Fit/Training;Wheelchair mobility training;Manual techniques;Passive range of motion;Energy conservation;Splinting;Taping;Vestibular;Visual/perceptual remediation/compensation    PT Next Visit Plan Continue with balance training and progressive LE strengthening.    PT Home Exercise Plan At visit 2, issued VOR training and eyes closed balance. DTR educated  on helping. Added some standing LE strengthening and will need to issue handout next visit. 9/1: Access Code: [email or web address] and Agree  with Plan of Care Patient;Family member/caregiver    Family Member Consulted Daughter             Patient will benefit from skilled therapeutic intervention in order to improve the following deficits and impairments:  Abnormal gait, Decreased activity tolerance, Decreased endurance, Decreased strength, Dizziness, Hypomobility, Improper body mechanics, Cardiopulmonary status limiting activity, Decreased balance, Decreased mobility, Difficulty walking, Impaired vision/preception, Postural dysfunction, Obesity  Visit Diagnosis: Unsteadiness on feet  Other abnormalities of gait and mobility  Other lack of coordination  Muscle weakness (generalized)     Problem List Patient Active Problem List   Diagnosis Date Noted   Acute encephalopathy 04/11/2020   AMS (altered mental status) 04/10/2020   Postural dizziness with near syncope 03/02/2019   Ricard Dillon PT, DPT  03/07/2021, 1:18 PM  [place] [address] [place], [place], [address] Phone: [phone]   Fax:  [phone]  Name: Erica Donovan MRN: [id number] Date of Birth: [birth year]

## 2021-03-09 ENCOUNTER — Ambulatory Visit: Payer: Medicare Other

## 2021-03-09 ENCOUNTER — Other Ambulatory Visit: Payer: Self-pay

## 2021-03-09 DIAGNOSIS — R2689 Other abnormalities of gait and mobility: Secondary | ICD-10-CM

## 2021-03-09 DIAGNOSIS — R2681 Unsteadiness on feet: Secondary | ICD-10-CM

## 2021-03-09 DIAGNOSIS — M6281 Muscle weakness (generalized): Secondary | ICD-10-CM

## 2021-03-09 NOTE — Therapy (Signed)
[place] MAIN [facility] SERVICES [address] [place], [place], [address] Phone: [phone]   Fax:  [phone]  Physical Therapy Treatment  Patient Details  Name: Erica Donovan MRN: [id number] Date of Birth: 1926-01-05 Referring Provider (PT): Anabel Bene, MD   Encounter Date: 03/09/2021   PT End of Session - 03/09/21 1651     Visit Number 8    Number of Visits 25    Date for PT Re-Evaluation 05/03/21    Authorization Type Medicare    Authorization Time Period 02/08/21-05/03/21    PT Start Time 1559    PT Stop Time 1642    PT Time Calculation (min) 43 min    Equipment Utilized During Treatment Gait belt    Activity Tolerance Patient tolerated treatment well    Behavior During Therapy WFL for tasks assessed/performed             Past Medical History:  Diagnosis Date   Arthritis    Cancer ([place])    skin cancer   COPD (chronic obstructive pulmonary disease) ([place])    mild- no inhalers   Dementia ([place])    Depression    Diabetes mellitus without complication ([place])    Eye abnormality    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-[facility] keeps check on this   GERD (gastroesophageal reflux disease)    Hemorrhoids    History of kidney stones    Hyperlipidemia    Hypertension    Leaky heart valve    Paroxysmal SVT (supraventricular tachycardia) ([place])     Past Surgical History:  Procedure Laterality Date   ABDOMINAL HYSTERECTOMY     APPENDECTOMY  1945   EYE SURGERY     cataract bil   HEMORRHOID SURGERY     JOINT REPLACEMENT Left 2008   ORIF WRIST FRACTURE Right 11/06/2018   Procedure: OPEN REDUCTION INTERNAL FIXATION (ORIF) WRIST FRACTURE RIGHT - DIABETIC, SLEEP APNEA;  Surgeon: Hessie Knows, MD;  Location: [facility] ORS;  Service: Orthopedics;  Laterality: Right;    There were no vitals filed for this visit.   Subjective Assessment - 03/09/21 1558     Subjective Pt reports no dizziness since last appointment.   Patient has been performing HEP.  Reports no falls or near falls.    Patient is accompained by: Family member    Pertinent History Pt presents to PT evaluation with her daughter. Pt prefers to go by Erica Donovan. Pt has dementia and has some difficulty answering questions; her daughter answers most questions on her behalf. The pt has had dizzy spells on and off for a few years. Worst recent dizzy spell occurred about a month ago where pt had trouble getting out of bed. Pt also with fall d/t dizziness last may that resulted in a brain bleed. Pt has seen neurologist and currently they are unsure if dizziness is d/t crystals or if it is cardiac related. Pt does report she sometimes feels like she is going to pass out. She also reports the room sometimes looks as if it is spinning. She reports worst sx with initially standing up. She is sometimes dizzy when she is not moving. However, it is unclear if she is turning her head when this happens. Pt unsure of duration of sx. Her daughter reports that pt's dizziness has lasted at least an hour before. Pt unsure if she has been nauseated with her sx. Pt does have tinnitus. Pt sometimes has headaches, but report they are not  migraines. Pt unsure of what makes her sx better. She does report she remains still until sx pass. Pt with hx of double vision, is being followed by a doctor d/t bleed behind L eye (still being monitored). Pt has fallen 2x in the last six months. They have been trying exercises at home where pt lays down and turns her head, but this at times makes her sx worse. Other PMH includes afib, HTN, T2DM, HLD, CKD, altered mental status, acute encephalopathy, postural dizziness with near syncope.    Limitations Standing;Walking;House hold activities;Lifting    How long can you sit comfortably? not limited    How long can you stand comfortably? Pt can stand for about 10 minutes    How long can you walk comfortably? can walk from chair to bathroom with her RW, prior  to onset of dizziness could walk up and down her street    Diagnostic tests per chart:  MR brain WO contrast 8/5: No evidence of acute intracranial abnormality. Moderate chronic small vessel ischemic changes within the cerebral   white matter, stable as compared to the brain MRI of 04/11/2020.   Moderate generalized cerebral atrophy. Comparatively mild cerebellar   atrophy. These findings are also stable. Severe mucosal thickening within the right maxillary sinus, progressed.    Patient Stated Goals To make the dizziness go away    Currently in Pain? Yes    Pain Location Knee    Pain Onset More than a month ago            INTERVENTIONS - CGA-min a provided throughout   Nustep level 2 x2 min, level 1 (seat 9) x3 min.  Minor cueing for technique/UE/LE sequencing, including use of TC/hands-on assist for pt to initiate intervention. Min assist for mount/dismount. Cuing for SPM >60, pt performs with SPM 50s-70s. Reports fatigue at one minute and 30 seconds.  Rates level 2 as difficult. Had to decrease to level 1 at 2 minutes.   Ambulation for LE endurance for approx 160 ft with rolling walker and with contact-guard assist. Pt reports fatigue.   STS -1 x 10 with use of upper extremities, 4 reps without upper extremity assist, followed by 6 remaining reps with upper extremity assist. PT educates both patient and daughter with sit to stand technique including trunk positioning and lower extremity positioning.  Obstacle course: Patient ambulates with rolling walker navigating between and around cones and with stepping over obstacle.  Patient knocks over cones 3 times.  Requires multiple cues to step over box instead of circumducting around box.  After cueing patient was able to clear box.  4 times through obstacle course  At support surface with contact-guard assist to mod assist throughout: Semitandem stance x multiple bouts of 20 to 30 seconds each lower extremity; patient must use intermittent  upper extremity support throughout to regain balance.  Single-leg balance x several bouts of 30 to 60 seconds each lower extremities; patient must use at least 3 finger support on bar to maintain balance and prevent falls.  Patient did exhibit right lower extremity knee buckling and required up to mod assist from PT to regain balance and prevent fall.   Pt educated throughout session about proper posture and technique with exercises. Improved exercise technique, movement at target joints, use of target muscles after min to mod verbal, visual, tactile cues.   Note: Portions of this document were prepared using Dragon voice recognition software and although reviewed may contain unintentional dictation errors in syntax, grammar,  or spelling.     PT Education - 03/09/21 1651     Education Details Exercise technique, body mechanics with sit to stands without using upper extremity support    Person(s) Educated Patient;Child(ren)    Methods Explanation;Demonstration;Tactile cues;Verbal cues    Comprehension Verbalized understanding;Returned demonstration;Verbal cues required;Tactile cues required;Need further instruction              PT Short Term Goals - 02/08/21 1628       PT SHORT TERM GOAL #1   Title Pt will be independent with HEP in order to improve strength and balance in order to decrease fall risk and improve function at home and work.    Baseline 8/10: to be initiated    Time 6    Period Weeks    Status New    Target Date 03/22/21      PT SHORT TERM GOAL #2   Title --      PT SHORT TERM GOAL #3   Title --      PT SHORT TERM GOAL #4   Title --      PT SHORT TERM GOAL #5   Title --               PT Long Term Goals - 02/09/21 0855       PT LONG TERM GOAL #1   Title Patient will increase FOTO score to equal to or greater than 50 to demonstrate statistically significant improvement in mobility and quality of life.    Baseline 8/10: 43    Time 12    Period  Weeks    Status New    Target Date 05/03/21      PT LONG TERM GOAL #2   Title Pt will decrease DHI score by at least 18 points in order to demonstrate clinically significant reduction in disability    Baseline 8/10: 54    Time 12    Status New    Target Date 05/03/21      PT LONG TERM GOAL #3   Title Pt will improve ABC by at least 13% in order to demonstrate clinically significant improvement in balance confidence.    Baseline 8/10: deferred    Time 51    Period Weeks    Status New    Target Date 05/03/21      PT LONG TERM GOAL #4   Title Pt will decrease TUG to below 14 seconds/decrease in order to demonstrate decreased fall risk.    Baseline 8/10: deferred    Time 73    Period Weeks    Status New    Target Date 05/03/21                   Plan - 03/09/21 1652     Clinical Impression Statement Patient continues to make gains in physical therapy session by performing sit to stands hands-free for 4 reps, indicating increased bilateral lower extremity strength.  While patient shows improvement, patient is still challenged with scanning environment for obstacles while ambulating and with static balance tasks.  The patient will benefit from further skilled PT to continue to improve strength, endurance, balance, and gait to decrease fall risk.    Personal Factors and Comorbidities Age;Comorbidity 1;Comorbidity 3+;Comorbidity 2;Sex;Time since onset of injury/illness/exacerbation;Fitness    Comorbidities per pt and daughter pt has dementia, other PMH of afib, HTN, T2DM, HLD, CKD, altered mental status, acute encephalopathy, postural dizziness with near syncope    Examination-Activity Limitations Bathing;Bed  Mobility;Bend;Caring for Others;Dressing;Hygiene/Grooming;Lift;Locomotion Level;Self Feeding;Stand;Toileting;Squat;Stairs;Transfers    Examination-Participation Restrictions Medication Management;Driving;Cleaning;Meal Prep;Yard Work;Community Activity;Laundry;Shop;Other     Stability/Clinical Decision Making Unstable/Unpredictable    Rehab Potential Fair    PT Frequency 2x / week    PT Duration 12 weeks    PT Treatment/Interventions ADLs/Self Care Home Management;Biofeedback;Canalith Repostioning;Cryotherapy;Moist Heat;DME Instruction;Gait training;Stair training;Functional mobility training;Therapeutic activities;Therapeutic exercise;Balance training;Neuromuscular re-education;Patient/family education;Orthotic Fit/Training;Wheelchair mobility training;Manual techniques;Passive range of motion;Energy conservation;Splinting;Taping;Vestibular;Visual/perceptual remediation/compensation    PT Next Visit Plan Continue with balance training and progressive LE strengthening, endurance training.    PT Home Exercise Plan At visit 2, issued VOR training and eyes closed balance. DTR educated on helping. Added some standing LE strengthening and will need to issue handout next visit. 9/1: Access Code: [email or web address]    Consulted and Agree with Plan of Care Patient;Family member/caregiver    Family Member Consulted Daughter             Patient will benefit from skilled therapeutic intervention in order to improve the following deficits and impairments:  Abnormal gait, Decreased activity tolerance, Decreased endurance, Decreased strength, Dizziness, Hypomobility, Improper body mechanics, Cardiopulmonary status limiting activity, Decreased balance, Decreased mobility, Difficulty walking, Impaired vision/preception, Postural dysfunction, Obesity  Visit Diagnosis: Other abnormalities of gait and mobility  Unsteadiness on feet  Muscle weakness (generalized)     Problem List Patient Active Problem List   Diagnosis Date Noted   Acute encephalopathy 04/11/2020   AMS (altered mental status) 04/10/2020   Postural dizziness with near syncope 03/02/2019    Zollie Pee, PT 03/09/2021, 5:01 PM  [place] [address]  [place], [place], [address] Phone: [phone]   Fax:  [phone]  Name: Erica Donovan MRN: [id number] Date of Birth: 11-23-1925

## 2021-03-13 ENCOUNTER — Ambulatory Visit: Payer: Medicare Other

## 2021-03-13 ENCOUNTER — Other Ambulatory Visit: Payer: Self-pay

## 2021-03-13 DIAGNOSIS — R2689 Other abnormalities of gait and mobility: Secondary | ICD-10-CM

## 2021-03-13 DIAGNOSIS — M6281 Muscle weakness (generalized): Secondary | ICD-10-CM

## 2021-03-13 DIAGNOSIS — R278 Other lack of coordination: Secondary | ICD-10-CM

## 2021-03-13 DIAGNOSIS — R2681 Unsteadiness on feet: Secondary | ICD-10-CM

## 2021-03-13 NOTE — Therapy (Signed)
[place] MAIN [facility] SERVICES [address] [place], [place], [address] Phone: [phone]   Fax:  [phone]  Physical Therapy Treatment  Patient Details  Name: Erica Donovan MRN: [id number] Date of Birth: 12-01-1925 Referring Provider (PT): Erica Bene, MD   Encounter Date: 03/13/2021   PT End of Session - 03/13/21 1716     Visit Number 9    Number of Visits 25    Date for PT Re-Evaluation 05/03/21    Authorization Type Medicare    Authorization Time Period 02/08/21-05/03/21    PT Start Time 1434    PT Stop Time 1514    PT Time Calculation (min) 40 min    Equipment Utilized During Treatment Gait belt    Activity Tolerance Patient tolerated treatment well    Behavior During Therapy WFL for tasks assessed/performed             Past Medical History:  Diagnosis Date   Arthritis    Cancer ([place])    skin cancer   COPD (chronic obstructive pulmonary disease) ([place])    mild- no inhalers   Dementia ([place])    Depression    Diabetes mellitus without complication ([place])    Eye abnormality    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-[facility] keeps check on this   GERD (gastroesophageal reflux disease)    Hemorrhoids    History of kidney stones    Hyperlipidemia    Hypertension    Leaky heart valve    Paroxysmal SVT (supraventricular tachycardia) ([place])     Past Surgical History:  Procedure Laterality Date   ABDOMINAL HYSTERECTOMY     APPENDECTOMY  1945   EYE SURGERY     cataract bil   HEMORRHOID SURGERY     JOINT REPLACEMENT Left 2008   ORIF WRIST FRACTURE Right 11/06/2018   Procedure: OPEN REDUCTION INTERNAL FIXATION (ORIF) WRIST FRACTURE RIGHT - DIABETIC, SLEEP APNEA;  Surgeon: Erica Knows, MD;  Location: [facility] ORS;  Service: Orthopedics;  Laterality: Right;    There were no vitals filed for this visit.   Subjective Assessment - 03/13/21 1447     Subjective Pt reports no dizziness, no falls or near-falls  since last visit. Pt daughter reports pt has been doing some HEP.    Patient is accompained by: Family member    Pertinent History Pt presents to PT evaluation with her daughter. Pt prefers to go by [name]. Pt has dementia and has some difficulty answering questions; her daughter answers most questions on her behalf. The pt has had dizzy spells on and off for a few years. Worst recent dizzy spell occurred about a month ago where pt had trouble getting out of bed. Pt also with fall d/t dizziness last may that resulted in a brain bleed. Pt has seen neurologist and currently they are unsure if dizziness is d/t crystals or if it is cardiac related. Pt does report she sometimes feels like she is going to pass out. She also reports the room sometimes looks as if it is spinning. She reports worst sx with initially standing up. She is sometimes dizzy when she is not moving. However, it is unclear if she is turning her head when this happens. Pt unsure of duration of sx. Her daughter reports that pt's dizziness has lasted at least an hour before. Pt unsure if she has been nauseated with her sx. Pt does have tinnitus. Pt sometimes has headaches, but report they are not  migraines. Pt unsure of what makes her sx better. She does report she remains still until sx pass. Pt with hx of double vision, is being followed by a doctor d/t bleed behind L eye (still being monitored). Pt has fallen 2x in the last six months. They have been trying exercises at home where pt lays down and turns her head, but this at times makes her sx worse. Other PMH includes afib, HTN, T2DM, HLD, CKD, altered mental status, acute encephalopathy, postural dizziness with near syncope.    Limitations Standing;Walking;House hold activities;Lifting    How long can you sit comfortably? not limited    How long can you stand comfortably? Pt can stand for about 10 minutes    How long can you walk comfortably? can walk from chair to bathroom with her RW, prior  to onset of dizziness could walk up and down her street    Diagnostic tests per chart:  MR brain WO contrast 8/5: No evidence of acute intracranial abnormality. Moderate chronic small vessel ischemic changes within the cerebral   white matter, stable as compared to the brain MRI of 04/11/2020.   Moderate generalized cerebral atrophy. Comparatively mild cerebellar   atrophy. These findings are also stable. Severe mucosal thickening within the right maxillary sinus, progressed.    Patient Stated Goals To make the dizziness go away    Currently in Pain? Yes    Pain Location Knee    Pain Orientation Right    Pain Onset More than a month ago               INTERVENTIONS - CGA-min a provided throughout  Patient daughter reports patient's BP was low prior to PT session  In session - BP LUE, seated: 132/55 79 bpm    Nustep level 1 x3 min, level 2 (seat 9) x3 min.  Continued minor cueing for technique/UE/LE sequencing, including use of TC/hands-on assist for pt to initiate intervention. Min assist for mount/dismount. Cuing for SPM >60, pt performs with SPM in 50s-60s. Pt fatigued with exercise.    Ambulation for LE endurance for approx 2x160 ft with rolling walker and with contact-guard assist.  PT cues patient for proximity to walker and increased step length bilaterally.   PT continues to instruct patient in hands-free sit to stand technique and safety technique with stand to sit: Patient able to perform 6 sit to stands hands-free.  Patient then performs 5 sit to stands with use of upper extremities to assist  Patient seated wearing 4 pound ankle weights on bilateral lower extremities: Long arc quads-2 x 20; frequent cues to attend to task and to perform with correct technique Seated marches 2 x 20.  PT provides demonstration and verbal cues for patient to improve technique.    Pt educated throughout session about proper posture and technique with exercises. Improved exercise technique,  movement at target joints, use of target muscles after min to mod verbal, visual, tactile cues.   Note: Portions of this document were prepared using Dragon voice recognition software and although reviewed may contain unintentional dictation errors in syntax, grammar, or spelling.     PT Education - 03/13/21 1716     Education Details Continued education on safe technique with stand to sit    Person(s) Educated Patient    Methods Explanation;Demonstration;Tactile cues;Verbal cues    Comprehension Verbalized understanding;Returned demonstration;Verbal cues required;Tactile cues required;Need further instruction              PT Short Term  Goals - 02/08/21 1628       PT SHORT TERM GOAL #1   Title Pt will be independent with HEP in order to improve strength and balance in order to decrease fall risk and improve function at home and work.    Baseline 8/10: to be initiated    Time 6    Period Weeks    Status New    Target Date 03/22/21      PT SHORT TERM GOAL #2   Title --      PT SHORT TERM GOAL #3   Title --      PT SHORT TERM GOAL #4   Title --      PT SHORT TERM GOAL #5   Title --               PT Long Term Goals - 02/09/21 0855       PT LONG TERM GOAL #1   Title Patient will increase FOTO score to equal to or greater than 50 to demonstrate statistically significant improvement in mobility and quality of life.    Baseline 8/10: 43    Time 12    Period Weeks    Status New    Target Date 05/03/21      PT LONG TERM GOAL #2   Title Pt will decrease DHI score by at least 18 points in order to demonstrate clinically significant reduction in disability    Baseline 8/10: 54    Time 12    Status New    Target Date 05/03/21      PT LONG TERM GOAL #3   Title Pt will improve ABC by at least 13% in order to demonstrate clinically significant improvement in balance confidence.    Baseline 8/10: deferred    Time 74    Period Weeks    Status New    Target Date  05/03/21      PT LONG TERM GOAL #4   Title Pt will decrease TUG to below 14 seconds/decrease in order to demonstrate decreased fall risk.    Baseline 8/10: deferred    Time 14    Period Weeks    Status New    Target Date 05/03/21                   Plan - 03/13/21 1717     Clinical Impression Statement Patient continues to exhibit improved lower extremity strength performing 6 sit to stands hands-free this session.  Patient demonstrates progress she still requires frequent cues for safe stand to sit technique.  Her active tolerance is still somewhat limited by fatigue.  The patient will benefit from further skilled PT to continue to improve strength, endurance and balance to decrease fall risk    Personal Factors and Comorbidities Age;Comorbidity 1;Comorbidity 3+;Comorbidity 2;Sex;Time since onset of injury/illness/exacerbation;Fitness    Comorbidities per pt and daughter pt has dementia, other PMH of afib, HTN, T2DM, HLD, CKD, altered mental status, acute encephalopathy, postural dizziness with near syncope    Examination-Activity Limitations Bathing;Bed Mobility;Bend;Caring for Others;Dressing;Hygiene/Grooming;Lift;Locomotion Level;Self Feeding;Stand;Toileting;Squat;Stairs;Transfers    Examination-Participation Restrictions Medication Management;Driving;Cleaning;Meal Prep;Yard Work;Community Activity;Laundry;Shop;Other    Stability/Clinical Decision Making Unstable/Unpredictable    Rehab Potential Fair    PT Frequency 2x / week    PT Duration 12 weeks    PT Treatment/Interventions ADLs/Self Care Home Management;Biofeedback;Canalith Repostioning;Cryotherapy;Moist Heat;DME Instruction;Gait training;Stair training;Functional mobility training;Therapeutic activities;Therapeutic exercise;Balance training;Neuromuscular re-education;Patient/family education;Orthotic Fit/Training;Wheelchair mobility training;Manual techniques;Passive range of motion;Energy  conservation;Splinting;Taping;Vestibular;Visual/perceptual remediation/compensation  PT Next Visit Plan Continue with balance training and progressive LE strengthening, endurance training.    PT Home Exercise Plan At visit 2, issued VOR training and eyes closed balance. DTR educated on helping. Added some standing LE strengthening and will need to issue handout next visit. 9/1: Access Code: [email or web address]    Consulted and Agree with Plan of Care Patient;Family member/caregiver    Family Member Consulted Daughter             Patient will benefit from skilled therapeutic intervention in order to improve the following deficits and impairments:  Abnormal gait, Decreased activity tolerance, Decreased endurance, Decreased strength, Dizziness, Hypomobility, Improper body mechanics, Cardiopulmonary status limiting activity, Decreased balance, Decreased mobility, Difficulty walking, Impaired vision/preception, Postural dysfunction, Obesity  Visit Diagnosis: Other abnormalities of gait and mobility  Muscle weakness (generalized)  Unsteadiness on feet  Other lack of coordination     Problem List Patient Active Problem List   Diagnosis Date Noted   Acute encephalopathy 04/11/2020   AMS (altered mental status) 04/10/2020   Postural dizziness with near syncope 03/02/2019    Zollie Pee, PT 03/13/2021, 5:19 PM  [place] [address] [place], [place], [address] Phone: [phone]   Fax:  [phone]  Name: Erica Donovan MRN: [id number] Date of Birth: 07-30-1925

## 2021-03-16 ENCOUNTER — Ambulatory Visit: Payer: Medicare Other

## 2021-03-16 ENCOUNTER — Other Ambulatory Visit: Payer: Self-pay

## 2021-03-16 DIAGNOSIS — R2681 Unsteadiness on feet: Secondary | ICD-10-CM

## 2021-03-16 DIAGNOSIS — M6281 Muscle weakness (generalized): Secondary | ICD-10-CM | POA: Diagnosis not present

## 2021-03-16 DIAGNOSIS — R2689 Other abnormalities of gait and mobility: Secondary | ICD-10-CM

## 2021-03-16 NOTE — Therapy (Signed)
[place] MAIN [facility] SERVICES [address] [place], [place], [address] Phone: [phone]   Fax:  [phone]  Physical Therapy Treatment/Physical Therapy Progress Note   Dates of reporting period  02/08/2021   to   03/16/2021   Patient Details  Name: Erica Donovan MRN: [id number] Date of Birth: 11-16-1925 Referring Provider (PT): Anabel Bene, MD   Encounter Date: 03/16/2021   PT End of Session - 03/16/21 1946     Visit Number 10    Number of Visits 25    Date for PT Re-Evaluation 05/03/21    Authorization Type Medicare    Authorization Time Period 02/08/21-05/03/21    PT Start Time 1433    PT Stop Time 1515    PT Time Calculation (min) 42 min    Equipment Utilized During Treatment Gait belt    Activity Tolerance Patient tolerated treatment well    Behavior During Therapy WFL for tasks assessed/performed             Past Medical History:  Diagnosis Date   Arthritis    Cancer ([place])    skin cancer   COPD (chronic obstructive pulmonary disease) ([place])    mild- no inhalers   Dementia ([place])    Depression    Diabetes mellitus without complication ([place])    Eye abnormality    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-[facility] keeps check on this   GERD (gastroesophageal reflux disease)    Hemorrhoids    History of kidney stones    Hyperlipidemia    Hypertension    Leaky heart valve    Paroxysmal SVT (supraventricular tachycardia) ([place])     Past Surgical History:  Procedure Laterality Date   ABDOMINAL HYSTERECTOMY     APPENDECTOMY  1945   EYE SURGERY     cataract bil   HEMORRHOID SURGERY     JOINT REPLACEMENT Left 2008   ORIF WRIST FRACTURE Right 11/06/2018   Procedure: OPEN REDUCTION INTERNAL FIXATION (ORIF) WRIST FRACTURE RIGHT - DIABETIC, SLEEP APNEA;  Surgeon: Hessie Knows, MD;  Location: [facility] ORS;  Service: Orthopedics;  Laterality: Right;    There were no vitals filed for this visit.   Subjective  Assessment - 03/16/21 1944     Subjective Daughter present with pt at session. Reports she has been helping pt perform HEP. Reports pt was able to perform 3 STS hands-free at home. Pt reports no dizziness and no recent dizziness. Denies any falls or near-falls .    Patient is accompained by: Family member    Pertinent History Pt presents to PT evaluation with her daughter. Pt prefers to go by Erica Donovan. Pt has dementia and has some difficulty answering questions; her daughter answers most questions on her behalf. The pt has had dizzy spells on and off for a few years. Worst recent dizzy spell occurred about a month ago where pt had trouble getting out of bed. Pt also with fall d/t dizziness last may that resulted in a brain bleed. Pt has seen neurologist and currently they are unsure if dizziness is d/t crystals or if it is cardiac related. Pt does report she sometimes feels like she is going to pass out. She also reports the room sometimes looks as if it is spinning. She reports worst sx with initially standing up. She is sometimes dizzy when she is not moving. However, it is unclear if she is turning her head when this happens. Pt unsure of duration of  sx. Her daughter reports that pt's dizziness has lasted at least an hour before. Pt unsure if she has been nauseated with her sx. Pt does have tinnitus. Pt sometimes has headaches, but report they are not migraines. Pt unsure of what makes her sx better. She does report she remains still until sx pass. Pt with hx of double vision, is being followed by a doctor d/t bleed behind L eye (still being monitored). Pt has fallen 2x in the last six months. They have been trying exercises at home where pt lays down and turns her head, but this at times makes her sx worse. Other PMH includes afib, HTN, T2DM, HLD, CKD, altered mental status, acute encephalopathy, postural dizziness with near syncope.    Limitations Standing;Walking;House hold activities;Lifting    How long can  you sit comfortably? not limited    How long can you stand comfortably? Pt can stand for about 10 minutes    How long can you walk comfortably? can walk from chair to bathroom with her RW, prior to onset of dizziness could walk up and down her street    Diagnostic tests per chart:  MR brain WO contrast 8/5: No evidence of acute intracranial abnormality. Moderate chronic small vessel ischemic changes within the cerebral   white matter, stable as compared to the brain MRI of 04/11/2020.   Moderate generalized cerebral atrophy. Comparatively mild cerebellar   atrophy. These findings are also stable. Severe mucosal thickening within the right maxillary sinus, progressed.    Patient Stated Goals To make the dizziness go away    Currently in Pain? No/denies    Pain Onset More than a month ago             INTERVENTIONS -  Retesting of goals   Pt performed 2 warm-up trials of TUG prior to test trial of 40 sec, d/t cognition.   FOTO: 50  ABC Scale: 52.5%; requires frequent assist/redirection to task  DHI: 0  Cuing for STS 3x throughout session  Pt required frequent assist/cuing from PT and daughter to attend to task and for understanding questionnaires. PT then educated pt and daughter on reassessment findings, indications for PT/POC. PT reinforced importance of HEP. Pt daughter (her caregiver) verbalized understanding.        PT Short Term Goals - 03/16/21 1946       PT SHORT TERM GOAL #1   Title Pt and/or caregiver will be independent with HEP in order to improve strength and balance in order to decrease fall risk and improve function at home and work.    Baseline 8/10: to be initiated; 9/15: Revised goal to include pt's daughter as indep with HEP d/t pt cognition. Per pt daughter they are frequently performing HEP    Time 6    Period Weeks    Status Partially Met    Target Date 03/22/21               PT Long Term Goals - 03/16/21 1948       PT LONG TERM GOAL #1   Title  Patient will increase FOTO score to equal to or greater than 50 to demonstrate statistically significant improvement in mobility and quality of life.    Baseline 8/10: 43; 9/15: 50    Time 12    Period Weeks    Status Achieved      PT LONG TERM GOAL #2   Title Pt will decrease DHI score by at least 18 points in order  to demonstrate clinically significant reduction in disability    Baseline 8/10: 54; 9/15: score 0 (daughter had to assist pt with questionnaire d/t pt congition)    Time 12    Status Achieved      PT LONG TERM GOAL #3   Title Pt will improve ABC by at least 13% in order to demonstrate clinically significant improvement in balance confidence.    Baseline 8/10: deferred; 9/15: 52.5 (pt daughter assisted with filling out d/t pt cognition)    Time 12    Period Weeks    Status On-going    Target Date 05/03/21      PT LONG TERM GOAL #4   Title Pt will decrease TUG to below 14 seconds/decrease in order to demonstrate decreased fall risk.    Baseline 8/10: deferred 9/15: 40 sec    Time 12    Period Weeks    Status On-going    Target Date 05/03/21                   Plan - 03/16/21 1956     Clinical Impression Statement Retesting completed on this date for progress note. Pt has achieved two of her therapy goals, with a DHI score of 0 and a FOTO score of 50.  Per pt and her duaghter, dizziness sx have resolved, however pt still struggles with balance. TUG score was 40, indicating fall risk. Pt scored a 52.5% on the ABC Scale indicating moderate level of functioning. Patient's condition has the potential to improve in response to therapy. Maximum improvement is yet to be obtained. The anticipated improvement is attainable and reasonable in a generally predictable time. The pt will benefit from further skilled PT to continue to improve balance and strength in order to increase functional mobility and decrease fall risk.    Personal Factors and Comorbidities Age;Comorbidity  1;Comorbidity 3+;Comorbidity 2;Sex;Time since onset of injury/illness/exacerbation;Fitness    Comorbidities per pt and daughter pt has dementia, other PMH of afib, HTN, T2DM, HLD, CKD, altered mental status, acute encephalopathy, postural dizziness with near syncope    Examination-Activity Limitations Bathing;Bed Mobility;Bend;Caring for Others;Dressing;Hygiene/Grooming;Lift;Locomotion Level;Self Feeding;Stand;Toileting;Squat;Stairs;Transfers    Examination-Participation Restrictions Medication Management;Driving;Cleaning;Meal Prep;Yard Work;Community Activity;Laundry;Shop;Other    Stability/Clinical Decision Making Unstable/Unpredictable    Rehab Potential Fair    PT Frequency 2x / week    PT Duration 12 weeks    PT Treatment/Interventions ADLs/Self Care Home Management;Biofeedback;Canalith Repostioning;Cryotherapy;Moist Heat;DME Instruction;Gait training;Stair training;Functional mobility training;Therapeutic activities;Therapeutic exercise;Balance training;Neuromuscular re-education;Patient/family education;Orthotic Fit/Training;Wheelchair mobility training;Manual techniques;Passive range of motion;Energy conservation;Splinting;Taping;Vestibular;Visual/perceptual remediation/compensation    PT Next Visit Plan Continue with balance training and progressive LE strengthening, endurance training.    PT Home Exercise Plan At visit 2, issued VOR training and eyes closed balance. DTR educated on helping. Added some standing LE strengthening and will need to issue handout next visit. 9/1: Access Code: [email or web address]    Consulted and Agree with Plan of Care Patient;Family member/caregiver    Family Member Consulted Daughter             Patient will benefit from skilled therapeutic intervention in order to improve the following deficits and impairments:  Abnormal gait, Decreased activity tolerance, Decreased endurance, Decreased strength, Dizziness, Hypomobility, Improper body mechanics, Cardiopulmonary  status limiting activity, Decreased balance, Decreased mobility, Difficulty walking, Impaired vision/preception, Postural dysfunction, Obesity  Visit Diagnosis: Unsteadiness on feet  Other abnormalities of gait and mobility  Muscle weakness (generalized)     Problem List Patient Active Problem List   Diagnosis Date Noted  Acute encephalopathy 04/11/2020   AMS (altered mental status) 04/10/2020   Postural dizziness with near syncope 03/02/2019    Zollie Pee, PT 03/16/2021, 8:01 PM  [place] MAIN [facility] SERVICES [place], [place], [address] Phone: [phone]   Fax:  [phone]  Name: Erica Donovan MRN: [id number] Date of Birth: 1925-08-10

## 2021-03-21 ENCOUNTER — Other Ambulatory Visit: Payer: Self-pay

## 2021-03-21 ENCOUNTER — Ambulatory Visit: Payer: Medicare Other

## 2021-03-21 DIAGNOSIS — R2681 Unsteadiness on feet: Secondary | ICD-10-CM

## 2021-03-21 DIAGNOSIS — R2689 Other abnormalities of gait and mobility: Secondary | ICD-10-CM

## 2021-03-21 DIAGNOSIS — M6281 Muscle weakness (generalized): Secondary | ICD-10-CM

## 2021-03-21 NOTE — Therapy (Signed)
[place] MAIN [facility] SERVICES [address] [place], [place], [address] Phone: [phone]   Fax:  [phone]  Physical Therapy Treatment/DISCHARGE NOTE  Patient Details  Name: Erica Donovan MRN: [id number] Date of Birth: 02-28-1926 Referring Provider (PT): Anabel Bene, MD   Encounter Date: 03/21/2021   PT End of Session - 03/21/21 1405     Visit Number 11    Number of Visits 25    Date for PT Re-Evaluation 05/03/21    Authorization Type Medicare    Authorization Time Period 02/08/21-05/03/21    PT Start Time 1146    PT Stop Time 24    PT Time Calculation (min) 34 min    Equipment Utilized During Treatment Gait belt    Activity Tolerance Patient tolerated treatment well    Behavior During Therapy WFL for tasks assessed/performed             Past Medical History:  Diagnosis Date   Arthritis    Cancer ([place])    skin cancer   COPD (chronic obstructive pulmonary disease) ([place])    mild- no inhalers   Dementia ([place])    Depression    Diabetes mellitus without complication ([place])    Eye abnormality    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-[facility] keeps check on this   GERD (gastroesophageal reflux disease)    Hemorrhoids    History of kidney stones    Hyperlipidemia    Hypertension    Leaky heart valve    Paroxysmal SVT (supraventricular tachycardia) ([place])     Past Surgical History:  Procedure Laterality Date   ABDOMINAL HYSTERECTOMY     APPENDECTOMY  1945   EYE SURGERY     cataract bil   HEMORRHOID SURGERY     JOINT REPLACEMENT Left 2008   ORIF WRIST FRACTURE Right 11/06/2018   Procedure: OPEN REDUCTION INTERNAL FIXATION (ORIF) WRIST FRACTURE RIGHT - DIABETIC, SLEEP APNEA;  Surgeon: Hessie Knows, MD;  Location: [facility] ORS;  Service: Orthopedics;  Laterality: Right;    There were no vitals filed for this visit.   Subjective Assessment - 03/21/21 1149     Subjective Pt daughter present with pt.  Says pt was able to walk up and down a slope independently with RW. Pt's daughter also reports pt was able to ascended/descended 4 steps without difficulty. Pt reports she hasn't had any dizziness. Pt and daugther report they feel pt is ready to graduate from PT and feel independent with performing HEP on their own outside of PT.    Patient is accompained by: Family member    Pertinent History Pt presents to PT evaluation with her daughter. Pt prefers to go by Erica Donovan. Pt has dementia and has some difficulty answering questions; her daughter answers most questions on her behalf. The pt has had dizzy spells on and off for a few years. Worst recent dizzy spell occurred about a month ago where pt had trouble getting out of bed. Pt also with fall d/t dizziness last may that resulted in a brain bleed. Pt has seen neurologist and currently they are unsure if dizziness is d/t crystals or if it is cardiac related. Pt does report she sometimes feels like she is going to pass out. She also reports the room sometimes looks as if it is spinning. She reports worst sx with initially standing up. She is sometimes dizzy when she is not moving. However, it is unclear if she is turning her head  when this happens. Pt unsure of duration of sx. Her daughter reports that pt's dizziness has lasted at least an hour before. Pt unsure if she has been nauseated with her sx. Pt does have tinnitus. Pt sometimes has headaches, but report they are not migraines. Pt unsure of what makes her sx better. She does report she remains still until sx pass. Pt with hx of double vision, is being followed by a doctor d/t bleed behind L eye (still being monitored). Pt has fallen 2x in the last six months. They have been trying exercises at home where pt lays down and turns her head, but this at times makes her sx worse. Other PMH includes afib, HTN, T2DM, HLD, CKD, altered mental status, acute encephalopathy, postural dizziness with near syncope.     Limitations Standing;Walking;House hold activities;Lifting    How long can you sit comfortably? not limited    How long can you stand comfortably? Pt can stand for about 10 minutes    How long can you walk comfortably? can walk from chair to bathroom with her RW, prior to onset of dizziness could walk up and down her street    Diagnostic tests per chart:  MR brain WO contrast 8/5: No evidence of acute intracranial abnormality. Moderate chronic small vessel ischemic changes within the cerebral   white matter, stable as compared to the brain MRI of 04/11/2020.   Moderate generalized cerebral atrophy. Comparatively mild cerebellar   atrophy. These findings are also stable. Severe mucosal thickening within the right maxillary sinus, progressed.    Patient Stated Goals To make the dizziness go away    Currently in Pain? No/denies    Pain Onset More than a month ago             INTERVENTIONS  Seated marches - 3x10 each LE with TC/VC for technique. Seated LAQ - 3x10 each LE with TC/VC for technique STS from standard height chair - 6x, did perform some initial reps with use of BUEs to assist, but after cuing performed remaining reps hands-free At support surface with CGA:  Neuro- Tandem stance 1x30 sec each LE; intermittent UE support  Seated VORx1 with horizontal head turns - 2x30 sec. PT providing demo/VC for technique.   FOTO: 67    Access Code: [email or web address] URL: [email or web address] Date: 03/21/2021 Prepared by: Ricard Dillon  Exercises Seated Gaze Stabilization with Head Rotation - 1 x daily - 7 x weekly - 6 sets - 2 reps - 30 hold Seated March - 1 x daily - 4 x weekly - 3 sets - 10 reps Sit to Stand with Counter Support - 1 x daily - 4 x weekly - 3 sets - 10 reps Seated Long Arc Quad - 1 x daily - 4 x weekly - 3 sets - 10 reps Standing Tandem Balance with Counter Support - 1 x daily - 7 x weekly - 2 sets - 2 reps - 30 hold  PT provides education on how to progress  each exercise and how to perform each exercise safely. Instructs for standing balance exercise to always be performed at support surface with chair behind pt and caregiver present next to pt. PT also instructed pt to always use RW for ambulation. PT instructed pt and caregiver in progressive walking program, such as starting out ambulating in 5 to 10 minute intervals and increasing this over time. Pt and caregiver verbalized understanding.   Pt educated throughout session about proper posture and technique with exercises. Improved exercise technique,  movement at target joints, use of target muscles after min to mod verbal, visual, tactile cues.    PT Short Term Goals - 03/21/21 1406       PT SHORT TERM GOAL #1   Title Pt and/or caregiver will be independent with HEP in order to improve strength and balance in order to decrease fall risk and improve function at home and work.    Baseline 8/10: to be initiated; 9/15: Revised goal to include pt's daughter as indep with HEP d/t pt cognition. Per pt daughter they are frequently performing HEP; 9/20: Pt caregiver is fully indep with HEP and is able to continue assisting pt after d/c from PT    Time 6    Period Weeks    Status Achieved    Target Date 03/22/21               PT Long Term Goals - 03/21/21 1407       PT LONG TERM GOAL #1   Title Patient will increase FOTO score to equal to or greater than 50 to demonstrate statistically significant improvement in mobility and quality of life.    Baseline 8/10: 43; 9/15: 50; 9/20: 67    Time 12    Period Weeks    Status Achieved      PT LONG TERM GOAL #2   Title Pt will decrease DHI score by at least 18 points in order to demonstrate clinically significant reduction in disability    Baseline 8/10: 54; 9/15: score 0 (daughter had to assist pt with questionnaire d/t pt congition)    Time 12    Status Achieved      PT LONG TERM GOAL #3   Title Pt will improve ABC by at least 13% in order to  demonstrate clinically significant improvement in balance confidence.    Baseline 8/10: deferred; 9/15: 52.5 (pt daughter assisted with filling out d/t pt cognition)    Time 12    Period Weeks    Status On-going      PT LONG TERM GOAL #4   Title Pt will decrease TUG to below 14 seconds/decrease in order to demonstrate decreased fall risk.    Baseline 8/10: deferred 9/15: 40 sec    Time 12    Period Weeks    Status Not Met                Plan - 03/21/21 1417     Clinical Impression Statement Pt to be discharged from PT on this date per pt and caregiver's request as they feel pt is ready to be discharged and they feel independent with carrying out HEP at home. Since prior session pt caregiver reports pt mobliity even better than initially thought with pt ambulating with RW up and down hills and ascending/descending steps without issue. Pt has also had complete resolution of dizziness sx that brought her to PT. At prior appointment, where pt's goals were reassessed, pt's DHI score was 0, indicating no impairement d/t dizziness. Pt FOTO score today is 67, a signifcant improvement from initial score of 43. This indicates improved QOL and functional mobility. PT spent remainder of session instructing pt and caregiver through HEP in order to continue gains beyond therapy. Pt's caregiver reports independence with assisting pt in HEP. PT instructed pt and caregiver to reach out should they have any remaining questions or to pursue new referral should they feel pt regresses. The pt has met majority of physical therapy goals and does  not require further PT at this time.    Personal Factors and Comorbidities Age;Comorbidity 1;Comorbidity 3+;Comorbidity 2;Sex;Time since onset of injury/illness/exacerbation;Fitness    Comorbidities per pt and daughter pt has dementia, other PMH of afib, HTN, T2DM, HLD, CKD, altered mental status, acute encephalopathy, postural dizziness with near syncope     Examination-Activity Limitations Bathing;Bed Mobility;Bend;Caring for Others;Dressing;Hygiene/Grooming;Lift;Locomotion Level;Self Feeding;Stand;Toileting;Squat;Stairs;Transfers    Examination-Participation Restrictions Medication Management;Driving;Cleaning;Meal Prep;Yard Work;Community Activity;Laundry;Shop;Other    Stability/Clinical Decision Making Unstable/Unpredictable    Rehab Potential Fair    PT Frequency 2x / week    PT Duration 12 weeks    PT Treatment/Interventions ADLs/Self Care Home Management;Biofeedback;Canalith Repostioning;Cryotherapy;Moist Heat;DME Instruction;Gait training;Stair training;Functional mobility training;Therapeutic activities;Therapeutic exercise;Balance training;Neuromuscular re-education;Patient/family education;Orthotic Fit/Training;Wheelchair mobility training;Manual techniques;Passive range of motion;Energy conservation;Splinting;Taping;Vestibular;Visual/perceptual remediation/compensation    PT Next Visit Plan Continue with balance training and progressive LE strengthening, endurance training. Pt dc'd.    PT Home Exercise Plan At visit 2, issued VOR training and eyes closed balance. DTR educated on helping. Added some standing LE strengthening and will need to issue handout next visit. 9/1: Access Code: [email or web address]; Access Code: [email or web address]    [email or web address] and Agree with Plan of Care Patient;Family member/caregiver    Family Member Consulted Daughter             Patient will benefit from skilled therapeutic intervention in order to improve the following deficits and impairments:  Abnormal gait, Decreased activity tolerance, Decreased endurance, Decreased strength, Dizziness, Hypomobility, Improper body mechanics, Cardiopulmonary status limiting activity, Decreased balance, Decreased mobility, Difficulty walking, Impaired vision/preception, Postural dysfunction, Obesity  Visit Diagnosis: Muscle weakness (generalized)  Unsteadiness on feet  Other abnormalities of  gait and mobility     Problem List Patient Active Problem List   Diagnosis Date Noted   Acute encephalopathy 04/11/2020   AMS (altered mental status) 04/10/2020   Postural dizziness with near syncope 03/02/2019    Zollie Pee, PT 03/21/2021, 2:27 PM  [place] [address] [place], [place], [address] Phone: [phone]   Fax:  [phone]  Name: Erica Donovan MRN: [id number] Date of Birth: 28-Apr-1926

## 2021-03-23 ENCOUNTER — Ambulatory Visit: Payer: Medicare Other

## 2021-03-28 ENCOUNTER — Ambulatory Visit: Payer: Medicare Other

## 2021-03-30 ENCOUNTER — Ambulatory Visit: Payer: Medicare Other

## 2021-04-04 ENCOUNTER — Ambulatory Visit: Payer: Medicare Other

## 2021-04-06 ENCOUNTER — Ambulatory Visit: Payer: Medicare Other

## 2021-04-11 ENCOUNTER — Ambulatory Visit: Payer: Medicare Other

## 2021-04-13 ENCOUNTER — Ambulatory Visit: Payer: Medicare Other

## 2021-04-18 ENCOUNTER — Ambulatory Visit: Payer: Medicare Other

## 2021-04-20 ENCOUNTER — Ambulatory Visit: Payer: Medicare Other

## 2021-04-21 ENCOUNTER — Ambulatory Visit: Payer: Medicare Other | Attending: Internal Medicine

## 2021-04-21 ENCOUNTER — Other Ambulatory Visit: Payer: Self-pay

## 2021-04-21 DIAGNOSIS — Z23 Encounter for immunization: Secondary | ICD-10-CM

## 2021-04-21 MED ORDER — PFIZER COVID-19 VAC BIVALENT 30 MCG/0.3ML IM SUSP
INTRAMUSCULAR | 0 refills | Status: DC
  Filled 2021-04-21: qty 0.3, 1d supply, fill #0

## 2021-04-21 MED ORDER — FLUAD QUADRIVALENT 0.5 ML IM PRSY
PREFILLED_SYRINGE | INTRAMUSCULAR | 0 refills | Status: DC
  Filled 2021-04-21: qty 0.5, 1d supply, fill #0

## 2021-04-21 NOTE — Progress Notes (Signed)
   Covid-19 Vaccination Clinic  Name:  GRATIA DISLA    MRN: [id number] DOB: 1925/12/11  04/21/2021  Ms. Rief was observed post Covid-19 immunization for 15 minutes without incident. She was provided with Vaccine Information Sheet and instruction to access the V-Safe system.   Ms. Nelon was instructed to call 911 with any severe reactions post vaccine: Difficulty breathing  Swelling of face and throat  A fast heartbeat  A bad rash all over body  Dizziness and weakness   Immunizations Administered     Name Date Dose VIS Date Route   Pfizer Covid-19 Vaccine Bivalent Booster 04/21/2021  2:38 PM 0.3 mL 03/01/2021 Intramuscular   Manufacturer: [place]   Lot: [place]: [id number]      Lu Duffel, PharmD, MBA Clinical Acute Care Pharmacist

## 2021-04-24 ENCOUNTER — Ambulatory Visit: Payer: Medicare Other

## 2021-04-26 ENCOUNTER — Ambulatory Visit: Payer: Medicare Other

## 2021-05-16 ENCOUNTER — Other Ambulatory Visit
Admission: RE | Admit: 2021-05-16 | Discharge: 2021-05-16 | Disposition: A | Discharge status: Home / Self Care | Payer: Medicare Other | Source: Ambulatory Visit | Attending: Ophthalmology | Admitting: Ophthalmology

## 2021-05-16 DIAGNOSIS — R519 Headache, unspecified: Secondary | ICD-10-CM | POA: Diagnosis present

## 2021-05-16 LAB — CBC
HCT: 37.9 % (ref 36.0–46.0)
Hemoglobin: 12.7 g/dL (ref 12.0–15.0)
MCH: 32.8 pg (ref 26.0–34.0)
MCHC: 33.5 g/dL (ref 30.0–36.0)
MCV: 97.9 fL (ref 80.0–100.0)
Platelets: 217 10*3/uL (ref 150–400)
RBC: 3.87 MIL/uL (ref 3.87–5.11)
RDW: 12.9 % (ref 11.5–15.5)
WBC: 7.7 10*3/uL (ref 4.0–10.5)
nRBC: 0 % (ref 0.0–0.2)

## 2021-05-16 LAB — SEDIMENTATION RATE: Sed Rate: 21 mm/hr (ref 0–30)

## 2021-05-16 LAB — C-REACTIVE PROTEIN: CRP: 1.5 mg/dL — ABNORMAL HIGH (ref <1.0)

## 2021-12-06 ENCOUNTER — Ambulatory Visit (INDEPENDENT_AMBULATORY_CARE_PROVIDER_SITE_OTHER): Payer: Medicare Other | Admitting: Dermatology

## 2021-12-06 DIAGNOSIS — L219 Seborrheic dermatitis, unspecified: Secondary | ICD-10-CM

## 2021-12-06 DIAGNOSIS — L304 Erythema intertrigo: Secondary | ICD-10-CM

## 2021-12-06 DIAGNOSIS — L820 Inflamed seborrheic keratosis: Secondary | ICD-10-CM

## 2021-12-06 MED ORDER — MOMETASONE FUROATE 0.1 % EX CREA: TOPICAL_CREAM | CUTANEOUS | 0 refills | Status: DC

## 2021-12-06 MED ORDER — KETOCONAZOLE 2 % EX SHAM: MEDICATED_SHAMPOO | CUTANEOUS | 6 refills | Status: DC

## 2021-12-06 NOTE — Progress Notes (Signed)
Follow-Up Visit   Subjective  Erica Donovan is a 88 y.o. female who presents for the following: Annual Exam. The patient presents for Total-Body Skin Exam (TBSE) for skin cancer screening and mole check.  The patient has spots, moles and lesions to be evaluated, some may be new or changing.  Scaling on scalp and rash lower abdomen in folds.  The following portions of the chart were reviewed this encounter and updated as appropriate:       Review of Systems:  No other skin or systemic complaints except as noted in HPI or Assessment and Plan.  Objective  Well appearing patient in no apparent distress; mood and affect are within normal limits.  A full examination was performed including scalp, head, eyes, ears, nose, lips, neck, chest, axillae, abdomen, back, buttocks, bilateral upper extremities, bilateral lower extremities, hands, feet, fingers, toes, fingernails, and toenails. All findings within normal limits unless otherwise noted below.  B/L arms Waxy tan macules with erythema.  Scalp Pink patches with greasy scale.   Inf abdomen Mild erythema.    Assessment & Plan  Inflamed seborrheic keratosis B/L arms  Reassured benign age-related growth.  Recommend observation.  Discussed cryotherapy if spot(s) become irritated or inflamed.   Start Mometasone 0.1% cream to aa's arms QD-BID PRN itch.   mometasone (ELOCON) 0.1 % cream - B/L arms Apply to irritated areas on arms QD-BID PRN.  Seborrheic dermatitis Scalp  Chronic and persistent condition with duration or expected duration over one year. Condition is bothersome/symptomatic for patient. Currently flared.   Seborrheic Dermatitis  -  is a chronic persistent rash characterized by pinkness and scaling most commonly of the mid face but also can occur on the scalp (dandruff), ears; mid chest, mid back and groin.  It tends to be exacerbated by stress and cooler weather.  People who have neurologic disease may experience new  onset or exacerbation of existing seborrheic dermatitis.  The condition is not curable but treatable and can be controlled.  Start Sal acid shampoo let sit a few minutes then wash out.  Start Ketoconazole 2% shampoo let sit a few minutes then wash out.   Recommend washing hair at least twice per week.  Can alternate shampoos   ketoconazole (NIZORAL) 2 % shampoo - Scalp Shampoo into scalp let sit 5-10 minutes then wash out. Use QW.  Erythema intertrigo Inf abdomen  Intertrigo is a chronic recurrent rash that occurs in skin fold areas that may be associated with friction; heat; moisture; yeast; fungus; and bacteria.  It is exacerbated by increased movement / activity; sweating; and higher atmospheric temperature.  Start Zeasorb AF powder to aa daily   Lentigines - Scattered tan macules - Due to sun exposure - Benign-appearing, observe - Recommend daily broad spectrum sunscreen SPF 30+ to sun-exposed areas, reapply every 2 hours as needed. - Call for any changes  Seborrheic Keratoses - Stuck-on, waxy, tan-brown papules and/or plaques  - Benign-appearing - Discussed benign etiology and prognosis. - Observe - Call for any changes  Melanocytic Nevi - Tan-brown and/or pink-flesh-colored symmetric macules and papules - Benign appearing on exam today - Observation - Call clinic for new or changing moles - Recommend daily use of broad spectrum spf 30+ sunscreen to sun-exposed areas.   Hemangiomas - Red papules - Discussed benign nature - Observe - Call for any changes  Actinic Damage - Chronic condition, secondary to cumulative UV/sun exposure - diffuse scaly erythematous macules with underlying dyspigmentation - Recommend daily broad  spectrum sunscreen SPF 30+ to sun-exposed areas, reapply every 2 hours as needed.  - Staying in the shade or wearing long sleeves, sun glasses (UVA+UVB protection) and wide brim hats (4-inch brim around the entire circumference of the hat) are  also recommended for sun protection.  - Call for new or changing lesions.  Purpura - Chronic; persistent and recurrent.  Treatable, but not curable. - Violaceous macules and patches - Benign - Related to trauma, age, sun damage and/or use of blood thinners, chronic use of topical and/or oral steroids - Observe - Can use OTC arnica containing moisturizer such as Dermend Bruise Formula if desired - Call for worsening or other concerns  Skin cancer screening performed today.  Return if symptoms worsen or fail to improve.  Luther Redo, CMA, am acting as scribe for Brendolyn Patty, MD .  Documentation: I have reviewed the above documentation for accuracy and completeness, and I agree with the above.  Brendolyn Patty MD

## 2021-12-06 NOTE — Patient Instructions (Addendum)
Recommend Zeasorb AF powder daily to prevent intertrigo in the skin fold areas.   Intertrigo is a chronic recurrent rash that occurs in skin fold areas that may be associated with friction; heat; moisture; yeast; fungus; and bacteria.  It is exacerbated by increased movement / activity; sweating; and higher atmospheric temperature.   Due to recent changes in healthcare laws, you may see results of your pathology and/or laboratory studies on MyChart before the doctors have had a chance to review them. We understand that in some cases there may be results that are confusing or concerning to you. Please understand that not all results are received at the same time and often the doctors may need to interpret multiple results in order to provide you with the best plan of care or course of treatment. Therefore, we ask that you please give [place] 2 business days to thoroughly review all your results before contacting the office for clarification. Should we see a critical lab result, you will be contacted sooner.   If You Need Anything After Your Visit  If you have any questions or concerns for your doctor, please call our main line at [phone] and press option 4 to reach your doctor's medical assistant. If no one answers, please leave a voicemail as directed and we will return your call as soon as possible. Messages left after 4 pm will be answered the following business day.   You may also send [place] a message via [place]. We typically respond to MyChart messages within 1-2 business days.  For prescription refills, please ask your pharmacy to contact our office. Our fax number is [phone].  If you have an urgent issue when the clinic is closed that cannot wait until the next business day, you can page your doctor at the number below.    Please note that while we do our best to be available for urgent issues outside of office hours, we are not available 24/7.   If you have an urgent issue and are  unable to reach [place], you may choose to seek medical care at your doctor's office, retail clinic, urgent care center, or emergency room.  If you have a medical emergency, please immediately call 911 or go to the emergency department.  Pager Numbers  - Dr. Nehemiah Massed: [phone]  - Dr. Laurence Ferrari: [phone]  - Dr. Nicole Kindred: [phone]  In the event of inclement weather, please call our main line at [phone] for an update on the status of any delays or closures.  Dermatology Medication Tips: Please keep the boxes that topical medications come in in order to help keep track of the instructions about where and how to use these. Pharmacies typically print the medication instructions only on the boxes and not directly on the medication tubes.   If your medication is too expensive, please contact our office at [phone] option 4 or send [place] a message through [place].   We are unable to tell what your co-pay for medications will be in advance as this is different depending on your insurance coverage. However, we may be able to find a substitute medication at lower cost or fill out paperwork to get insurance to cover a needed medication.   If a prior authorization is required to get your medication covered by your insurance company, please allow [place] 1-2 business days to complete this process.  Drug prices often vary depending on where the prescription is filled and some pharmacies may offer cheaper prices.  The website www.goodrx.com contains coupons  for medications through different pharmacies. The prices here do not account for what the cost may be with help from insurance (it may be cheaper with your insurance), but the website can give you the price if you did not use any insurance.  - You can print the associated coupon and take it with your prescription to the pharmacy.  - You may also stop by our office during regular business hours and pick up a GoodRx coupon card.  - If you need your  prescription sent electronically to a different pharmacy, notify our office through [facility] or by phone at [phone] option 4.     Si Usted Necesita Algo Despus de Su Visita  Tambin puede enviarnos un mensaje a travs de Pharmacist, community. Por lo general respondemos a los mensajes de MyChart en el transcurso de 1 a 2 das hbiles.  Para renovar recetas, por favor pida a su farmacia que se ponga en contacto con nuestra oficina. Harland Dingwall de fax es [place] [phone].  Si tiene un asunto urgente cuando la clnica est cerrada y que no puede esperar hasta el siguiente da hbil, puede llamar/localizar a su doctor(a) al nmero que aparece a continuacin.   Por favor, tenga en cuenta que aunque hacemos todo lo posible para estar disponibles para asuntos urgentes fuera del horario de [place], no estamos disponibles las 24 horas del da, los 7 das de la [place].   Si tiene un problema urgente y no puede comunicarse con nosotros, puede optar por buscar atencin mdica  en el consultorio de su doctor(a), en una clnica privada, en un centro de atencin urgente o en una sala de emergencias.  Si tiene Engineering geologist, por favor llame inmediatamente al 911 o vaya a la sala de emergencias.  Nmeros de bper  - Dr. Nehemiah Massed: [phone]  - Dra. Moye: [phone]  - Dra. Nicole Kindred: [phone]  En caso de inclemencias del [place], por favor llame a Johnsie Kindred principal al [phone] para una actualizacin sobre el [place] de cualquier retraso o cierre.  Consejos para la medicacin en dermatologa: Por favor, guarde las cajas en las que vienen los medicamentos de uso tpico para ayudarle a seguir las instrucciones sobre dnde y cmo usarlos. Las farmacias generalmente imprimen las instrucciones del medicamento slo en las cajas y no directamente en los tubos del [place].   Si su medicamento es muy caro, por favor, pngase en contacto con Zigmund Daniel llamando al [phone]  y presione la opcin 4 o envenos un mensaje a travs de Pharmacist, community.   No podemos decirle cul ser su copago por los medicamentos por adelantado ya que esto es diferente dependiendo de la cobertura de su seguro. Sin embargo, es posible que podamos encontrar un medicamento sustituto a Electrical engineer un formulario para que el seguro cubra el medicamento que se considera necesario.   Si se requiere una autorizacin previa para que su compaa de seguros [place] su medicamento, por favor permtanos de 1 a 2 das hbiles para completar este proceso.  Los precios de los medicamentos varan con frecuencia dependiendo del Environmental consultant de dnde se surte la receta y alguna farmacias pueden ofrecer precios ms baratos.  El sitio web www.goodrx.com tiene cupones para medicamentos de Airline pilot. Los precios aqu no tienen en cuenta lo que podra costar con la ayuda del seguro (puede ser ms barato con su seguro), pero el sitio web puede darle el precio si no utiliz Research scientist (physical sciences).  - Puede imprimir el cupn correspondiente  y llevarlo con su receta a la farmacia.  - Tambin puede pasar por nuestra oficina durante el horario de atencin regular y Charity fundraiser una tarjeta de cupones de GoodRx.  - Si necesita que su receta se enve electrnicamente a una farmacia diferente, informe a nuestra oficina a travs de MyChart de [place] o por telfono llamando al [phone] y presione la opcin 4.

## 2021-12-07 ENCOUNTER — Other Ambulatory Visit: Payer: Self-pay | Admitting: Nurse Practitioner

## 2021-12-07 DIAGNOSIS — K219 Gastro-esophageal reflux disease without esophagitis: Secondary | ICD-10-CM

## 2021-12-07 DIAGNOSIS — R1310 Dysphagia, unspecified: Secondary | ICD-10-CM

## 2021-12-14 ENCOUNTER — Ambulatory Visit
Admission: RE | Admit: 2021-12-14 | Discharge: 2021-12-14 | Disposition: A | Discharge status: Home / Self Care | Payer: Medicare Other | Source: Ambulatory Visit | Attending: Nurse Practitioner | Admitting: Nurse Practitioner

## 2021-12-14 DIAGNOSIS — K219 Gastro-esophageal reflux disease without esophagitis: Secondary | ICD-10-CM

## 2021-12-14 DIAGNOSIS — R1310 Dysphagia, unspecified: Secondary | ICD-10-CM | POA: Diagnosis present

## 2022-04-12 ENCOUNTER — Other Ambulatory Visit: Payer: Self-pay

## 2022-04-21 ENCOUNTER — Other Ambulatory Visit: Payer: Self-pay

## 2022-04-21 ENCOUNTER — Emergency Department: Payer: Medicare Other

## 2022-04-21 ENCOUNTER — Inpatient Hospital Stay
Admission: EM | Admit: 2022-04-21 | Discharge: 2022-04-28 | DRG: 871 | Disposition: A | Discharge status: Hospice | Payer: Medicare Other | Attending: Hospitalist | Admitting: Hospitalist

## 2022-04-21 DIAGNOSIS — N390 Urinary tract infection, site not specified: Secondary | ICD-10-CM | POA: Diagnosis present

## 2022-04-21 DIAGNOSIS — I959 Hypotension, unspecified: Secondary | ICD-10-CM | POA: Diagnosis present

## 2022-04-21 DIAGNOSIS — I10 Essential (primary) hypertension: Secondary | ICD-10-CM | POA: Diagnosis present

## 2022-04-21 DIAGNOSIS — Z90710 Acquired absence of both cervix and uterus: Secondary | ICD-10-CM

## 2022-04-21 DIAGNOSIS — Z515 Encounter for palliative care: Secondary | ICD-10-CM

## 2022-04-21 DIAGNOSIS — Z881 Allergy status to other antibiotic agents status: Secondary | ICD-10-CM

## 2022-04-21 DIAGNOSIS — I700 Atherosclerosis of aorta: Secondary | ICD-10-CM | POA: Diagnosis present

## 2022-04-21 DIAGNOSIS — F0393 Unspecified dementia, unspecified severity, with mood disturbance: Secondary | ICD-10-CM | POA: Diagnosis present

## 2022-04-21 DIAGNOSIS — R6520 Severe sepsis without septic shock: Secondary | ICD-10-CM | POA: Diagnosis present

## 2022-04-21 DIAGNOSIS — N182 Chronic kidney disease, stage 2 (mild): Secondary | ICD-10-CM | POA: Diagnosis present

## 2022-04-21 DIAGNOSIS — I452 Bifascicular block: Secondary | ICD-10-CM | POA: Diagnosis present

## 2022-04-21 DIAGNOSIS — N179 Acute kidney failure, unspecified: Secondary | ICD-10-CM | POA: Diagnosis present

## 2022-04-21 DIAGNOSIS — Z79899 Other long term (current) drug therapy: Secondary | ICD-10-CM

## 2022-04-21 DIAGNOSIS — I480 Paroxysmal atrial fibrillation: Secondary | ICD-10-CM | POA: Diagnosis present

## 2022-04-21 DIAGNOSIS — I2489 Other forms of acute ischemic heart disease: Secondary | ICD-10-CM | POA: Diagnosis present

## 2022-04-21 DIAGNOSIS — G9340 Encephalopathy, unspecified: Secondary | ICD-10-CM | POA: Diagnosis present

## 2022-04-21 DIAGNOSIS — I129 Hypertensive chronic kidney disease with stage 1 through stage 4 chronic kidney disease, or unspecified chronic kidney disease: Secondary | ICD-10-CM | POA: Diagnosis present

## 2022-04-21 DIAGNOSIS — Z888 Allergy status to other drugs, medicaments and biological substances status: Secondary | ICD-10-CM

## 2022-04-21 DIAGNOSIS — F32A Depression, unspecified: Secondary | ICD-10-CM | POA: Diagnosis present

## 2022-04-21 DIAGNOSIS — E119 Type 2 diabetes mellitus without complications: Secondary | ICD-10-CM

## 2022-04-21 DIAGNOSIS — R4182 Altered mental status, unspecified: Secondary | ICD-10-CM | POA: Diagnosis not present

## 2022-04-21 DIAGNOSIS — E1122 Type 2 diabetes mellitus with diabetic chronic kidney disease: Secondary | ICD-10-CM | POA: Diagnosis present

## 2022-04-21 DIAGNOSIS — E785 Hyperlipidemia, unspecified: Secondary | ICD-10-CM | POA: Diagnosis present

## 2022-04-21 DIAGNOSIS — Z87891 Personal history of nicotine dependence: Secondary | ICD-10-CM

## 2022-04-21 DIAGNOSIS — A419 Sepsis, unspecified organism: Principal | ICD-10-CM | POA: Diagnosis present

## 2022-04-21 DIAGNOSIS — R7401 Elevation of levels of liver transaminase levels: Secondary | ICD-10-CM | POA: Diagnosis not present

## 2022-04-21 DIAGNOSIS — Z85828 Personal history of other malignant neoplasm of skin: Secondary | ICD-10-CM

## 2022-04-21 DIAGNOSIS — Z66 Do not resuscitate: Secondary | ICD-10-CM | POA: Diagnosis present

## 2022-04-21 DIAGNOSIS — K219 Gastro-esophageal reflux disease without esophagitis: Secondary | ICD-10-CM | POA: Diagnosis present

## 2022-04-21 DIAGNOSIS — Z789 Other specified health status: Secondary | ICD-10-CM

## 2022-04-21 DIAGNOSIS — K8020 Calculus of gallbladder without cholecystitis without obstruction: Secondary | ICD-10-CM | POA: Diagnosis present

## 2022-04-21 DIAGNOSIS — R7989 Other specified abnormal findings of blood chemistry: Secondary | ICD-10-CM | POA: Diagnosis not present

## 2022-04-21 DIAGNOSIS — R42 Dizziness and giddiness: Secondary | ICD-10-CM | POA: Diagnosis present

## 2022-04-21 DIAGNOSIS — Z7189 Other specified counseling: Secondary | ICD-10-CM | POA: Diagnosis not present

## 2022-04-21 DIAGNOSIS — N189 Chronic kidney disease, unspecified: Secondary | ICD-10-CM | POA: Diagnosis present

## 2022-04-21 DIAGNOSIS — F0394 Unspecified dementia, unspecified severity, with anxiety: Secondary | ICD-10-CM | POA: Diagnosis present

## 2022-04-21 DIAGNOSIS — G9341 Metabolic encephalopathy: Secondary | ICD-10-CM | POA: Diagnosis present

## 2022-04-21 DIAGNOSIS — Z87442 Personal history of urinary calculi: Secondary | ICD-10-CM

## 2022-04-21 DIAGNOSIS — J449 Chronic obstructive pulmonary disease, unspecified: Secondary | ICD-10-CM | POA: Diagnosis present

## 2022-04-21 DIAGNOSIS — K861 Other chronic pancreatitis: Secondary | ICD-10-CM | POA: Diagnosis present

## 2022-04-21 DIAGNOSIS — Z880 Allergy status to penicillin: Secondary | ICD-10-CM

## 2022-04-21 LAB — COMPREHENSIVE METABOLIC PANEL
ALT: 396 U/L — ABNORMAL HIGH (ref 0–44)
AST: 270 U/L — ABNORMAL HIGH (ref 15–41)
Albumin: 3.9 g/dL (ref 3.5–5.0)
Alkaline Phosphatase: 161 U/L — ABNORMAL HIGH (ref 38–126)
Anion gap: 13 (ref 5–15)
BUN: 55 mg/dL — ABNORMAL HIGH (ref 8–23)
CO2: 22 mmol/L (ref 22–32)
Calcium: 10.2 mg/dL (ref 8.9–10.3)
Chloride: 99 mmol/L (ref 98–111)
Creatinine, Ser: 2.12 mg/dL — ABNORMAL HIGH (ref 0.44–1.00)
GFR, Estimated: 21 mL/min — ABNORMAL LOW (ref >60)
Glucose, Bld: 159 mg/dL — ABNORMAL HIGH (ref 70–99)
Potassium: 4.5 mmol/L (ref 3.5–5.1)
Sodium: 134 mmol/L — ABNORMAL LOW (ref 135–145)
Total Bilirubin: 1.9 mg/dL — ABNORMAL HIGH (ref 0.3–1.2)
Total Protein: 7.5 g/dL (ref 6.5–8.1)

## 2022-04-21 LAB — CBC WITH DIFFERENTIAL/PLATELET
Abs Immature Granulocytes: 0.08 10*3/uL — ABNORMAL HIGH (ref 0.00–0.07)
Basophils Absolute: 0 10*3/uL (ref 0.0–0.1)
Basophils Relative: 0 %
Eosinophils Absolute: 0.4 10*3/uL (ref 0.0–0.5)
Eosinophils Relative: 3 %
HCT: 44.2 % (ref 36.0–46.0)
Hemoglobin: 14 g/dL (ref 12.0–15.0)
Immature Granulocytes: 1 %
Lymphocytes Relative: 6 %
Lymphs Abs: 0.8 10*3/uL (ref 0.7–4.0)
MCH: 31.9 pg (ref 26.0–34.0)
MCHC: 31.7 g/dL (ref 30.0–36.0)
MCV: 100.7 fL — ABNORMAL HIGH (ref 80.0–100.0)
Monocytes Absolute: 0.4 10*3/uL (ref 0.1–1.0)
Monocytes Relative: 3 %
Neutro Abs: 11.8 10*3/uL — ABNORMAL HIGH (ref 1.7–7.7)
Neutrophils Relative %: 87 %
Platelets: 227 10*3/uL (ref 150–400)
RBC: 4.39 MIL/uL (ref 3.87–5.11)
RDW: 13.4 % (ref 11.5–15.5)
WBC: 13.6 10*3/uL — ABNORMAL HIGH (ref 4.0–10.5)
nRBC: 0 % (ref 0.0–0.2)

## 2022-04-21 LAB — LACTIC ACID, PLASMA
Lactic Acid, Venous: 1.5 mmol/L (ref 0.5–1.9)
Lactic Acid, Venous: 1.8 mmol/L (ref 0.5–1.9)

## 2022-04-21 LAB — TROPONIN I (HIGH SENSITIVITY): Troponin I (High Sensitivity): 25 ng/L — ABNORMAL HIGH (ref <18)

## 2022-04-21 MED ORDER — VANCOMYCIN HCL IN DEXTROSE 1-5 GM/200ML-% IV SOLN
1000.0000 mg | Freq: Once | INTRAVENOUS | Status: DC
  Filled 2022-04-21: qty 200

## 2022-04-21 MED ORDER — LACTATED RINGERS IV BOLUS: 1000.0000 mL | Freq: Once | INTRAVENOUS | Status: DC

## 2022-04-21 MED ORDER — SODIUM CHLORIDE 0.9 % IV SOLN
2.0000 g | Freq: Once | INTRAVENOUS | Status: AC
  Administered 2022-04-22: 2 g via INTRAVENOUS
  Filled 2022-04-21: qty 12.5

## 2022-04-21 NOTE — ED Provider Notes (Signed)
[facility] Provider Note    Event Date/Time   First MD Initiated Contact with Patient 04/21/22 2143     (approximate)   History   Altered Mental Status   HPI {Remember to add pertinent medical, surgical, social, and/or OB history to HPI:1} Erica Donovan is a 88 y.o. female  ***       Physical Exam   Triage Vital Signs: ED Triage Vitals  Enc Vitals Group     BP 04/21/22 2016 (!) 148/62     Pulse Rate 04/21/22 2016 (!) 101     Resp 04/21/22 2016 20     Temp 04/21/22 2016 98.3 F (36.8 C)     Temp src --      SpO2 04/21/22 2016 93 %     Weight 04/21/22 2017 165 lb (74.8 kg)     Height --      Head Circumference --      Peak Flow --      Pain Score 04/21/22 2016 5     Pain Loc --      Pain Edu? --      Excl. in [place]? --     Most recent vital signs: Vitals:   04/21/22 2016  BP: (!) 148/62  Pulse: (!) 101  Resp: 20  Temp: 98.3 F (36.8 C)  SpO2: 93%    {Only need to document appropriate and relevant physical exam:1} General: Awake, no distress. *** CV:  Good peripheral perfusion. *** Resp:  Normal effort. *** Abd:  No distention. *** Other:  ***   ED Results / Procedures / Treatments   Labs (all labs ordered are listed, but only abnormal results are displayed) Labs Reviewed  COMPREHENSIVE METABOLIC PANEL - Abnormal; Notable for the following components:      Result Value   Sodium 134 (*)    Glucose, Bld 159 (*)    BUN 55 (*)    Creatinine, Ser 2.12 (*)    AST 270 (*)    ALT 396 (*)    Alkaline Phosphatase 161 (*)    Total Bilirubin 1.9 (*)    GFR, Estimated 21 (*)    All other components within normal limits  CBC WITH DIFFERENTIAL/PLATELET - Abnormal; Notable for the following components:   WBC 13.6 (*)    MCV 100.7 (*)    Neutro Abs 11.8 (*)    Abs Immature Granulocytes 0.08 (*)    All other components within normal limits  LACTIC ACID, PLASMA  LACTIC ACID, PLASMA  URINALYSIS, ROUTINE W REFLEX MICROSCOPIC   TROPONIN I (HIGH SENSITIVITY)     EKG  I, Nance Pear, attending physician, personally viewed and interpreted this EKG  EKG Time: 2029 Rate: 103 Rhythm: sinus tachycardia Axis: left axis deviation Intervals: qtc 474 QRS: RBBB, LAFB ST changes: no st elevation Impression: abnormal ekg   RADIOLOGY I independently interpreted and visualized the CXR. My interpretation: *** Radiology interpretation:  IMPRESSION:  No active cardiopulmonary disease.      PROCEDURES:  Critical Care performed: {CriticalCareYesNo:19197::"Yes, see critical care procedure note(s)","No"}  Procedures   MEDICATIONS ORDERED IN ED: Medications - No data to display   IMPRESSION / MDM / [place] / ED COURSE  I reviewed the triage vital signs and the nursing notes.                              Differential diagnosis includes, but is not  limited to, ***  Patient's presentation is most consistent with {EM COPA:27473}  {If the patient is on the monitor, remove the brackets and asterisks on the sentence below and remember to document it as a Procedure as well. Otherwise delete the sentence below:1} {**The patient is on the cardiac monitor to evaluate for evidence of arrhythmia and/or significant heart rate changes.**} {Remember to include, when applicable, any/all of the following data: independent review of imaging independent review of labs (comment specifically on pertinent positives and negatives) review of specific prior hospitalizations, PCP/specialist notes, etc. discuss meds given and prescribed document any discussion with consultants (including hospitalists) any clinical decision tools you used and why (PECARN, NEXUS, etc.) did you consider admitting the patient? document social determinants of health affecting patient's care (homelessness, inability to follow up in a timely fashion, etc) document any pre-existing conditions increasing risk on current visit (e.g. diabetes  and HTN increasing danger of high-risk chest pain/ACS) describes what meds you gave (especially parenteral) and why any other interventions?:1}     FINAL CLINICAL IMPRESSION(S) / ED DIAGNOSES   Final diagnoses:  None     Rx / DC Orders   ED Discharge Orders     None        Note:  This document was prepared using Dragon voice recognition software and may include unintentional dictation errors.

## 2022-04-21 NOTE — ED Provider Notes (Incomplete)
[facility] Provider Note    Event Date/Time   First MD Initiated Contact with Patient 04/21/22 2143     (approximate)   History   Altered Mental Status   HPI  Erica Donovan is a 88 y.o. female with history of dementia so history is primarily obtained from daughter at bedside who presents to the emergency department today because of concerns for altered mental status.  The patient has been less alert and active than normal. Additionally daughter states that the patient has had decreased appetite and oral intake recently. No fevers.      Physical Exam   Triage Vital Signs: ED Triage Vitals  Enc Vitals Group     BP 04/21/22 2016 (!) 148/62     Pulse Rate 04/21/22 2016 (!) 101     Resp 04/21/22 2016 20     Temp 04/21/22 2016 98.3 F (36.8 C)     Temp src --      SpO2 04/21/22 2016 93 %     Weight 04/21/22 2017 165 lb (74.8 kg)     Height --      Head Circumference --      Peak Flow --      Pain Score 04/21/22 2016 5     Pain Loc --      Pain Edu? --      Excl. in [place]? --     Most recent vital signs: Vitals:   04/21/22 2016  BP: (!) 148/62  Pulse: (!) 101  Resp: 20  Temp: 98.3 F (36.8 C)  SpO2: 93%   General: Awake, alert, not oriented. CV:  Good peripheral perfusion. Tachycardia, irregular rate Resp:  Normal effort. Lungs clear. Abd:  No distention. Diffusely tender.    ED Results / Procedures / Treatments   Labs (all labs ordered are listed, but only abnormal results are displayed) Labs Reviewed  COMPREHENSIVE METABOLIC PANEL - Abnormal; Notable for the following components:      Result Value   Sodium 134 (*)    Glucose, Bld 159 (*)    BUN 55 (*)    Creatinine, Ser 2.12 (*)    AST 270 (*)    ALT 396 (*)    Alkaline Phosphatase 161 (*)    Total Bilirubin 1.9 (*)    GFR, Estimated 21 (*)    All other components within normal limits  CBC WITH DIFFERENTIAL/PLATELET - Abnormal; Notable for the following components:   WBC  13.6 (*)    MCV 100.7 (*)    Neutro Abs 11.8 (*)    Abs Immature Granulocytes 0.08 (*)    All other components within normal limits  LACTIC ACID, PLASMA  LACTIC ACID, PLASMA  URINALYSIS, ROUTINE W REFLEX MICROSCOPIC  TROPONIN I (HIGH SENSITIVITY)     EKG  I, Nance Pear, attending physician, personally viewed and interpreted this EKG  EKG Time: 2029 Rate: 103 Rhythm: sinus tachycardia Axis: left axis deviation Intervals: qtc 474 QRS: RBBB, LAFB ST changes: no st elevation Impression: abnormal ekg   RADIOLOGY I independently interpreted and visualized the CXR. My interpretation: *** Radiology interpretation:  IMPRESSION:  No active cardiopulmonary disease.      PROCEDURES:  Critical Care performed: {CriticalCareYesNo:19197::"Yes, see critical care procedure note(s)","No"}  Procedures   MEDICATIONS ORDERED IN ED: Medications - No data to display   IMPRESSION / MDM / [place] / ED COURSE  I reviewed the triage vital signs and the nursing notes.  Differential diagnosis includes, but is not limited to, ***  Patient's presentation is most consistent with {EM COPA:27473}  {If the patient is on the monitor, remove the brackets and asterisks on the sentence below and remember to document it as a Procedure as well. Otherwise delete the sentence below:1} {**The patient is on the cardiac monitor to evaluate for evidence of arrhythmia and/or significant heart rate changes.**} {Remember to include, when applicable, any/all of the following data: independent review of imaging independent review of labs (comment specifically on pertinent positives and negatives) review of specific prior hospitalizations, PCP/specialist notes, etc. discuss meds given and prescribed document any discussion with consultants (including hospitalists) any clinical decision tools you used and why (PECARN, NEXUS, etc.) did you consider admitting the  patient? document social determinants of health affecting patient's care (homelessness, inability to follow up in a timely fashion, etc) document any pre-existing conditions increasing risk on current visit (e.g. diabetes and HTN increasing danger of high-risk chest pain/ACS) describes what meds you gave (especially parenteral) and why any other interventions?:1}     FINAL CLINICAL IMPRESSION(S) / ED DIAGNOSES   Final diagnoses:  None     Rx / DC Orders   ED Discharge Orders     None        Note:  This document was prepared using Dragon voice recognition software and may include unintentional dictation errors.

## 2022-04-21 NOTE — ED Notes (Addendum)
Pt had fall several weeks ago in September, family states the patient did hit her head but was not evaluated, daughter states she "barely" hit her head.  Daughter states pt has been incontinent since September. Increased confusion also noted. Pt has hx of fall with head bleed several years ago, not currently on blood thinners.

## 2022-04-21 NOTE — ED Triage Notes (Signed)
Pt BIB EMS for AMS that started today. Pt recently treated for UTIx2. Per family, pt has had more weakness, decreased appetite, and more confused. Pt has dementia.    155/63 97 94% RA CBG 171

## 2022-04-22 ENCOUNTER — Inpatient Hospital Stay
Admit: 2022-04-22 | Discharge: 2022-04-22 | Disposition: A | Discharge status: Home / Self Care | Payer: Medicare Other | Attending: Family Medicine | Admitting: Family Medicine

## 2022-04-22 DIAGNOSIS — N189 Chronic kidney disease, unspecified: Secondary | ICD-10-CM

## 2022-04-22 DIAGNOSIS — G9340 Encephalopathy, unspecified: Secondary | ICD-10-CM

## 2022-04-22 DIAGNOSIS — R6520 Severe sepsis without septic shock: Secondary | ICD-10-CM | POA: Diagnosis not present

## 2022-04-22 DIAGNOSIS — I10 Essential (primary) hypertension: Secondary | ICD-10-CM

## 2022-04-22 DIAGNOSIS — R7989 Other specified abnormal findings of blood chemistry: Secondary | ICD-10-CM

## 2022-04-22 DIAGNOSIS — A419 Sepsis, unspecified organism: Secondary | ICD-10-CM

## 2022-04-22 DIAGNOSIS — I480 Paroxysmal atrial fibrillation: Secondary | ICD-10-CM

## 2022-04-22 DIAGNOSIS — Z789 Other specified health status: Secondary | ICD-10-CM

## 2022-04-22 DIAGNOSIS — N179 Acute kidney failure, unspecified: Secondary | ICD-10-CM | POA: Diagnosis not present

## 2022-04-22 DIAGNOSIS — R7401 Elevation of levels of liver transaminase levels: Secondary | ICD-10-CM

## 2022-04-22 DIAGNOSIS — E119 Type 2 diabetes mellitus without complications: Secondary | ICD-10-CM

## 2022-04-22 LAB — CBC
HCT: 31.8 % — ABNORMAL LOW (ref 36.0–46.0)
Hemoglobin: 10.3 g/dL — ABNORMAL LOW (ref 12.0–15.0)
MCH: 32.1 pg (ref 26.0–34.0)
MCHC: 32.4 g/dL (ref 30.0–36.0)
MCV: 99.1 fL (ref 80.0–100.0)
Platelets: 181 10*3/uL (ref 150–400)
RBC: 3.21 MIL/uL — ABNORMAL LOW (ref 3.87–5.11)
RDW: 13.5 % (ref 11.5–15.5)
WBC: 8.8 10*3/uL (ref 4.0–10.5)
nRBC: 0 % (ref 0.0–0.2)

## 2022-04-22 LAB — COMPREHENSIVE METABOLIC PANEL
ALT: 232 U/L — ABNORMAL HIGH (ref 0–44)
AST: 136 U/L — ABNORMAL HIGH (ref 15–41)
Albumin: 2.6 g/dL — ABNORMAL LOW (ref 3.5–5.0)
Alkaline Phosphatase: 111 U/L (ref 38–126)
Anion gap: 7 (ref 5–15)
BUN: 49 mg/dL — ABNORMAL HIGH (ref 8–23)
CO2: 24 mmol/L (ref 22–32)
Calcium: 8.8 mg/dL — ABNORMAL LOW (ref 8.9–10.3)
Chloride: 105 mmol/L (ref 98–111)
Creatinine, Ser: 1.73 mg/dL — ABNORMAL HIGH (ref 0.44–1.00)
GFR, Estimated: 27 mL/min — ABNORMAL LOW (ref >60)
Glucose, Bld: 178 mg/dL — ABNORMAL HIGH (ref 70–99)
Potassium: 4.5 mmol/L (ref 3.5–5.1)
Sodium: 136 mmol/L (ref 135–145)
Total Bilirubin: 1.6 mg/dL — ABNORMAL HIGH (ref 0.3–1.2)
Total Protein: 5.1 g/dL — ABNORMAL LOW (ref 6.5–8.1)

## 2022-04-22 LAB — URINALYSIS, ROUTINE W REFLEX MICROSCOPIC
Bacteria, UA: NONE SEEN
Bilirubin Urine: NEGATIVE
Glucose, UA: NEGATIVE mg/dL
Hgb urine dipstick: NEGATIVE
Ketones, ur: NEGATIVE mg/dL
Nitrite: NEGATIVE
Protein, ur: 100 mg/dL — AB
Specific Gravity, Urine: 1.021 (ref 1.005–1.030)
pH: 5 (ref 5.0–8.0)

## 2022-04-22 LAB — TROPONIN I (HIGH SENSITIVITY): Troponin I (High Sensitivity): 23 ng/L — ABNORMAL HIGH (ref <18)

## 2022-04-22 LAB — PROCALCITONIN: Procalcitonin: 0.78 ng/mL

## 2022-04-22 MED ORDER — ONDANSETRON HCL 4 MG PO TABS: 4.0000 mg | ORAL_TABLET | Freq: Four times a day (QID) | ORAL | Status: DC | PRN

## 2022-04-22 MED ORDER — ENOXAPARIN SODIUM 30 MG/0.3ML IJ SOSY
30.0000 mg | PREFILLED_SYRINGE | INTRAMUSCULAR | Status: DC
  Administered 2022-04-22 – 2022-04-25 (×4): 30 mg via SUBCUTANEOUS
  Filled 2022-04-22 (×4): qty 0.3

## 2022-04-22 MED ORDER — LACTATED RINGERS IV BOLUS (SEPSIS)
250.0000 mL | Freq: Once | INTRAVENOUS | Status: AC
  Administered 2022-04-22: 250 mL via INTRAVENOUS

## 2022-04-22 MED ORDER — AMIODARONE HCL 200 MG PO TABS
100.0000 mg | ORAL_TABLET | Freq: Every day | ORAL | Status: DC
  Administered 2022-04-22 – 2022-04-25 (×4): 100 mg via ORAL
  Filled 2022-04-22 (×5): qty 1

## 2022-04-22 MED ORDER — VANCOMYCIN VARIABLE DOSE PER UNSTABLE RENAL FUNCTION (PHARMACIST DOSING): Status: DC

## 2022-04-22 MED ORDER — TRAZODONE HCL 50 MG PO TABS: 25.0000 mg | ORAL_TABLET | Freq: Every evening | ORAL | Status: DC | PRN

## 2022-04-22 MED ORDER — ACETAMINOPHEN 650 MG RE SUPP: 650.0000 mg | Freq: Four times a day (QID) | RECTAL | Status: DC | PRN

## 2022-04-22 MED ORDER — MIDODRINE HCL 5 MG PO TABS
5.0000 mg | ORAL_TABLET | Freq: Three times a day (TID) | ORAL | Status: DC
  Administered 2022-04-22 (×3): 5 mg via ORAL
  Filled 2022-04-22 (×5): qty 1

## 2022-04-22 MED ORDER — ESCITALOPRAM OXALATE 10 MG PO TABS
5.0000 mg | ORAL_TABLET | Freq: Every day | ORAL | Status: DC
  Administered 2022-04-22 – 2022-04-25 (×4): 5 mg via ORAL
  Filled 2022-04-22 (×5): qty 1

## 2022-04-22 MED ORDER — ONDANSETRON HCL 4 MG/2ML IJ SOLN: 4.0000 mg | Freq: Four times a day (QID) | INTRAMUSCULAR | Status: DC | PRN

## 2022-04-22 MED ORDER — PANTOPRAZOLE SODIUM 40 MG PO TBEC
40.0000 mg | DELAYED_RELEASE_TABLET | Freq: Every day | ORAL | Status: DC
  Administered 2022-04-22 – 2022-04-25 (×4): 40 mg via ORAL
  Filled 2022-04-22 (×5): qty 1

## 2022-04-22 MED ORDER — OXYCODONE HCL 5 MG PO TABS
5.0000 mg | ORAL_TABLET | ORAL | Status: DC | PRN
  Administered 2022-04-26: 5 mg via ORAL
  Filled 2022-04-22: qty 1

## 2022-04-22 MED ORDER — ATORVASTATIN CALCIUM 10 MG PO TABS
10.0000 mg | ORAL_TABLET | Freq: Every day | ORAL | Status: DC
  Administered 2022-04-22 – 2022-04-25 (×4): 10 mg via ORAL
  Filled 2022-04-22 (×5): qty 1

## 2022-04-22 MED ORDER — VANCOMYCIN HCL IN DEXTROSE 1-5 GM/200ML-% IV SOLN: 1000.0000 mg | INTRAVENOUS | Status: DC

## 2022-04-22 MED ORDER — SODIUM CHLORIDE 0.9% FLUSH
3.0000 mL | Freq: Two times a day (BID) | INTRAVENOUS | Status: DC
  Administered 2022-04-22 – 2022-04-27 (×8): 3 mL via INTRAVENOUS

## 2022-04-22 MED ORDER — LACTATED RINGERS IV SOLN: INTRAVENOUS | Status: AC

## 2022-04-22 MED ORDER — LACTATED RINGERS IV BOLUS (SEPSIS)
1000.0000 mL | Freq: Once | INTRAVENOUS | Status: AC
  Administered 2022-04-22: 1000 mL via INTRAVENOUS

## 2022-04-22 MED ORDER — VANCOMYCIN HCL 1500 MG/300ML IV SOLN
1500.0000 mg | Freq: Once | INTRAVENOUS | Status: AC
  Administered 2022-04-22: 1500 mg via INTRAVENOUS
  Filled 2022-04-22: qty 300

## 2022-04-22 MED ORDER — POLYETHYLENE GLYCOL 3350 17 G PO PACK: 17.0000 g | PACK | Freq: Every day | ORAL | Status: DC | PRN

## 2022-04-22 MED ORDER — ACETAMINOPHEN 325 MG PO TABS
650.0000 mg | ORAL_TABLET | Freq: Four times a day (QID) | ORAL | Status: DC | PRN
  Administered 2022-04-24: 650 mg via ORAL
  Filled 2022-04-22: qty 2

## 2022-04-22 MED ORDER — MONTELUKAST SODIUM 10 MG PO TABS
10.0000 mg | ORAL_TABLET | Freq: Every day | ORAL | Status: DC
  Administered 2022-04-22 – 2022-04-25 (×4): 10 mg via ORAL
  Filled 2022-04-22 (×5): qty 1

## 2022-04-22 NOTE — Progress Notes (Signed)
PROGRESS NOTE    Erica Donovan  TDS:[id number]  DOB: Mar 06, 1926  DOA: 04/21/2022 PCP: Juluis Pitch, MD Outpatient Specialists:   Hospital course:  88 year old female with PAF, DM2, CKD and HTN and multiple episodes of UTI in the past month treated as an outpatient is admitted yesterday with lethargy.  Work-up in the ED reveals new AKI with creatinine of 2 with a baseline of 1 and leukocytosis and UA consistent with UTI.   Subjective:  Patient lethargic and somnolent with attentive daughter at bedside.    Objective: Vitals:   04/22/22 1209 04/22/22 1230 04/22/22 1304 04/22/22 1532  BP:  134/66 134/65 (!) 135/55  Pulse:  86 82 80  Resp:  (!) '24 18 18  '$ Temp: 99.4 F (37.4 C)  98.2 F (36.8 C) 98.6 F (37 C)  TempSrc: Oral  Oral Oral  SpO2:  99% 96% 100%  Weight:      Height:       No intake or output data in the 24 hours ending 04/22/22 1658 Filed Weights   04/21/22 2017  Weight: 74.8 kg     Exam:  General: Patient is lethargic, unable to be awoken by voice alone, opens eyes to touch. Eyes: sclera anicteric, conjuctiva mild injection bilaterally CVS: S1-S2, regular  Respiratory:  decreased air entry bilaterally secondary to decreased inspiratory effort, rales at bases  GI: NABS, soft, NT  LE: No edema.  Neuro: GCS is 9, opens eyes to pressure, verbal responses sounds and motor response is localizing   Assessment & Plan:   Metabolic encephalopathy secondary to severe sepsis and complicated UTI Patient with multiple episodes UTI treated as an outpatient in the past 4 to 6 weeks Agree this is most likely secondary to recurrent complicated urinary tract infection Continue vancomycin and cefepime per pharmacy protocol Leukocytosis is improved, patient is hemodynamically stable although patient remains on midodrine per home doses  AKI Creatinine improved from 2.2-1.7 with treatment of infection and IV fluid resuscitation  Transaminitis Likely secondary  to sepsis, improved with treatment of sepsis  Atrial fibrillation Continue amiodarone Is not on anticoagulation as an outpatient  Anxiety and depression Continue Lexapro  Troponin with minimal elevation Likely demand ischemia  Patient is DNR   Scheduled Meds:  amiodarone  100 mg Oral Daily   atorvastatin  10 mg Oral Daily   enoxaparin (LOVENOX) injection  30 mg Subcutaneous Q24H   escitalopram  5 mg Oral Daily   midodrine  5 mg Oral TID with meals   montelukast  10 mg Oral QHS   pantoprazole  40 mg Oral Daily   sodium chloride flush  3 mL Intravenous Q12H   vancomycin variable dose per unstable renal function (pharmacist dosing)   Does not apply See admin instructions   Continuous Infusions:  lactated ringers 125 mL/hr at 04/22/22 1208   [START ON 04/24/2022] vancomycin      Data Reviewed:  Basic Metabolic Panel: Recent Labs  Lab 04/21/22 2020 04/22/22 0529  NA 134* 136  K 4.5 4.5  CL 99 105  CO2 22 24  GLUCOSE 159* 178*  BUN 55* 49*  CREATININE 2.12* 1.73*  CALCIUM 10.2 8.8*    CBC: Recent Labs  Lab 04/21/22 2020 04/22/22 0529  WBC 13.6* 8.8  NEUTROABS 11.8*  --   HGB 14.0 10.3*  HCT 44.2 31.8*  MCV 100.7* 99.1  PLT 227 181    Studies: CT ABDOMEN PELVIS WO CONTRAST  Result Date: 04/21/2022 CLINICAL DATA:  Altered mental  status. Elevated liver function studies. EXAM: CT ABDOMEN AND PELVIS WITHOUT CONTRAST TECHNIQUE: Multidetector CT imaging of the abdomen and pelvis was performed following the standard protocol without IV contrast. RADIATION DOSE REDUCTION: This exam was performed according to the departmental dose-optimization program which includes automated exposure control, adjustment of the mA and/or kV according to patient size and/or use of iterative reconstruction technique. COMPARISON:  10/15/2019 FINDINGS: Lower chest: Atelectasis in the lung bases.  Cardiac enlargement. Hepatobiliary: Cholelithiasis with multiple stones in the gallbladder.  No gallbladder wall thickening or infiltration. No bile duct dilatation. No focal liver lesions. Pancreas: Pancreatic atrophy with scattered calcifications consistent with chronic pancreatitis. No acute changes. Spleen: Normal in size without focal abnormality. Adrenals/Urinary Tract: No adrenal gland nodules. Bilateral renal cysts. Largest is in the lower pole of the right kidney measuring 3.9 cm diameter. No follow-up imaging is indicated. No hydronephrosis or hydroureter. Bladder is normal. Stomach/Bowel: Stomach, small bowel, and colon are not abnormally distended. No wall thickening or inflammatory changes. Diverticulosis of the sigmoid colon without evidence of acute diverticulitis. Appendix is not identified. Vascular/Lymphatic: Aortic atherosclerosis. No enlarged abdominal or pelvic lymph nodes. Reproductive: Status post hysterectomy. No adnexal masses. Other: No abdominal wall hernia or abnormality. No abdominopelvic ascites. Musculoskeletal: Degenerative changes.  No acute bony abnormalities. IMPRESSION: 1. Cholelithiasis without evidence of acute cholecystitis. 2. Changes of chronic pancreatitis.  No acute changes. 3. No evidence of bowel obstruction or inflammation. 4. Aortic atherosclerosis. Electronically Signed   By: Lucienne Capers M.D.   On: 04/21/2022 23:22   CT HEAD WO CONTRAST (5MM)  Result Date: 04/21/2022 CLINICAL DATA:  Head trauma. EXAM: CT HEAD WITHOUT CONTRAST TECHNIQUE: Contiguous axial images were obtained from the base of the skull through the vertex without intravenous contrast. RADIATION DOSE REDUCTION: This exam was performed according to the departmental dose-optimization program which includes automated exposure control, adjustment of the mA and/or kV according to patient size and/or use of iterative reconstruction technique. COMPARISON:  Head CT dated 04/10/2020. FINDINGS: Brain: Moderate age-related atrophy and chronic microvascular ischemic changes. There is no acute  intracranial hemorrhage. No mass effect or midline shift. No extra-axial fluid collection. Vascular: No hyperdense vessel or unexpected calcification. Skull: No acute calvarial pathology. Sinuses/Orbits: Partial opacification of the right maxillary sinus which may represent a retention cyst or polyp. No air-fluid level. The mastoid air cells are clear. Other: None IMPRESSION: 1. No acute intracranial pathology. 2. Moderate age-related atrophy and chronic microvascular ischemic changes. Electronically Signed   By: Anner Crete M.D.   On: 04/21/2022 22:30   DG Chest 2 View  Result Date: 04/21/2022 CLINICAL DATA:  Urinary tract infection EXAM: CHEST - 2 VIEW COMPARISON:  04/10/2020 FINDINGS: Lungs are well expanded, symmetric, and clear. No pneumothorax or pleural effusion. Cardiac size within normal limits. Pulmonary vascularity is normal. Osseous structures are age-appropriate. No acute bone abnormality. IMPRESSION: No active cardiopulmonary disease. Electronically Signed   By: Fidela Salisbury M.D.   On: 04/21/2022 21:29    Principal Problem:   Severe sepsis ([place]) Active Problems:   Acute encephalopathy   Benign essential hypertension   Chronic kidney disease   Diabetes mellitus type 2, uncomplicated (HCC)   Paroxysmal atrial fibrillation (HCC)   Transaminitis   Hyperbilirubinemia   AKI (acute kidney injury) ([place])   Elevated troponin   Known medical problems     Kahlyn Shippey Derek Jack, [facility]  If 7PM-7AM, please contact night-coverage [email or web address]   LOS: 1 day

## 2022-04-22 NOTE — Assessment & Plan Note (Signed)
Suspect secondary to acute infectious process, continue to monitor.

## 2022-04-22 NOTE — Progress Notes (Signed)
       CROSS COVER NOTE  NAME: Erica Donovan MRN: [id number] DOB : [birth year] ATTENDING PHYSICIAN: Clarnce Flock, MD    Date of Service   04/22/2022   HPI/Events of Note   Message received from RN requesting tele sitter for confusion, pulling at lines, and patient being a falls risk.  Interventions   Assessment/Plan:  Tele-sitter     This document was prepared using Dragon voice recognition software and may include unintentional dictation errors.  Neomia Glass DNP, MBA, FNP-BC Nurse Practitioner [facility] Pager [phone]

## 2022-04-22 NOTE — ED Notes (Signed)
Iv team at bedside attempting iv insertion

## 2022-04-22 NOTE — Assessment & Plan Note (Signed)
Patient meets criteria for severe sepsis based on vital sign abnormalities and lab abnormalities. - Vancomycin and cefepime per pharmacy protocol - Follow-up blood and urine cultures

## 2022-04-22 NOTE — ED Notes (Signed)
ED TO INPATIENT HANDOFF REPORT  ED Nurse Name and Phone #: Baxter Flattery, RN  S Name/Age/Gender Kalman Drape 88 y.o. female Room/Bed: ED09A/ED09A  Code Status   Code Status: DNR  Home/SNF/Other Home Patient oriented to: self Is this baseline? Yes   Triage Complete: Triage complete  Chief Complaint Severe sepsis ([place]) [A41.9, R65.20]  Triage Note Pt BIB EMS for AMS that started today. Pt recently treated for UTIx2. Per family, pt has had more weakness, decreased appetite, and more confused. Pt has dementia.    155/63 97 94% RA CBG 171   Allergies Allergies  Allergen Reactions   Penicillins Nausea Only    Did it involve swelling of the face/tongue/throat, SOB, or low BP? Unknown Did it involve sudden or severe rash/hives, skin peeling, or any reaction on the inside of your mouth or nose? Unknown Did you need to seek medical attention at a hospital or doctor's office? Unknown When did it last happen?  Many years ago.  If all above answers are "NO", may proceed with cephalosporin use.    Ciprofloxacin Nausea Only   Nitrofurantoin Itching and Rash    Level of Care/Admitting Diagnosis ED Disposition     ED Disposition  Admit   Condition  --   Comment  Hospital Area: [facility] [[id number]]  Level of Care: Med-Surg [16]  Covid Evaluation: Asymptomatic - no recent exposure (last 10 days) testing not required  Diagnosis: Severe sepsis [facility]) [[id number]]  Admitting Physician: Clarnce Flock [[id number]]  Attending Physician: Clarnce Flock [[id number]]  Certification:: I certify this patient will need inpatient services for at least 2 midnights  Estimated Length of Stay: 3          B Medical/Surgery History Past Medical History:  Diagnosis Date   Arthritis    Cancer ([place])    skin cancer   COPD (chronic obstructive pulmonary disease) ([place])    mild- no inhalers   Dementia ([place])    Depression    Diabetes mellitus without complication ([place])     Eye abnormality    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-[facility] keeps check on this   GERD (gastroesophageal reflux disease)    Hemorrhoids    History of kidney stones    Hyperlipidemia    Hypertension    Leaky heart valve    Paroxysmal SVT (supraventricular tachycardia) ([place])    Past Surgical History:  Procedure Laterality Date   ABDOMINAL HYSTERECTOMY     APPENDECTOMY  1945   EYE SURGERY     cataract bil   HEMORRHOID SURGERY     JOINT REPLACEMENT Left 2008   ORIF WRIST FRACTURE Right 11/06/2018   Procedure: OPEN REDUCTION INTERNAL FIXATION (ORIF) WRIST FRACTURE RIGHT - DIABETIC, SLEEP APNEA;  Surgeon: Hessie Knows, MD;  Location: [facility] ORS;  Service: Orthopedics;  Laterality: Right;     A IV Location/Drains/Wounds Patient Lines/Drains/Airways Status     Active Line/Drains/Airways     Name Placement date Placement time Site Days   Peripheral IV 04/21/22 22 G Anterior;Proximal;Right Forearm 04/21/22  2150  Forearm  1   Peripheral IV 04/22/22 20 G 1.88" Left;Lateral Forearm 04/22/22  0035  Forearm  less than 1   Incision (Closed) 11/06/18 Wrist Right 11/06/18  1452  -- 1263   Wound / Incision (Open or Dehisced) 04/11/20 Non-pressure wound Buttocks partial thickness shear injury to inner gluteal fold 04/11/20  --  Buttocks  741  Intake/Output Last 24 hours No intake or output data in the 24 hours ending 04/22/22 1200  Labs/Imaging Results for orders placed or performed during the hospital encounter of 04/21/22 (from the past 48 hour(s))  Lactic acid, plasma     Status: None   Collection Time: 04/21/22  8:20 PM  Result Value Ref Range   Lactic Acid, Venous 1.5 0.5 - 1.9 mmol/L    Comment: Performed at [facility], [address], [place],  [address]  Comprehensive metabolic panel     Status: Abnormal   Collection Time: 04/21/22  8:20 PM  Result Value Ref Range   Sodium 134 (L) 135 - 145 mmol/L   Potassium 4.5 3.5 - 5.1  mmol/L   Chloride 99 98 - 111 mmol/L   CO2 22 22 - 32 mmol/L   Glucose, Bld 159 (H) 70 - 99 mg/dL    Comment: Glucose reference range applies only to samples taken after fasting for at least 8 hours.   BUN 55 (H) 8 - 23 mg/dL   Creatinine, Ser 2.12 (H) 0.44 - 1.00 mg/dL   Calcium 10.2 8.9 - 10.3 mg/dL   Total Protein 7.5 6.5 - 8.1 g/dL   Albumin 3.9 3.5 - 5.0 g/dL   AST 270 (H) 15 - 41 U/L   ALT 396 (H) 0 - 44 U/L   Alkaline Phosphatase 161 (H) 38 - 126 U/L   Total Bilirubin 1.9 (H) 0.3 - 1.2 mg/dL   GFR, Estimated 21 (L) >60 mL/min    Comment: (NOTE) Calculated using the CKD-EPI Creatinine Equation (2021)    Anion gap 13 5 - 15    Comment: Performed at [facility], [address] [place],  [address]  CBC with Differential     Status: Abnormal   Collection Time: 04/21/22  8:20 PM  Result Value Ref Range   WBC 13.6 (H) 4.0 - 10.5 K/uL   RBC 4.39 3.87 - 5.11 MIL/uL   Hemoglobin 14.0 12.0 - 15.0 g/dL   HCT 44.2 36.0 - 46.0 %   MCV 100.7 (H) 80.0 - 100.0 fL   MCH 31.9 26.0 - 34.0 pg   MCHC 31.7 30.0 - 36.0 g/dL   RDW 13.4 11.5 - 15.5 %   Platelets 227 150 - 400 K/uL   nRBC 0.0 0.0 - 0.2 %   Neutrophils Relative % 87 %   Neutro Abs 11.8 (H) 1.7 - 7.7 K/uL   Lymphocytes Relative 6 %   Lymphs Abs 0.8 0.7 - 4.0 K/uL   Monocytes Relative 3 %   Monocytes Absolute 0.4 0.1 - 1.0 K/uL   Eosinophils Relative 3 %   Eosinophils Absolute 0.4 0.0 - 0.5 K/uL   Basophils Relative 0 %   Basophils Absolute 0.0 0.0 - 0.1 K/uL   Immature Granulocytes 1 %   Abs Immature Granulocytes 0.08 (H) 0.00 - 0.07 K/uL    Comment: Performed at [facility], [place], [place] [address]  Troponin I (High Sensitivity)     Status: Abnormal   Collection Time: 04/21/22  9:41 PM  Result Value Ref Range   Troponin I (High Sensitivity) 25 (H) <18 ng/L    Comment: (NOTE) Elevated high sensitivity troponin I (hsTnI) values and significant  changes across serial  measurements may suggest ACS but many other  chronic and acute conditions are known to elevate hsTnI results.  Refer to the "Links" section for chest pain algorithms and additional  guidance. Performed at [facility], [address], [place], [place] [address]   Lactic acid, plasma     Status: None   Collection Time: 04/21/22  9:57 PM  Result Value Ref Range   Lactic Acid, Venous 1.8 0.5 - 1.9 mmol/L    Comment: Performed at [facility], [address] [place], [place] [address]  Blood culture (routine x 2)     Status: None (Preliminary result)   Collection Time: 04/21/22  9:58 PM   Specimen: BLOOD  Result Value Ref Range   Specimen Description BLOOD BLOOD RIGHT FOREARM    Special Requests      BOTTLES DRAWN AEROBIC AND ANAEROBIC Blood Culture adequate volume   Culture      NO GROWTH < 12 HOURS Performed at [facility], [address] [place], [place] [address]    Report Status PENDING   Urinalysis, Routine w reflex microscopic     Status: Abnormal   Collection Time: 04/22/22 12:46 AM  Result Value Ref Range   Color, Urine AMBER (A) YELLOW    Comment: BIOCHEMICALS MAY BE AFFECTED BY COLOR   APPearance CLOUDY (A) CLEAR   Specific Gravity, Urine 1.021 1.005 - 1.030   pH 5.0 5.0 - 8.0   Glucose, UA NEGATIVE NEGATIVE mg/dL   Hgb urine dipstick NEGATIVE NEGATIVE   Bilirubin Urine NEGATIVE NEGATIVE   Ketones, ur NEGATIVE NEGATIVE mg/dL   Protein, ur 100 (A) NEGATIVE mg/dL   Nitrite NEGATIVE NEGATIVE   Leukocytes,Ua SMALL (A) NEGATIVE   RBC / HPF 0-5 0 - 5 RBC/hpf   WBC, UA 11-20 0 - 5 WBC/hpf   Bacteria, UA NONE SEEN NONE SEEN   Squamous Epithelial / LPF 21-50 0 - 5    Comment: Performed at [facility], [place], [place] [address]  Troponin I (High Sensitivity)     Status: Abnormal   Collection Time: 04/22/22 12:46 AM  Result Value Ref Range   Troponin I (High Sensitivity) 23 (H) <18 ng/L    Comment:  (NOTE) Elevated high sensitivity troponin I (hsTnI) values and significant  changes across serial measurements may suggest ACS but many other  chronic and acute conditions are known to elevate hsTnI results.  Refer to the "Links" section for chest pain algorithms and additional  guidance. Performed at [facility], [address] [place], [place] [address]   Blood culture (routine x 2)     Status: None (Preliminary result)   Collection Time: 04/22/22 12:46 AM   Specimen: BLOOD LEFT ARM  Result Value Ref Range   Specimen Description BLOOD LEFT ARM    Special Requests      BOTTLES DRAWN AEROBIC AND ANAEROBIC Blood Culture adequate volume   Culture      NO GROWTH < 12 HOURS Performed at [facility], [address], [place], [place] [address]    Report Status PENDING   Procalcitonin     Status: None   Collection Time: 04/22/22  5:29 AM  Result Value Ref Range   Procalcitonin 0.78 ng/mL    Comment:        Interpretation: PCT > 0.5 ng/mL and <= 2 ng/mL: Systemic infection (sepsis) is possible, but other conditions are known to elevate PCT as well. (NOTE)       Sepsis PCT Algorithm           Lower Respiratory Tract  Infection PCT Algorithm    ----------------------------     ----------------------------         PCT < 0.25 ng/mL                PCT < 0.10 ng/mL          Strongly encourage             Strongly discourage   discontinuation of antibiotics    initiation of antibiotics    ----------------------------     -----------------------------       PCT 0.25 - 0.50 ng/mL            PCT 0.10 - 0.25 ng/mL               OR       >80% decrease in PCT            Discourage initiation of                                            antibiotics      Encourage discontinuation           of antibiotics    ----------------------------     -----------------------------         PCT >= 0.50 ng/mL              PCT 0.26 - 0.50 ng/mL                 AND       <80% decrease in PCT             Encourage initiation of                                             antibiotics       Encourage continuation           of antibiotics    ----------------------------     -----------------------------        PCT >= 0.50 ng/mL                  PCT > 0.50 ng/mL               AND         increase in PCT                  Strongly encourage                                      initiation of antibiotics    Strongly encourage escalation           of antibiotics                                     -----------------------------                                           PCT <= 0.25 ng/mL  OR                                        > 80% decrease in PCT                                      Discontinue / Do not initiate                                             antibiotics  Performed at [facility], [address] [place], [place] [address]   Comprehensive metabolic panel     Status: Abnormal   Collection Time: 04/22/22  5:29 AM  Result Value Ref Range   Sodium 136 135 - 145 mmol/L   Potassium 4.5 3.5 - 5.1 mmol/L    Comment: HEMOLYSIS AT THIS LEVEL MAY AFFECT RESULT   Chloride 105 98 - 111 mmol/L   CO2 24 22 - 32 mmol/L   Glucose, Bld 178 (H) 70 - 99 mg/dL    Comment: Glucose reference range applies only to samples taken after fasting for at least 8 hours.   BUN 49 (H) 8 - 23 mg/dL   Creatinine, Ser 1.73 (H) 0.44 - 1.00 mg/dL   Calcium 8.8 (L) 8.9 - 10.3 mg/dL   Total Protein 5.1 (L) 6.5 - 8.1 g/dL   Albumin 2.6 (L) 3.5 - 5.0 g/dL   AST 136 (H) 15 - 41 U/L   ALT 232 (H) 0 - 44 U/L   Alkaline Phosphatase 111 38 - 126 U/L   Total Bilirubin 1.6 (H) 0.3 - 1.2 mg/dL   GFR, Estimated 27 (L) >60 mL/min    Comment: (NOTE) Calculated using the CKD-EPI Creatinine Equation (2021)    Anion gap 7 5 - 15    Comment: Performed at [facility], [address]  [place], [place] [address]  CBC     Status: Abnormal   Collection Time: 04/22/22  5:29 AM  Result Value Ref Range   WBC 8.8 4.0 - 10.5 K/uL   RBC 3.21 (L) 3.87 - 5.11 MIL/uL   Hemoglobin 10.3 (L) 12.0 - 15.0 g/dL    Comment: REPEATED TO VERIFY   HCT 31.8 (L) 36.0 - 46.0 %   MCV 99.1 80.0 - 100.0 fL   MCH 32.1 26.0 - 34.0 pg   MCHC 32.4 30.0 - 36.0 g/dL   RDW 13.5 11.5 - 15.5 %   Platelets 181 150 - 400 K/uL   nRBC 0.0 0.0 - 0.2 %    Comment: Performed at [facility], [address] [place], [place] [address]   CT ABDOMEN PELVIS WO CONTRAST  Result Date: 04/21/2022 CLINICAL DATA:  Altered mental status. Elevated liver function studies. EXAM: CT ABDOMEN AND PELVIS WITHOUT CONTRAST TECHNIQUE: Multidetector CT imaging of the abdomen and pelvis was performed following the standard protocol without IV contrast. RADIATION DOSE REDUCTION: This exam was performed according to the departmental dose-optimization program which includes automated exposure control, adjustment of the mA and/or kV according to patient size and/or use of iterative reconstruction technique. COMPARISON:  10/15/2019 FINDINGS: Lower chest: Atelectasis in the lung bases.  Cardiac enlargement. Hepatobiliary: Cholelithiasis with multiple stones in the gallbladder.  No gallbladder wall thickening or infiltration. No bile duct dilatation. No focal liver lesions. Pancreas: Pancreatic atrophy with scattered calcifications consistent with chronic pancreatitis. No acute changes. Spleen: Normal in size without focal abnormality. Adrenals/Urinary Tract: No adrenal gland nodules. Bilateral renal cysts. Largest is in the lower pole of the right kidney measuring 3.9 cm diameter. No follow-up imaging is indicated. No hydronephrosis or hydroureter. Bladder is normal. Stomach/Bowel: Stomach, small bowel, and colon are not abnormally distended. No wall thickening or inflammatory changes. Diverticulosis of the sigmoid colon without evidence of  acute diverticulitis. Appendix is not identified. Vascular/Lymphatic: Aortic atherosclerosis. No enlarged abdominal or pelvic lymph nodes. Reproductive: Status post hysterectomy. No adnexal masses. Other: No abdominal wall hernia or abnormality. No abdominopelvic ascites. Musculoskeletal: Degenerative changes.  No acute bony abnormalities. IMPRESSION: 1. Cholelithiasis without evidence of acute cholecystitis. 2. Changes of chronic pancreatitis.  No acute changes. 3. No evidence of bowel obstruction or inflammation. 4. Aortic atherosclerosis. Electronically Signed   By: Lucienne Capers M.D.   On: 04/21/2022 23:22   CT HEAD WO CONTRAST (5MM)  Result Date: 04/21/2022 CLINICAL DATA:  Head trauma. EXAM: CT HEAD WITHOUT CONTRAST TECHNIQUE: Contiguous axial images were obtained from the base of the skull through the vertex without intravenous contrast. RADIATION DOSE REDUCTION: This exam was performed according to the departmental dose-optimization program which includes automated exposure control, adjustment of the mA and/or kV according to patient size and/or use of iterative reconstruction technique. COMPARISON:  Head CT dated 04/10/2020. FINDINGS: Brain: Moderate age-related atrophy and chronic microvascular ischemic changes. There is no acute intracranial hemorrhage. No mass effect or midline shift. No extra-axial fluid collection. Vascular: No hyperdense vessel or unexpected calcification. Skull: No acute calvarial pathology. Sinuses/Orbits: Partial opacification of the right maxillary sinus which may represent a retention cyst or polyp. No air-fluid level. The mastoid air cells are clear. Other: None IMPRESSION: 1. No acute intracranial pathology. 2. Moderate age-related atrophy and chronic microvascular ischemic changes. Electronically Signed   By: Anner Crete M.D.   On: 04/21/2022 22:30   DG Chest 2 View  Result Date: 04/21/2022 CLINICAL DATA:  Urinary tract infection EXAM: CHEST - 2 VIEW  COMPARISON:  04/10/2020 FINDINGS: Lungs are well expanded, symmetric, and clear. No pneumothorax or pleural effusion. Cardiac size within normal limits. Pulmonary vascularity is normal. Osseous structures are age-appropriate. No acute bone abnormality. IMPRESSION: No active cardiopulmonary disease. Electronically Signed   By: Fidela Salisbury M.D.   On: 04/21/2022 21:29    Pending Labs Unresulted Labs (From admission, onward)     Start     Ordered   04/29/22 0500  Creatinine, serum  (enoxaparin (LOVENOX)    CrCl >/= 30 ml/min)  Weekly,   R     Comments: while on enoxaparin therapy    04/22/22 0057            Vitals/Pain Today's Vitals   04/22/22 0830 04/22/22 0900 04/22/22 0930 04/22/22 1000  BP: 110/60 125/62 (!) 144/66 (!) 138/59  Pulse: 88 94 95 94  Resp: 18 18 (!) 21 20  Temp:      TempSrc:      SpO2: 98% 95% 100% 96%  Weight:      Height:      PainSc:        Isolation Precautions No active isolations  Medications Medications  amiodarone (PACERONE) tablet 100 mg (100 mg Oral Given 04/22/22 0916)  atorvastatin (LIPITOR) tablet 10 mg (10 mg Oral Given 04/22/22 0916)  midodrine (PROAMATINE) tablet  5 mg (5 mg Oral Given 04/22/22 0916)  escitalopram (LEXAPRO) tablet 5 mg (5 mg Oral Given 04/22/22 0917)  pantoprazole (PROTONIX) EC tablet 40 mg (40 mg Oral Given 04/22/22 0918)  montelukast (SINGULAIR) tablet 10 mg (has no administration in time range)  enoxaparin (LOVENOX) injection 30 mg (30 mg Subcutaneous Given 04/22/22 0920)  sodium chloride flush (NS) 0.9 % injection 3 mL (3 mLs Intravenous Given 04/22/22 0920)  acetaminophen (TYLENOL) tablet 650 mg (has no administration in time range)    Or  acetaminophen (TYLENOL) suppository 650 mg (has no administration in time range)  oxyCODONE (Oxy IR/ROXICODONE) immediate release tablet 5 mg (has no administration in time range)  traZODone (DESYREL) tablet 25 mg (has no administration in time range)  polyethylene glycol  (MIRALAX / GLYCOLAX) packet 17 g (has no administration in time range)  ondansetron (ZOFRAN) tablet 4 mg (has no administration in time range)    Or  ondansetron (ZOFRAN) injection 4 mg (has no administration in time range)  vancomycin variable dose per unstable renal function (pharmacist dosing) (has no administration in time range)  vancomycin (VANCOCIN) IVPB 1000 mg/200 mL premix (has no administration in time range)  lactated ringers infusion (has no administration in time range)  ceFEPIme (MAXIPIME) 2 g in sodium chloride 0.9 % 100 mL IVPB (0 g Intravenous Stopped 04/22/22 0314)  lactated ringers bolus 1,000 mL (0 mLs Intravenous Stopped 04/22/22 0314)    And  lactated ringers bolus 1,000 mL (0 mLs Intravenous Stopped 04/22/22 0528)    And  lactated ringers bolus 250 mL (0 mLs Intravenous Stopped 04/22/22 0551)  vancomycin (VANCOREADY) IVPB 1500 mg/300 mL (0 mg Intravenous Stopped 04/22/22 0551)    Mobility walks with device High fall risk   Focused Assessments Cardiac Assessment Handoff:    Lab Results  Component Value Date   CKTOTAL 29 03/02/2013   CKMB < 0.5 (L) 03/02/2013   TROPONINI < 0.02 03/02/2013   No results found for: "DDIMER" Does the Patient currently have chest pain? No     Recommendations: See Admitting Provider Note  Report given to:   Additional Notes: Family at bedside

## 2022-04-22 NOTE — ED Notes (Signed)
Iv team remains at bedside attempting iv insertion

## 2022-04-22 NOTE — Progress Notes (Signed)
Pharmacy Antibiotic Note  Erica Donovan is a 88 y.o. female admitted on 04/21/2022 with sepsis.  Pharmacy has been consulted for Cefepime & Vancomycin dosing.  Plan: Cefepime 2 gm q24hr per indication & renal fxn.  Pt given Vancomycin 1500 mg once. AUC dosing interval currently > 36 hr.  Variable vancomycin order place at this time.  Pharmacy will continue to follow and will adjust abx dosing whenever warranted.  Temp (24hrs), Avg:98.2 F (36.8 C), Min:98 F (36.7 C), Max:98.3 F (36.8 C)   Recent Labs  Lab 04/21/22 2020 04/21/22 2157  WBC 13.6*  --   CREATININE 2.12*  --   LATICACIDVEN 1.5 1.8    Estimated Creatinine Clearance: 15.7 mL/min (A) (by C-G formula based on SCr of 2.12 mg/dL (H)).    Allergies  Allergen Reactions   Penicillins Nausea Only    Did it involve swelling of the face/tongue/throat, SOB, or low BP? Unknown Did it involve sudden or severe rash/hives, skin peeling, or any reaction on the inside of your mouth or nose? Unknown Did you need to seek medical attention at a hospital or doctor's office? Unknown When did it last happen?  Many years ago.  If all above answers are "NO", may proceed with cephalosporin use.    Ciprofloxacin Nausea Only   Nitrofurantoin Itching and Rash    Antimicrobials this admission: 10/22 Cefepime >>  10/22 Vancomycin >>   Microbiology results: 10/22 BCx: Pending  Thank you for allowing pharmacy to be a part of this patient's care.  Renda Rolls, PharmD, MBA 04/22/2022 1:10 AM

## 2022-04-22 NOTE — Progress Notes (Signed)
Pharmacy Antibiotic Note  Erica Donovan is a 88 y.o. female admitted on 04/21/2022 with sepsis.  Pharmacy has been consulted for Cefepime & Vancomycin dosing.  Plan: Cefepime 2 gm q24hr per indication & renal fxn.  Pt given Vancomycin 1500 mg once as loading dose on 04/22/22@~0400 Vancomycin 1000 mg IV Q 48 hrs. Goal AUC 400-550. Expected AUC: 514.3 Expected Cmin: 13.7 SCr used: 1.73, Vd used: 0/72    Pharmacy will continue to follow and will adjust abx dosing whenever warranted.  Temp (24hrs), Avg:98.6 F (37 C), Min:98 F (36.7 C), Max:99.5 F (37.5 C)   Recent Labs  Lab 04/21/22 2020 04/21/22 2157 04/22/22 0529  WBC 13.6*  --  8.8  CREATININE 2.12*  --  1.73*  LATICACIDVEN 1.5 1.8  --      Estimated Creatinine Clearance: 19.3 mL/min (A) (by C-G formula based on SCr of 1.73 mg/dL (H)).    Allergies  Allergen Reactions   Penicillins Nausea Only    Did it involve swelling of the face/tongue/throat, SOB, or low BP? Unknown Did it involve sudden or severe rash/hives, skin peeling, or any reaction on the inside of your mouth or nose? Unknown Did you need to seek medical attention at a hospital or doctor's office? Unknown When did it last happen?  Many years ago.  If all above answers are "NO", may proceed with cephalosporin use.    Ciprofloxacin Nausea Only   Nitrofurantoin Itching and Rash    Antimicrobials this admission: 10/22 Cefepime >>  10/22 Vancomycin >>   Microbiology results: 10/22 BCx: NGTD  Thank you for allowing pharmacy to be a part of this patient's care.  Pearla Dubonnet, PharmD Clinical Pharmacist 04/22/2022 10:43 AM

## 2022-04-22 NOTE — Progress Notes (Signed)
PHARMACIST - PHYSICIAN COMMUNICATION  CONCERNING:  Enoxaparin (Lovenox) for DVT Prophylaxis    RECOMMENDATION: Patient was prescribed enoxaprin '40mg'$  q24 hours for VTE prophylaxis.   Filed Weights   04/21/22 2017  Weight: 74.8 kg (165 lb)    Body mass index is 29.23 kg/m.  Estimated Creatinine Clearance: 15.7 mL/min (A) (by C-G formula based on SCr of 2.12 mg/dL (H)).  Patient is candidate for enoxaparin '30mg'$  every 24 hours based on CrCl <44m/min or Weight <45kg  DESCRIPTION: Pharmacy has [facility]arin dose [facility].  Patient is now receiving enoxaparin 30 mg every 24 hours   NRenda Rolls PharmD, MEndoscopy Center Of Northern [facility] LLC10/22/2023 1:15 AM

## 2022-04-22 NOTE — H&P (Addendum)
History and Physical    Patient: Erica Donovan MOQ:[id number] DOB: 1926-03-25 DOA: 04/21/2022 DOS: the patient was seen and examined on 04/22/2022 PCP: Juluis Pitch, MD  Patient coming from: Home  Chief Complaint:  Chief Complaint  Patient presents with   Altered Mental Status   HPI: Erica Donovan is a 88 y.o. female with medical history significant of paroxysmal A-fib, type 2 diabetes, CKD, hypertension, who presents with lethargy.  Patient unable to engage in history taking in any meaningful way.  Majority of history obtained from her daughter Erica Donovan who is at bedside.  Starting earlier today in the morning patient began to act not like herself.  She appeared very sluggish to her daughter and her daughter's husband, was not responding to questions they were asking her, and seemed very tired.  This afternoon they became concerned enough that they called EMS to bring her here for an evaluation.  She has had similar presentations in the past which have been attributed to a UTI.  In addition patient has been treated twice as an outpatient in the month of October for UTI by her primary care doctor.  At baseline she lives by herself and is able to do most of her ADLs with assistance from her adult children.  In the ED vital signs notable for mild hypertension and a brief episode of atrial fibrillation, this resolved.  Remainder vital signs unremarkable.  CMP notable for creatinine of 2.1, her baseline is 1.1, in addition to elevated transaminases and bilirubin with AST 270, ALT 396, and T. bili 1.9 which had been previously normal.  CBC shows elevated white count at 13.6, remainder unremarkable.  Serial lactic acids were normal.  Troponin was borderline elevated at 25, repeat pending.  Chest x-ray was unremarkable.  CT head was unremarkable.  CT abdomen and pelvis showed no acute findings.  EKG showed previously seen right bundle branch block as well as new left anterior fascicular block with  some EKG changes although no acute ischemic changes were seen.  Urine and blood cultures were ordered, she was ordered for 1 L LR bolus, and started on broad-spectrum antibiotics.   Review of Systems: As mentioned in the history of present illness. All other systems reviewed and are negative. 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-[facility] keeps check on this   GERD (gastroesophageal reflux disease)    Hemorrhoids    History of kidney stones    Hyperlipidemia    Hypertension    Leaky heart valve    Paroxysmal SVT (supraventricular tachycardia) ([place])    Past Surgical History:  Procedure Laterality Date   ABDOMINAL HYSTERECTOMY     APPENDECTOMY  1945   EYE SURGERY     cataract bil   HEMORRHOID SURGERY     JOINT REPLACEMENT Left 2008   ORIF WRIST FRACTURE Right 11/06/2018   Procedure: OPEN REDUCTION INTERNAL FIXATION (ORIF) WRIST FRACTURE RIGHT - DIABETIC, SLEEP APNEA;  Surgeon: Hessie Knows, MD;  Location: [facility] ORS;  Service: Orthopedics;  Laterality: Right;   Social History:  reports that she quit smoking about 48 years ago. Her smoking use included cigarettes. She has never used smokeless  tobacco. She reports that she does not drink alcohol and does not use drugs.  Allergies  Allergen Reactions   Penicillins Nausea Only    Did it involve swelling of the face/tongue/throat, SOB, or low BP? Unknown Did it involve sudden or severe rash/hives, skin peeling, or any reaction on the inside of your mouth or nose? Unknown Did you need to seek medical attention at a hospital or doctor's office? Unknown When did it last happen?  Many years ago.  If all above answers are "NO", may proceed with cephalosporin use.     Ciprofloxacin Nausea Only   Nitrofurantoin Itching and Rash    No family history on file.  Prior to Admission medications   Medication Sig Start Date End Date Taking? Authorizing Provider  amiodarone (PACERONE) 200 MG tablet TAKE 1/2 TABLET(100 MG) BY MOUTH EVERY DAY 11/03/21  Yes [provider]  atorvastatin (LIPITOR) 10 MG tablet Take 1 tablet (10 mg total) by mouth daily. 04/17/20 04/21/22 Yes Hall, Carole N, DO  escitalopram (LEXAPRO) 5 MG tablet Take 5 mg by mouth daily. 04/08/22  Yes [provider]  ketoconazole (NIZORAL) 2 % shampoo Shampoo into scalp let sit 5-10 minutes then wash out. Use QW. 12/06/21  Yes Brendolyn Patty, MD  midodrine (PROAMATINE) 5 MG tablet Take 5 mg by mouth 3 (three) times daily. 02/28/22  Yes [provider]  montelukast (SINGULAIR) 10 MG tablet Take 1 tablet (10 mg total) by mouth at bedtime. Patient taking differently: Take 10 mg by mouth as needed. 04/15/20 04/21/22 Yes Kayleen Memos, DO  Multiple Vitamin (MULTIVITAMIN WITH MINERALS) TABS tablet Take 1 tablet by mouth daily.   Yes [provider]  omeprazole (PRILOSEC) 20 MG capsule Take 20 mg by mouth 2 (two) times daily. 04/15/22  Yes [provider]  sulfamethoxazole-trimethoprim (BACTRIM DS) 800-160 MG tablet Take 1 tablet by mouth 2 (two) times daily. 04/19/22  Yes [provider]  acetaminophen (TYLENOL) 500 MG tablet Take 500-1,000 mg by mouth every 6 (six) hours as needed for mild pain.    [provider]  loratadine (CLARITIN) 10 MG tablet Take 10 mg by mouth as needed.    [provider]    Physical Exam: Vitals:   04/21/22 2017 04/21/22 2230 04/21/22 2232 04/22/22 0100  BP:  116/66    Pulse:  (!) 136    Resp:  (!) 22    Temp:   98 F (36.7 C)   TempSrc:   Oral   SpO2:  92%    Weight: 74.8 kg     Height:    '5\' 3"'$  (1.6 m)   Physical Exam Vitals reviewed.  Constitutional:      Appearance: She is ill-appearing. She is  not diaphoretic.  HENT:     Head: Normocephalic and atraumatic.     Mouth/Throat:     Mouth: Mucous membranes are dry.  Cardiovascular:     Rate and Rhythm: Regular rhythm. Tachycardia present.     Heart sounds: Murmur heard.     Comments: 1/6 systolic murmur Pulmonary:     Effort: No respiratory distress.     Breath sounds: Normal breath sounds.     Comments: Normal breath sounds but limited exam Abdominal:     General: Abdomen is flat. There is no distension.     Palpations: Abdomen is soft. There is no mass.     Tenderness: There is no abdominal tenderness.  Skin:    General: Skin is warm and  dry.  Neurological:     Mental Status: She is disoriented.      Data Reviewed:  ECG (my read): sinus rhythm, RBBB and what appears to be LAFB with distinct R wave progression compared to prior. No ST segment changes.   Assessment and Plan:  Erica Donovan is a 88 y.o. female with medical history significant of paroxysmal A-fib, type 2 diabetes, CKD, hypertension, who presents with lethargy.  * Severe sepsis [facility]) Patient meets criteria for severe sepsis based on vital sign abnormalities and lab abnormalities. - Vancomycin and cefepime per pharmacy protocol - Follow-up blood and urine cultures  Known medical problems A-fib-continue amiodarone, does not appear to be on anticoagulation Hyperlipidemia-continue atorvastatin Anxiety/depression-continue Lexapro Hypotension-continue midodrine 5 mg 3 times daily GERD-continue PPI  Elevated troponin Suspect this is secondary to hypovolemia, mild demand, and poor clearance in setting of AKI.  That being said her EKG does demonstrate some changes compared to prior with new left anterior fascicular block.  Despite this low suspicion for ACS. - Trend troponin to peak - Echocardiogram in a.m.  AKI (acute kidney injury) ([place]) Suspect secondary to hypovolemia and sepsis, appears dry on exam, trend CMP.  Hyperbilirubinemia Suspect  secondary to dehydration and sepsis, trend CMP.  Transaminitis Suspect secondary to hypovolemia and sepsis.  Appears dry on exam, trend CMP  Acute encephalopathy Suspect secondary to acute infectious process, continue to monitor.     Advance Care Planning:   Code Status: DNR   Consults: None  Family Communication: Daughter updated at bedside  Severity of Illness: The appropriate patient status for this patient is INPATIENT. Inpatient status is judged to be reasonable and necessary in order to provide the required intensity of service to ensure the patient's safety. The patient's presenting symptoms, physical exam findings, and initial radiographic and laboratory data in the context of their chronic comorbidities is felt to place them at high risk for further clinical deterioration. Furthermore, it is not anticipated that the patient will be medically stable for discharge from the hospital within 2 midnights of admission.   * I certify that at the point of admission it is my clinical judgment that the patient will require inpatient hospital care spanning beyond 2 midnights from the point of admission due to high intensity of service, high risk for further deterioration and high frequency of surveillance required.*  Author: Clarnce Flock, MD 04/22/2022 1:04 AM  For on call review [email or web address].

## 2022-04-22 NOTE — Assessment & Plan Note (Signed)
Suspect secondary to dehydration and sepsis, trend CMP.

## 2022-04-22 NOTE — Assessment & Plan Note (Signed)
A-fib-continue amiodarone, does not appear to be on anticoagulation Hyperlipidemia-continue atorvastatin Anxiety/depression-continue Lexapro Hypotension-continue midodrine 5 mg 3 times daily GERD-continue PPI

## 2022-04-22 NOTE — Assessment & Plan Note (Signed)
Suspect secondary to hypovolemia and sepsis.  Appears dry on exam, trend CMP

## 2022-04-22 NOTE — ED Notes (Signed)
Advised nurse that patient has ready bed 

## 2022-04-22 NOTE — Assessment & Plan Note (Signed)
Suspect this is secondary to hypovolemia, mild demand, and poor clearance in setting of AKI.  That being said her EKG does demonstrate some changes compared to prior with new left anterior fascicular block.  Despite this low suspicion for ACS. - Trend troponin to peak - Echocardiogram in a.m.

## 2022-04-22 NOTE — Assessment & Plan Note (Signed)
Suspect secondary to hypovolemia and sepsis, appears dry on exam, trend CMP.

## 2022-04-23 DIAGNOSIS — R6520 Severe sepsis without septic shock: Secondary | ICD-10-CM | POA: Diagnosis not present

## 2022-04-23 DIAGNOSIS — A419 Sepsis, unspecified organism: Secondary | ICD-10-CM | POA: Diagnosis not present

## 2022-04-23 LAB — COMPREHENSIVE METABOLIC PANEL
ALT: 189 U/L — ABNORMAL HIGH (ref 0–44)
AST: 81 U/L — ABNORMAL HIGH (ref 15–41)
Albumin: 2.7 g/dL — ABNORMAL LOW (ref 3.5–5.0)
Alkaline Phosphatase: 143 U/L — ABNORMAL HIGH (ref 38–126)
Anion gap: 8 (ref 5–15)
BUN: 33 mg/dL — ABNORMAL HIGH (ref 8–23)
CO2: 24 mmol/L (ref 22–32)
Calcium: 9.5 mg/dL (ref 8.9–10.3)
Chloride: 105 mmol/L (ref 98–111)
Creatinine, Ser: 1.32 mg/dL — ABNORMAL HIGH (ref 0.44–1.00)
GFR, Estimated: 37 mL/min — ABNORMAL LOW (ref >60)
Glucose, Bld: 98 mg/dL (ref 70–99)
Potassium: 4.7 mmol/L (ref 3.5–5.1)
Sodium: 137 mmol/L (ref 135–145)
Total Bilirubin: 1.4 mg/dL — ABNORMAL HIGH (ref 0.3–1.2)
Total Protein: 5.5 g/dL — ABNORMAL LOW (ref 6.5–8.1)

## 2022-04-23 LAB — CBC
HCT: 33.4 % — ABNORMAL LOW (ref 36.0–46.0)
Hemoglobin: 10.8 g/dL — ABNORMAL LOW (ref 12.0–15.0)
MCH: 32 pg (ref 26.0–34.0)
MCHC: 32.3 g/dL (ref 30.0–36.0)
MCV: 98.8 fL (ref 80.0–100.0)
Platelets: 179 10*3/uL (ref 150–400)
RBC: 3.38 MIL/uL — ABNORMAL LOW (ref 3.87–5.11)
RDW: 13.3 % (ref 11.5–15.5)
WBC: 5.6 10*3/uL (ref 4.0–10.5)
nRBC: 0 % (ref 0.0–0.2)

## 2022-04-23 LAB — AMMONIA: Ammonia: 10 umol/L (ref 9–35)

## 2022-04-23 LAB — TROPONIN I (HIGH SENSITIVITY): Troponin I (High Sensitivity): 14 ng/L (ref <18)

## 2022-04-23 MED ORDER — ADULT MULTIVITAMIN W/MINERALS CH
1.0000 | ORAL_TABLET | Freq: Every day | ORAL | Status: DC
  Administered 2022-04-23 – 2022-04-25 (×2): 1 via ORAL
  Filled 2022-04-23 (×4): qty 1

## 2022-04-23 MED ORDER — SODIUM CHLORIDE 0.9 % IV SOLN
1.0000 g | Freq: Two times a day (BID) | INTRAVENOUS | Status: DC
  Administered 2022-04-23 – 2022-04-24 (×3): 1 g via INTRAVENOUS
  Filled 2022-04-23: qty 1
  Filled 2022-04-23 (×2): qty 10
  Filled 2022-04-23: qty 1

## 2022-04-23 MED ORDER — METOPROLOL TARTRATE 5 MG/5ML IV SOLN
5.0000 mg | Freq: Once | INTRAVENOUS | Status: AC
  Administered 2022-04-23: 5 mg via INTRAVENOUS
  Filled 2022-04-23: qty 5

## 2022-04-23 MED ORDER — SENNOSIDES-DOCUSATE SODIUM 8.6-50 MG PO TABS: 1.0000 | ORAL_TABLET | Freq: Every evening | ORAL | Status: DC | PRN

## 2022-04-23 MED ORDER — ORAL CARE MOUTH RINSE: 15.0000 mL | OROMUCOSAL | Status: DC | PRN

## 2022-04-23 MED ORDER — ENSURE ENLIVE PO LIQD
237.0000 mL | Freq: Three times a day (TID) | ORAL | Status: DC
  Administered 2022-04-25 (×3): 237 mL via ORAL

## 2022-04-23 MED ORDER — TRAZODONE HCL 50 MG PO TABS
50.0000 mg | ORAL_TABLET | Freq: Every evening | ORAL | Status: DC | PRN
  Administered 2022-04-23 – 2022-04-25 (×3): 50 mg via ORAL
  Filled 2022-04-23 (×3): qty 1

## 2022-04-23 MED ORDER — GUAIFENESIN 100 MG/5ML PO LIQD
5.0000 mL | ORAL | Status: DC | PRN
  Administered 2022-04-24: 5 mL via ORAL
  Filled 2022-04-23: qty 10

## 2022-04-23 MED ORDER — BOOST / RESOURCE BREEZE PO LIQD CUSTOM: 1.0000 | Freq: Three times a day (TID) | ORAL | Status: DC

## 2022-04-23 MED ORDER — IPRATROPIUM-ALBUTEROL 0.5-2.5 (3) MG/3ML IN SOLN: 3.0000 mL | RESPIRATORY_TRACT | Status: DC | PRN

## 2022-04-23 MED ORDER — HYDRALAZINE HCL 20 MG/ML IJ SOLN
10.0000 mg | INTRAMUSCULAR | Status: DC | PRN
  Administered 2022-04-24: 10 mg via INTRAVENOUS
  Filled 2022-04-23: qty 1

## 2022-04-23 MED ORDER — METOPROLOL TARTRATE 5 MG/5ML IV SOLN
5.0000 mg | INTRAVENOUS | Status: DC | PRN
  Administered 2022-04-23 (×2): 5 mg via INTRAVENOUS
  Filled 2022-04-23 (×2): qty 5

## 2022-04-23 NOTE — Progress Notes (Signed)
       CROSS COVER NOTE  NAME: Erica Donovan MRN: [id number] DOB : 06-10-1926 ATTENDING PHYSICIAN: Damita Lack, MD    Date of Service   04/23/2022   HPI/Events of Note   Notified of elevated HR 112-125 after PRN metoprolol. Review of chart shows HR as high as 138. BP 130/55.  Interventions   Assessment/Plan:  5 mg IV Metoprolol     This document was prepared using Dragon voice recognition software and may include unintentional dictation errors.  Neomia Glass DNP, MBA, FNP-BC Nurse Practitioner [facility] Pager [phone]

## 2022-04-23 NOTE — Evaluation (Signed)
Occupational Therapy Evaluation Patient Details Name: Erica Donovan MRN: [id number] DOB: [birth year] Today's Date: 04/23/2022   History of Present Illness Erica Donovan is a 88 year old female with history of P A-fib, DM 2, CKD stage II, HTN, recurrent UTI admitted for lethargy.  In the ED she was found to have acute kidney injury and urinary tract infection.   Clinical Impression   [name] presents to OT with generalized weakness and impaired cognition that impacts her engagement in self care tasks.  Prior to admission, patient was able to complete basic ADLs independently, with intermittent assist from her daughter for IADLs.  She lives alone, though her daughter lives across the street and provides daily assistance.  Currently, patient requires setup assist for UB ADLs while seated, and max assist for LB dressing, bathing, and toileting.  OT provided max assist for bed mobility and sit to stand transfer with RW.  Did not assess ambulation due to poor balance while standing.  [name] will continue to benefit from skilled OT services in acute setting to support functional strengthening, cognition, and safety and independence in ADLs.  Recommend short term rehab upon discharge to further support these goals, as patient's daughter is at risk for injury due to level of physical assist currently needed.      Recommendations for follow up therapy are one component of a multi-disciplinary discharge planning process, led by the attending physician.  Recommendations may be updated based on patient status, additional functional criteria and insurance authorization.   Follow Up Recommendations  Skilled nursing-short term rehab (<3 hours/day)    Assistance Recommended at Discharge Frequent or constant Supervision/Assistance  Patient can return home with the following A lot of help with walking and/or transfers;A lot of help with bathing/dressing/bathroom;Assistance with  cooking/housework;Direct supervision/assist for medications management;Direct supervision/assist for financial management;Assist for transportation;Help with stairs or ramp for entrance    Functional Status Assessment  Patient has had a recent decline in their functional status and demonstrates the ability to make significant improvements in function in a reasonable and predictable amount of time.  Equipment Recommendations  None recommended by OT    Recommendations for Other Services       Precautions / Restrictions Precautions Precautions: Fall Restrictions Weight Bearing Restrictions: No      Mobility Bed Mobility Overal bed mobility: Needs Assistance Bed Mobility: Rolling, Sidelying to Sit, Sit to Supine Rolling: Mod assist Sidelying to sit: Max assist   Sit to supine: Mod assist   General bed mobility comments: HOB elevated Patient Response: Cooperative  Transfers Overall transfer level: Needs assistance Equipment used: Rolling walker (2 wheels) Transfers: Sit to/from Stand Sit to Stand: Max assist                  Balance Overall balance assessment: Needs assistance Sitting-balance support: Single extremity supported, Feet supported Sitting balance-Leahy Scale: Good     Standing balance support: Bilateral upper extremity supported Standing balance-Leahy Scale: Poor                             ADL either performed or assessed with clinical judgement   ADL Overall ADL's : Needs assistance/impaired Eating/Feeding: Set up;Sitting   Grooming: Set up;Sitting   Upper Body Bathing: Set up;Sitting           Lower Body Dressing: Maximal assistance;Sitting/lateral leans               Functional mobility during  ADLs: Maximal assistance;Rolling walker (2 wheels) General ADL Comments: Able to perform UB ADLs while seated with setup assist, max assist for lower body dressing/bathing.  Requires max assist for bed mobility and sit to stand  transfer with RW.     Vision Baseline Vision/History: 1 Wears glasses Patient Visual Report: No change from baseline       Perception     Praxis      Pertinent Vitals/Pain Pain Assessment Pain Assessment: No/denies pain     Hand Dominance Right   Extremity/Trunk Assessment Upper Extremity Assessment Upper Extremity Assessment: Overall WFL for tasks assessed   Lower Extremity Assessment Lower Extremity Assessment: Generalized weakness       Communication Communication Communication: No difficulties   Cognition Arousal/Alertness: Awake/alert Behavior During Therapy: WFL for tasks assessed/performed Overall Cognitive Status: History of cognitive impairments - at baseline                                 General Comments: Requires multiple verbal cues to follow one-step commands.  Oriented to self and location, disoriented to situation.     General Comments       Exercises Other Exercises Other Exercises: provided education re: OT role and plan of care, fall and safety precautions, discharge recommendations, assist for self care and transfers   Shoulder Instructions      Home Living Family/patient expects to be discharged to:: Private residence Living Arrangements: Alone Available Help at Discharge: Available PRN/intermittently;Family (patient's daughter lives across the street) Type of Home: House Home Access: Stairs to enter Technical brewer of Steps: 3-4 Entrance Stairs-Rails: Right;Left;Can reach both Home Layout: One level     Bathroom Shower/Tub: Teacher, early years/pre: Standard     Home Equipment: Rollator (4 wheels);Tub bench;Grab bars - tub/shower;Grab bars - toilet          Prior Functioning/Environment Prior Level of Function : Needs assist  Cognitive Assist : ADLs (cognitive)   ADLs (Cognitive): Intermittent cues Physical Assist : ADLs (physical)   ADLs (physical): Bathing;IADLs Mobility Comments:  Patient is mod I with ambulation using 4WW ADLs Comments: Patient able to perform basic ADLs with grossly mod I, intermittently requesting assistance from daughter for bathing.  Patient's daughter provides assistance for cleaning, meals, medications, transportation though patient is able to engage with some portion of these tasks.        OT Problem List: Decreased strength;Decreased activity tolerance;Decreased range of motion;Impaired balance (sitting and/or standing);Decreased cognition;Decreased safety awareness;Decreased knowledge of use of DME or AE      OT Treatment/Interventions: Self-care/ADL training;Therapeutic exercise;DME and/or AE instruction;Therapeutic activities;Cognitive remediation/compensation;Patient/family education;Balance training    OT Goals(Current goals can be found in the care plan section) Acute Rehab OT Goals Patient Stated Goal: to return home OT Goal Formulation: With patient/family Time For Goal Achievement: 05/07/22 Potential to Achieve Goals: Good  OT Frequency: Min 2X/week    Co-evaluation              AM-PAC OT "6 Clicks" Daily Activity     Outcome Measure Help from another person eating meals?: None Help from another person taking care of personal grooming?: A Little Help from another person toileting, which includes using toliet, bedpan, or urinal?: A Lot Help from another person bathing (including washing, rinsing, drying)?: A Lot Help from another person to put on and taking off regular upper body clothing?: A Little Help from another person to put  on and taking off regular lower body clothing?: A Lot 6 Click Score: 16   End of Session Equipment Utilized During Treatment: Gait belt;Rolling walker (2 wheels)  Activity Tolerance: Patient tolerated treatment well Patient left: in bed;with call bell/phone within reach;with bed alarm set;with family/visitor present  OT Visit Diagnosis: Muscle weakness (generalized) (M62.81);Other symptoms and  signs involving cognitive function;Unsteadiness on feet (R26.81)                Time: 7373-6681 OT Time Calculation (min): 32 min Charges:  OT General Charges $OT Visit: 1 Visit OT Evaluation $OT Eval Moderate Complexity: 1 Mod OT Treatments $Therapeutic Activity: 8-22 mins  Jeneen Montgomery, OTR/L 04/23/22, 4:00 PM

## 2022-04-23 NOTE — Progress Notes (Signed)
PROGRESS NOTE    Erica Donovan  KZL:[id number] DOB: [birth year] DOA: 04/21/2022 PCP: Juluis Pitch, MD   Brief Narrative:  88 year old with history of P A-fib, DM 2, CKD stage II, HTN, recurrent UTI admitted for lethargy.  In the ED she was found to have acute kidney injury and urinary tract infection.   Assessment & Plan:  Principal Problem:   Severe sepsis ([place]) Active Problems:   Acute encephalopathy   Benign essential hypertension   Chronic kidney disease   Diabetes mellitus type 2, uncomplicated (HCC)   Paroxysmal atrial fibrillation (HCC)   Transaminitis   Hyperbilirubinemia   AKI (acute kidney injury) (HCC)   Elevated troponin   Known medical problems   Metabolic encephalopathy secondary to severe sepsis and complicated UTI Sepsis physiology appears to be slowly improving.  Leukocytosis appears to have resolved. CT head negative - Follow-up blood culture.  Urine culture not sent upon admission.  But outpatient urine cultures reviewed from 10/5 which grew pansensitive E. coli.  We will transition to IV Rocephin, discontinue vancomycin and cefepime.  Eventually this can be switched to Keflex   AKI Baseline creatinine 1.1.  Admission creatinine 2.12 which appears to be improving.  Continue IV fluids   Transaminitis Cholelithiasis without any evidence of cholecystitis In setting of sepsis.  Slowly improving   Atrial fibrillation, paroxysmal Current continue amiodarone.  IV as needed ordered.  Not on anticoagulation due to risk of fall and subdural hematoma per outpatient Cardiology note at [facility].   Postural dizziness/hypotension - On midodrine   Anxiety and depression; memory loss.  Continue Lexapro   Troponin with minimal elevation Likely demand ischemia   Patient is DNR PT/OT    DVT prophylaxis: Lovenox Code Status: DNR Family Communication: Met with daughter at bedside  Status is: Inpatient Remains inpatient appropriate because: Still quite weak,  continue IV fluids and antibiotics      Subjective: Seen and examined at bedside.  Overall patient appears slightly better according to the daughter who is present but overall still very weak.  Patient lives alone and typically ambulates with the help of walker at home and performs basic chores.  She has been dealing with this urine infection for the past almost 4 weeks and since then has been extremely weak.   Examination:  Constitutional: Not in acute distress.  Elderly frail Respiratory: Minimal bibasilar crackles Cardiovascular: Normal sinus rhythm, no rubs Abdomen: Nontender nondistended good bowel sounds Musculoskeletal: No edema noted Skin: No rashes seen Neurologic: CN 2-12 grossly intact.  And nonfocal Psychiatric: Poor judgment and insight.  Alert to name and place Objective: Vitals:   04/22/22 1304 04/22/22 1532 04/22/22 2127 04/23/22 0534  BP: 134/65 (!) 135/55 (!) 149/66 (!) 160/75  Pulse: 82 80 88 84  Resp: '18 18 18 ' (!) 22  Temp: 98.2 F (36.8 C) 98.6 F (37 C) 99.5 F (37.5 C) 98.9 F (37.2 C)  TempSrc: Oral Oral Oral   SpO2: 96% 100% 98% 99%  Weight:      Height:        Intake/Output Summary (Last 24 hours) at 04/23/2022 0845 Last data filed at 04/23/2022 0405 Gross per 24 hour  Intake 1922.89 ml  Output --  Net 1922.89 ml   Filed Weights   04/21/22 2017  Weight: 74.8 kg     Data Reviewed:   CBC: Recent Labs  Lab 04/21/22 2020 04/22/22 0529 04/23/22 0422  WBC 13.6* 8.8 5.6  NEUTROABS 11.8*  --   --  HGB 14.0 10.3* 10.8*  HCT 44.2 31.8* 33.4*  MCV 100.7* 99.1 98.8  PLT 227 181 937   Basic Metabolic Panel: Recent Labs  Lab 04/21/22 2020 04/22/22 0529 04/23/22 0422  NA 134* 136 137  K 4.5 4.5 4.7  CL 99 105 105  CO2 '22 24 24  ' GLUCOSE 159* 178* 98  BUN 55* 49* 33*  CREATININE 2.12* 1.73* 1.32*  CALCIUM 10.2 8.8* 9.5   GFR: Estimated Creatinine Clearance: 25.3 mL/min (A) (by C-G formula based on SCr of 1.32 mg/dL  (H)). Liver Function Tests: Recent Labs  Lab 04/21/22 2020 04/22/22 0529 04/23/22 0422  AST 270* 136* 81*  ALT 396* 232* 189*  ALKPHOS 161* 111 143*  BILITOT 1.9* 1.6* 1.4*  PROT 7.5 5.1* 5.5*  ALBUMIN 3.9 2.6* 2.7*   No results for input(s): "LIPASE", "AMYLASE" in the last 168 hours. No results for input(s): "AMMONIA" in the last 168 hours. Coagulation Profile: No results for input(s): "INR", "PROTIME" in the last 168 hours. Cardiac Enzymes: No results for input(s): "CKTOTAL", "CKMB", "CKMBINDEX", "TROPONINI" in the last 168 hours. BNP (last 3 results) No results for input(s): "PROBNP" in the last 8760 hours. HbA1C: No results for input(s): "HGBA1C" in the last 72 hours. CBG: No results for input(s): "GLUCAP" in the last 168 hours. Lipid Profile: No results for input(s): "CHOL", "HDL", "LDLCALC", "TRIG", "CHOLHDL", "LDLDIRECT" in the last 72 hours. Thyroid Function Tests: No results for input(s): "TSH", "T4TOTAL", "FREET4", "T3FREE", "THYROIDAB" in the last 72 hours. Anemia Panel: No results for input(s): "VITAMINB12", "FOLATE", "FERRITIN", "TIBC", "IRON", "RETICCTPCT" in the last 72 hours. Sepsis Labs: Recent Labs  Lab 04/21/22 2020 04/21/22 2157 04/22/22 0529  PROCALCITON  --   --  0.78  LATICACIDVEN 1.5 1.8  --     Recent Results (from the past 240 hour(s))  Blood culture (routine x 2)     Status: None (Preliminary result)   Collection Time: 04/21/22  9:58 PM   Specimen: BLOOD  Result Value Ref Range Status   Specimen Description BLOOD BLOOD RIGHT FOREARM  Final   Special Requests   Final    BOTTLES DRAWN AEROBIC AND ANAEROBIC Blood Culture adequate volume   Culture   Final    NO GROWTH 2 DAYS Performed at [facility], [address], [place], [place] [address]    Report Status PENDING  Incomplete  Blood culture (routine x 2)     Status: None (Preliminary result)   Collection Time: 04/22/22 12:46 AM   Specimen: BLOOD LEFT ARM  Result Value Ref  Range Status   Specimen Description BLOOD LEFT ARM  Final   Special Requests   Final    BOTTLES DRAWN AEROBIC AND ANAEROBIC Blood Culture adequate volume   Culture   Final    NO GROWTH 1 DAY Performed at [facility], [address], [place], [place] [address]    Report Status PENDING  Incomplete         Radiology Studies: CT ABDOMEN PELVIS WO CONTRAST  Result Date: 04/21/2022 CLINICAL DATA:  Altered mental status. Elevated liver function studies. EXAM: CT ABDOMEN AND PELVIS WITHOUT CONTRAST TECHNIQUE: Multidetector CT imaging of the abdomen and pelvis was performed following the standard protocol without IV contrast. RADIATION DOSE REDUCTION: This exam was performed according to the departmental dose-optimization program which includes automated exposure control, adjustment of the mA and/or kV according to patient size and/or use of iterative reconstruction technique. COMPARISON:  10/15/2019 FINDINGS: Lower chest: Atelectasis in the lung  bases.  Cardiac enlargement. Hepatobiliary: Cholelithiasis with multiple stones in the gallbladder. No gallbladder wall thickening or infiltration. No bile duct dilatation. No focal liver lesions. Pancreas: Pancreatic atrophy with scattered calcifications consistent with chronic pancreatitis. No acute changes. Spleen: Normal in size without focal abnormality. Adrenals/Urinary Tract: No adrenal gland nodules. Bilateral renal cysts. Largest is in the lower pole of the right kidney measuring 3.9 cm diameter. No follow-up imaging is indicated. No hydronephrosis or hydroureter. Bladder is normal. Stomach/Bowel: Stomach, small bowel, and colon are not abnormally distended. No wall thickening or inflammatory changes. Diverticulosis of the sigmoid colon without evidence of acute diverticulitis. Appendix is not identified. Vascular/Lymphatic: Aortic atherosclerosis. No enlarged abdominal or pelvic lymph nodes. Reproductive: Status post hysterectomy. No adnexal  masses. Other: No abdominal wall hernia or abnormality. No abdominopelvic ascites. Musculoskeletal: Degenerative changes.  No acute bony abnormalities. IMPRESSION: 1. Cholelithiasis without evidence of acute cholecystitis. 2. Changes of chronic pancreatitis.  No acute changes. 3. No evidence of bowel obstruction or inflammation. 4. Aortic atherosclerosis. Electronically Signed   By: Lucienne Capers M.D.   On: 04/21/2022 23:22   CT HEAD WO CONTRAST (5MM)  Result Date: 04/21/2022 CLINICAL DATA:  Head trauma. EXAM: CT HEAD WITHOUT CONTRAST TECHNIQUE: Contiguous axial images were obtained from the base of the skull through the vertex without intravenous contrast. RADIATION DOSE REDUCTION: This exam was performed according to the departmental dose-optimization program which includes automated exposure control, adjustment of the mA and/or kV according to patient size and/or use of iterative reconstruction technique. COMPARISON:  Head CT dated 04/10/2020. FINDINGS: Brain: Moderate age-related atrophy and chronic microvascular ischemic changes. There is no acute intracranial hemorrhage. No mass effect or midline shift. No extra-axial fluid collection. Vascular: No hyperdense vessel or unexpected calcification. Skull: No acute calvarial pathology. Sinuses/Orbits: Partial opacification of the right maxillary sinus which may represent a retention cyst or polyp. No air-fluid level. The mastoid air cells are clear. Other: None IMPRESSION: 1. No acute intracranial pathology. 2. Moderate age-related atrophy and chronic microvascular ischemic changes. Electronically Signed   By: Anner Crete M.D.   On: 04/21/2022 22:30   DG Chest 2 View  Result Date: 04/21/2022 CLINICAL DATA:  Urinary tract infection EXAM: CHEST - 2 VIEW COMPARISON:  04/10/2020 FINDINGS: Lungs are well expanded, symmetric, and clear. No pneumothorax or pleural effusion. Cardiac size within normal limits. Pulmonary vascularity is normal. Osseous  structures are age-appropriate. No acute bone abnormality. IMPRESSION: No active cardiopulmonary disease. Electronically Signed   By: Fidela Salisbury M.D.   On: 04/21/2022 21:29        Scheduled Meds:  amiodarone  100 mg Oral Daily   atorvastatin  10 mg Oral Daily   enoxaparin (LOVENOX) injection  30 mg Subcutaneous Q24H   escitalopram  5 mg Oral Daily   midodrine  5 mg Oral TID with meals   montelukast  10 mg Oral QHS   pantoprazole  40 mg Oral Daily   sodium chloride flush  3 mL Intravenous Q12H   vancomycin variable dose per unstable renal function (pharmacist dosing)   Does not apply See admin instructions   Continuous Infusions:  lactated ringers 125 mL/hr at 04/23/22 0527   [START ON 04/24/2022] vancomycin       LOS: 2 days   Time spent= 35 mins    Erica Glasheen Arsenio Loader, MD [facility]  If 7PM-7AM, please contact night-coverage  04/23/2022, 8:45 AM

## 2022-04-23 NOTE — Progress Notes (Signed)
Initial Nutrition Assessment  DOCUMENTATION CODES:   Not applicable  INTERVENTION:   -D/c Boost Breeze po TID, each supplement provides 250 kcal and 9 grams of protein  -Ensure Enlive po TID, each supplement provides 350 kcal and 20 grams of protein -Magic cup TID with meals, each supplement provides 290 kcal and 9 grams of protein  -MVI with minerals daily -Liberalize diet to regular for wider variety of meal selections  NUTRITION DIAGNOSIS:   Inadequate oral intake related to poor appetite as evidenced by per patient/family report.  GOAL:   Patient will meet greater than or equal to 90% of their needs  MONITOR:   PO intake, Supplement acceptance  REASON FOR ASSESSMENT:   Rounds    ASSESSMENT:   Pt with PAF, DM2, CKD and HTN and multiple episodes of UTI in the past month admitted with lethargy.  Pt admitted with metabolic encephalopathy secondary to severe sepsis and complicated UTI.   Reviewed I/O's: +1.9 L x 24 hours   Pt unavailable at time of visit. Pt receiving personal care at time of visit. RD unable to obtain further nutrition-related history or complete nutrition-focused physical exam at this time.    Pt seen per request of food and Marketing executive. She reports that pt with very poor oral intake and family states that she prefers sweets. She is mainly eating sweet things. Noted meal completions 0-50%.   Noted pt has been ordered Boost Breeze supplements but refusing them.   Reviewed wt hx; wt has been stable.   Pt with poor oral intake and would benefit from nutrient dense supplement. One Ensure Enlive supplement provides 350 kcals, 20 grams protein, and 44-45 grams of carbohydrate vs one Glucerna shake supplement, which provides 220 kcals, 10 grams of protein, and 26 grams of carbohydrate. Given pt's hx of DM, RD will reassess adequacy of PO intake, CBGS, and adjust supplement regimen as appropriate at follow-up.    Medications reviewed.   Lab  Results  Component Value Date   HGBA1C 6.1 (H) 04/11/2020   PTA DM medications are .   Labs reviewed: CBGS: 152 (inpatient orders for glycemic control are none).    Diet Order:   Diet Order             Diet heart healthy/carb modified Room service appropriate? Yes; Fluid consistency: Thin  Diet effective now                   EDUCATION NEEDS:   Education needs have been addressed  Skin:  Skin Assessment: Reviewed RN Assessment  Last BM:  04/21/22  Height:   Ht Readings from Last 1 Encounters:  04/22/22 '5\' 3"'$  (1.6 m)    Weight:   Wt Readings from Last 1 Encounters:  04/21/22 74.8 kg    Ideal Body Weight:  52.3 kg  BMI:  Body mass index is 29.23 kg/m.  Estimated Nutritional Needs:   Kcal:  1650-1850  Protein:  85-100 grams  Fluid:  > 1.6 L    Loistine Chance, RD, LDN, [place] Registered Dietitian II Certified Diabetes Care and Education Specialist Please refer to [facility] for RD and/or RD on-call/weekend/after hours pager

## 2022-04-23 NOTE — Progress Notes (Addendum)
Mobility Specialist - Progress Note   04/23/22 1700  Mobility  Activity Ambulated with assistance in room  Level of Assistance Standby assist, set-up cues, supervision of patient - no hands on  Assistive Device Front wheel walker  Distance Ambulated (ft) 5 ft  Activity Response Tolerated well  $Mobility charge 1 Mobility     Pt lying in bed upon arrival, utilizing 2L. Pt pleasant and agreeable to activity. Completed bed mobility with modA; sitting balance improves with time. Pt does report feeling 'woozy' with transfer. STS with maxA and extra time. VC for hand placement and anterior weight shifting for corrective posture. Follows single-step commands fairly well. Pt takes several lateral steps towards HOB with MinG-CGA before returning to seated position. Posterior lean as pt becomes increasingly fatigue. Further activity limited d/t elevated HR ranging high 110s - mid 140s with minimal activity. Pt returned supine with assist on LE. Alarm set, needs in reach, mitts donned. Family at bedside. RN notified.    Kathee Delton Mobility Specialist 04/23/22, 5:21 PM

## 2022-04-24 DIAGNOSIS — A419 Sepsis, unspecified organism: Secondary | ICD-10-CM | POA: Diagnosis not present

## 2022-04-24 DIAGNOSIS — R6520 Severe sepsis without septic shock: Secondary | ICD-10-CM | POA: Diagnosis not present

## 2022-04-24 LAB — BASIC METABOLIC PANEL
Anion gap: 10 (ref 5–15)
BUN: 26 mg/dL — ABNORMAL HIGH (ref 8–23)
CO2: 26 mmol/L (ref 22–32)
Calcium: 10 mg/dL (ref 8.9–10.3)
Chloride: 102 mmol/L (ref 98–111)
Creatinine, Ser: 1.1 mg/dL — ABNORMAL HIGH (ref 0.44–1.00)
GFR, Estimated: 47 mL/min — ABNORMAL LOW (ref >60)
Glucose, Bld: 127 mg/dL — ABNORMAL HIGH (ref 70–99)
Potassium: 4.7 mmol/L (ref 3.5–5.1)
Sodium: 138 mmol/L (ref 135–145)

## 2022-04-24 LAB — CBC
HCT: 36.9 % (ref 36.0–46.0)
Hemoglobin: 11.7 g/dL — ABNORMAL LOW (ref 12.0–15.0)
MCH: 31.5 pg (ref 26.0–34.0)
MCHC: 31.7 g/dL (ref 30.0–36.0)
MCV: 99.2 fL (ref 80.0–100.0)
Platelets: 192 10*3/uL (ref 150–400)
RBC: 3.72 MIL/uL — ABNORMAL LOW (ref 3.87–5.11)
RDW: 12.9 % (ref 11.5–15.5)
WBC: 6.5 10*3/uL (ref 4.0–10.5)
nRBC: 0 % (ref 0.0–0.2)

## 2022-04-24 LAB — HEPATIC FUNCTION PANEL
ALT: 136 U/L — ABNORMAL HIGH (ref 0–44)
AST: 41 U/L (ref 15–41)
Albumin: 2.9 g/dL — ABNORMAL LOW (ref 3.5–5.0)
Alkaline Phosphatase: 166 U/L — ABNORMAL HIGH (ref 38–126)
Bilirubin, Direct: 0.4 mg/dL — ABNORMAL HIGH (ref 0.0–0.2)
Indirect Bilirubin: 0.8 mg/dL (ref 0.3–0.9)
Total Bilirubin: 1.2 mg/dL (ref 0.3–1.2)
Total Protein: 6.2 g/dL — ABNORMAL LOW (ref 6.5–8.1)

## 2022-04-24 LAB — MAGNESIUM: Magnesium: 1.8 mg/dL (ref 1.7–2.4)

## 2022-04-24 MED ORDER — SODIUM CHLORIDE 0.9 % IV SOLN: INTRAVENOUS | Status: DC

## 2022-04-24 MED ORDER — SODIUM CHLORIDE 0.9 % IV SOLN
1.0000 g | Freq: Two times a day (BID) | INTRAVENOUS | Status: DC
  Administered 2022-04-24 – 2022-04-25 (×2): 1 g via INTRAVENOUS
  Filled 2022-04-24: qty 1
  Filled 2022-04-24: qty 10

## 2022-04-24 NOTE — Progress Notes (Signed)
Gave patient prn hydralazine due to sbp over 180.
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
## 2022-04-24 NOTE — Progress Notes (Signed)
In and out cath completed by day shift

## 2022-04-24 NOTE — Plan of Care (Signed)
  Problem: Clinical Measurements: Goal: Diagnostic test results will improve Outcome: Progressing   Problem: Nutrition: Goal: Adequate nutrition will be maintained Outcome: Not Progressing   Problem: Elimination: Goal: Will not experience complications related to bowel motility Outcome: Not Progressing Goal: Will not experience complications related to urinary retention Outcome: Not Progressing   Problem: Elimination: Goal: Will not experience complications related to urinary retention Outcome: Not Progressing   Problem: Safety: Goal: Ability to remain free from injury will improve Outcome: Progressing   Problem: Skin Integrity: Goal: Risk for impaired skin integrity will decrease Outcome: Progressing

## 2022-04-24 NOTE — Progress Notes (Signed)
PROGRESS NOTE    LIV RALLIS  RCV:[id number] DOB: 1926-05-04 DOA: 04/21/2022 PCP: Juluis Pitch, MD   Brief Narrative:  88 year old with history of P A-fib, DM 2, CKD stage II, HTN, recurrent UTI admitted for lethargy.  In the ED she was found to have acute kidney injury and urinary tract infection.   Assessment & Plan:  Principal Problem:   Severe sepsis ([place]) Active Problems:   Acute encephalopathy   Benign essential hypertension   Chronic kidney disease   Diabetes mellitus type 2, uncomplicated (HCC)   Paroxysmal atrial fibrillation (HCC)   Transaminitis   Hyperbilirubinemia   AKI (acute kidney injury) (HCC)   Elevated troponin   Known medical problems   Metabolic encephalopathy secondary to severe sepsis and complicated UTI Sepsis physiology appears to be fluctuating.  Leukocytosis appears to have resolved. CT head negative.  Mentation slowly improving - Follow-up blood culture.  Urine culture not sent upon admission.  But outpatient urine cultures reviewed from 10/5 which grew pansensitive E. coli.  Disc continue vancomycin and cefepime.  On IV Rocephin, eventually can transition to Keflex   AKI, resolved Baseline creatinine 1.1.  Admission creatinine 2.12 which appears to be improving.  Resolved with IV fluids   Transaminitis Cholelithiasis without any evidence of cholecystitis In setting of sepsis.  LFTs are improving   Atrial fibrillation, paroxysmal Current continue amiodarone.  IV as needed ordered.  Not on anticoagulation due to risk of fall and subdural hematoma per outpatient Cardiology note at [facility].   Postural dizziness/hypotension - On midodrine, discontinued due to elevated blood pressure this morning   Anxiety and depression; memory loss.  Continue Lexapro   Troponin with minimal elevation Likely demand ischemia   Patient is DNR PT/OT    DVT prophylaxis: Lovenox Code Status: DNR Family Communication: Met with daughter at bedside  Status  is: Inpatient Remains inpatient appropriate because: Ongoing treatment for UTI, weakness.  Suspect she will end up requiring SNF  Subjective: Patient seen and examined at bedside.  Appears to be resting. Per daughter she has been very drowsy after working with physical therapy.  Overall still very weak.  No other acute events overnight.   Examination: Constitutional: Resting comfortably.  Not in acute distress. Respiratory: Clear to auscultation bilaterally Cardiovascular: Normal sinus rhythm, no rubs.  Systolic murmur heard Abdomen: Nontender nondistended good bowel sounds Musculoskeletal: No edema noted Skin: No rashes seen Neurologic: Well to assess but grossly moving all the extremities Psychiatric: Multiple assess  Objective: Vitals:   04/23/22 2147 04/24/22 0013 04/24/22 0523 04/24/22 0814  BP: (!) 130/55 (!) 158/115 (!) 188/98 (!) 161/66  Pulse: (!) 122 (!) 110 87 (!) 105  Resp: '18 18 16 14  ' Temp: 98.7 F (37.1 C) 98.5 F (36.9 C) 98.5 F (36.9 C) 97.8 F (36.6 C)  TempSrc: Oral Oral Oral   SpO2: 99%  99% 91%  Weight:      Height:        Intake/Output Summary (Last 24 hours) at 04/24/2022 0857 Last data filed at 04/24/2022 0830 Gross per 24 hour  Intake 440 ml  Output 450 ml  Net -10 ml   Filed Weights   04/21/22 2017  Weight: 74.8 kg     Data Reviewed:   CBC: Recent Labs  Lab 04/21/22 2020 04/22/22 0529 04/23/22 0422 04/24/22 0514  WBC 13.6* 8.8 5.6 6.5  NEUTROABS 11.8*  --   --   --   HGB 14.0 10.3* 10.8* 11.7*  HCT 44.2 31.8*  33.4* 36.9  MCV 100.7* 99.1 98.8 99.2  PLT 227 181 179 712   Basic Metabolic Panel: Recent Labs  Lab 04/21/22 2020 04/22/22 0529 04/23/22 0422 04/24/22 0514  NA 134* 136 137 138  K 4.5 4.5 4.7 4.7  CL 99 105 105 102  CO2 '22 24 24 26  ' GLUCOSE 159* 178* 98 127*  BUN 55* 49* 33* 26*  CREATININE 2.12* 1.73* 1.32* 1.10*  CALCIUM 10.2 8.8* 9.5 10.0  MG  --   --   --  1.8   GFR: Estimated Creatinine Clearance:  30.3 mL/min (A) (by C-G formula based on SCr of 1.1 mg/dL (H)). Liver Function Tests: Recent Labs  Lab 04/21/22 2020 04/22/22 0529 04/23/22 0422  AST 270* 136* 81*  ALT 396* 232* 189*  ALKPHOS 161* 111 143*  BILITOT 1.9* 1.6* 1.4*  PROT 7.5 5.1* 5.5*  ALBUMIN 3.9 2.6* 2.7*   No results for input(s): "LIPASE", "AMYLASE" in the last 168 hours. Recent Labs  Lab 04/23/22 1011  AMMONIA 10   Coagulation Profile: No results for input(s): "INR", "PROTIME" in the last 168 hours. Cardiac Enzymes: No results for input(s): "CKTOTAL", "CKMB", "CKMBINDEX", "TROPONINI" in the last 168 hours. BNP (last 3 results) No results for input(s): "PROBNP" in the last 8760 hours. HbA1C: No results for input(s): "HGBA1C" in the last 72 hours. CBG: No results for input(s): "GLUCAP" in the last 168 hours. Lipid Profile: No results for input(s): "CHOL", "HDL", "LDLCALC", "TRIG", "CHOLHDL", "LDLDIRECT" in the last 72 hours. Thyroid Function Tests: No results for input(s): "TSH", "T4TOTAL", "FREET4", "T3FREE", "THYROIDAB" in the last 72 hours. Anemia Panel: No results for input(s): "VITAMINB12", "FOLATE", "FERRITIN", "TIBC", "IRON", "RETICCTPCT" in the last 72 hours. Sepsis Labs: Recent Labs  Lab 04/21/22 2020 04/21/22 2157 04/22/22 0529  PROCALCITON  --   --  0.78  LATICACIDVEN 1.5 1.8  --     Recent Results (from the past 240 hour(s))  Blood culture (routine x 2)     Status: None (Preliminary result)   Collection Time: 04/21/22  9:58 PM   Specimen: BLOOD  Result Value Ref Range Status   Specimen Description BLOOD BLOOD RIGHT FOREARM  Final   Special Requests   Final    BOTTLES DRAWN AEROBIC AND ANAEROBIC Blood Culture adequate volume   Culture   Final    NO GROWTH 2 DAYS Performed at [facility], [address] [place], [place] [address]    Report Status PENDING  Incomplete  Blood culture (routine x 2)     Status: None (Preliminary result)   Collection Time: 04/22/22 12:46  AM   Specimen: BLOOD LEFT ARM  Result Value Ref Range Status   Specimen Description BLOOD LEFT ARM  Final   Special Requests   Final    BOTTLES DRAWN AEROBIC AND ANAEROBIC Blood Culture adequate volume   Culture   Final    NO GROWTH 1 DAY Performed at [facility], [address] [place], [place] [address]    Report Status PENDING  Incomplete         Radiology Studies: No results found.      Scheduled Meds:  amiodarone  100 mg Oral Daily   atorvastatin  10 mg Oral Daily   enoxaparin (LOVENOX) injection  30 mg Subcutaneous Q24H   escitalopram  5 mg Oral Daily   feeding supplement  237 mL Oral TID BM   montelukast  10 mg Oral QHS   multivitamin with minerals  1 tablet Oral  Daily   pantoprazole  40 mg Oral Daily   sodium chloride flush  3 mL Intravenous Q12H   Continuous Infusions:  cefTRIAXone (ROCEPHIN)  IV 200 mL/hr at 04/24/22 0830     LOS: 3 days   Time spent= 35 mins    Wasim Hurlbut Arsenio Loader, MD [facility]  If 7PM-7AM, please contact night-coverage  04/24/2022, 8:57 AM

## 2022-04-24 NOTE — Evaluation (Signed)
Physical Therapy Evaluation Patient Details Name: Erica Donovan MRN: [id number] DOB: 1926-05-24 Today's Date: 04/24/2022  History of Present Illness  Erica Donovan is a 88 year old female with history of P A-fib, DM 2, CKD stage II, HTN, recurrent UTI admitted for lethargy.  In the ED she was found to have acute kidney injury and urinary tract infection.   Clinical Impression  Pt admitted with above diagnosis. Pt received upright in bed with daughter present. Pt very lethargic, limited ability to keep eyes open. Reliant on daughter to assist in subjective. At baseline pt ambulates with 4WW.  Pt lives alone with gets frequent assist from daughter who lives across the street with ADL's/IADL's due to baseline cognitive impairment (Dementia).   To date, pt reliant on max to total assist to transfer from supine to sitting EOB. Pt becomes slightly more alert requiring minA and hand/foot placement for pt to prevent post lean and maintain static sitting balance with SBA. Intermittent need for minA to correct due to post lean. Able to open eyes periodically compared to supine but very limited. Follows single step commands with increased time and min multimodal cuing with ability to STS minA+2 to RW. Tolerating standing ~20 sec. Unable to take any sort of side steps, does not open eyes in standing but good glut activation noted before needing seated rest. Second STS performed maxA+1 with limited glut/knee extension compared to prior attempt. Pt returning supine in bed maxA+1 and maxA +2 with bed in trendelenburg to return towards HOB. Pt was resting on 2L/min [place] beginning of session and titrated to RA once seated. Pt maintained O2 sats at 95-96% with rest and activity. RN notified pt left on RA at end of session. Educated daughter on d/c recs, expectations with PT in coming days to update recs for safe return home. Pt's daughter understanding. All needs in reach. Pt currently with functional limitations due  to the deficits listed below (see PT Problem List). Pt will benefit from skilled PT to increase their independence and safety with mobility to allow discharge to the venue listed below.     Recommendations for follow up therapy are one component of a multi-disciplinary discharge planning process, led by the attending physician.  Recommendations may be updated based on patient status, additional functional criteria and insurance authorization.  Follow Up Recommendations Skilled nursing-short term rehab (<3 hours/day) Can patient physically be transported by private vehicle: No    Assistance Recommended at Discharge Frequent or constant Supervision/Assistance  Patient can return home with the following  Two people to help with walking and/or transfers;Two people to help with bathing/dressing/bathroom;Help with stairs or ramp for entrance;Assist for transportation;Assistance with cooking/housework    Equipment Recommendations Other (comment) (tbd by next venue of care)  Recommendations for Other Services       Functional Status Assessment Patient has had a recent decline in their functional status and demonstrates the ability to make significant improvements in function in a reasonable and predictable amount of time.     Precautions / Restrictions Precautions Precautions: Fall Restrictions Weight Bearing Restrictions: No      Mobility  Bed Mobility Overal bed mobility: Needs Assistance Bed Mobility: Supine to Sit, Sit to Supine   Sidelying to sit: Max assist, HOB elevated Supine to sit: Max assist     General bed mobility comments: maxA+2 bed in trendelenburg to scoot up towards [facility]    Transfers Overall transfer level: Needs assistance Equipment used: Rolling walker (2 wheels) Transfers: Sit to/from Stand  Sit to Stand: Min assist, From elevated surface, +2 physical assistance, Max assist           General transfer comment: Requires positioning of limbs to assist in  standing. After first STS, maxA for second STS with limited glute extension.    Ambulation/Gait               General Gait Details: deferred  Stairs            Wheelchair Mobility    Modified Rankin (Stroke Patients Only)       Balance Overall balance assessment: Needs assistance Sitting-balance support: Single extremity supported, Feet supported Sitting balance-Leahy Scale: Fair Sitting balance - Comments: Intermittent post lean requiring minA to correct.   Standing balance support: Bilateral upper extremity supported, During functional activity, Reliant on assistive device for balance Standing balance-Leahy Scale: Poor Standing balance comment: limited standing tolerance at RW                             Pertinent Vitals/Pain      Home Living Family/patient expects to be discharged to:: Private residence Living Arrangements: Alone Available Help at Discharge: Available PRN/intermittently;Family Type of Home: House Home Access: Stairs to enter Entrance Stairs-Rails: Right;Left;Can reach both Entrance Stairs-Number of Steps: 3-4   Home Layout: One level Home Equipment: Rollator (4 wheels);Tub bench;Grab bars - tub/shower;Grab bars - toilet;Hospital bed      Prior Function Prior Level of Function : Needs assist  Cognitive Assist : ADLs (cognitive)     Physical Assist : Mobility (physical)     Mobility Comments: Patient is mod I with ambulation using 4WW ADLs Comments: Pt's daughter assists with bathing ADL's PRN. Daughter assists with meals, cleaning, transportation, meds.     Hand Dominance        Extremity/Trunk Assessment   Upper Extremity Assessment Upper Extremity Assessment: Overall WFL for tasks assessed    Lower Extremity Assessment Lower Extremity Assessment: Generalized weakness       Communication      Cognition Arousal/Alertness: Awake/alert Behavior During Therapy: WFL for tasks assessed/performed Overall  Cognitive Status: History of cognitive impairments - at baseline                                 General Comments: Difficulty keeping eyes open. Overall non-verbal but follows single step commands with increased time and multimodal cues. Intermittently shakes head yes and no with simple questions ~25% of the time.        General Comments General comments (skin integrity, edema, etc.): SPo2 at 96% on RA at rest and with activity    Exercises Other Exercises Other Exercises: Role of PT in acute setting, d/c recs, use of RW.   Assessment/Plan    PT Assessment Patient needs continued PT services  PT Problem List Decreased strength;Decreased mobility;Decreased safety awareness;Decreased cognition;Decreased activity tolerance;Decreased balance;Decreased knowledge of use of DME       PT Treatment Interventions DME instruction;Therapeutic exercise;Gait training;Balance training;Stair training;Neuromuscular re-education;Functional mobility training;Therapeutic activities;Patient/family education    PT Goals (Current goals can be found in the Care Plan section)  Acute Rehab PT Goals Patient Stated Goal: Per daughter pt would like pt to return to baseline and go home if able PT Goal Formulation: Patient unable to participate in goal setting Time For Goal Achievement: 05/08/22 Potential to Achieve Goals: Poor    Frequency Min  2X/week     Co-evaluation               AM-PAC PT "6 Clicks" Mobility  Outcome Measure Help needed turning from your back to your side while in a flat bed without using bedrails?: Total Help needed moving from lying on your back to sitting on the side of a flat bed without using bedrails?: Total Help needed moving to and from a bed to a chair (including a wheelchair)?: Total Help needed standing up from a chair using your arms (e.g., wheelchair or bedside chair)?: A Lot Help needed to walk in hospital room?: Total Help needed climbing 3-5 steps  with a railing? : Total 6 Click Score: 7    End of Session Equipment Utilized During Treatment: Gait belt Activity Tolerance: Patient limited by lethargy Patient left: in bed;with bed alarm set;with family/visitor present Nurse Communication: Mobility status PT Visit Diagnosis: Other abnormalities of gait and mobility (R26.89);Muscle weakness (generalized) (M62.81);Difficulty in walking, not elsewhere classified (R26.2)    Time: 1017-5102 PT Time Calculation (min) (ACUTE ONLY): 26 min   Charges:   PT Evaluation $PT Eval Moderate Complexity: 1 Mod PT Treatments $Therapeutic Activity: 8-22 mins       Keylen Eckenrode M. Fairly IV, PT, DPT Physical Therapist- [facility]  04/24/2022, 11:04 AM

## 2022-04-24 NOTE — Care Management Important Message (Signed)
Important Message  Patient Details  Name: Erica Donovan MRN: [id number] Date of Birth: 1925-10-25   Medicare Important Message Given:  Yes     Dannette Barbara 04/24/2022, 11:41 AM

## 2022-04-24 NOTE — Progress Notes (Signed)
OT Cancellation Note  Patient Details Name: Erica Donovan MRN: [id number] DOB: March 08, 1926   Cancelled Treatment:    Reason Eval/Treat Not Completed: Fatigue/lethargy limiting ability to participate;Patient's level of consciousness. Upon attempt, pt sleeping, daughter present. Dtr reports that pt has been difficult to rouse with sternal rubs and noxious stimuli. Dtr agreeable to OT holding today and re-attempting next date in the afternoon when the dtr will be back.    Ardeth Perfect., MPH, MS, OTR/L ascom [phone] 04/24/22, 4:26 PM

## 2022-04-25 DIAGNOSIS — A419 Sepsis, unspecified organism: Secondary | ICD-10-CM | POA: Diagnosis not present

## 2022-04-25 DIAGNOSIS — R6520 Severe sepsis without septic shock: Secondary | ICD-10-CM | POA: Diagnosis not present

## 2022-04-25 LAB — CBC
HCT: 40.4 % (ref 36.0–46.0)
Hemoglobin: 13.6 g/dL (ref 12.0–15.0)
MCH: 32 pg (ref 26.0–34.0)
MCHC: 33.7 g/dL (ref 30.0–36.0)
MCV: 95.1 fL (ref 80.0–100.0)
Platelets: 198 10*3/uL (ref 150–400)
RBC: 4.25 MIL/uL (ref 3.87–5.11)
RDW: 13 % (ref 11.5–15.5)
WBC: 7.7 10*3/uL (ref 4.0–10.5)
nRBC: 0 % (ref 0.0–0.2)

## 2022-04-25 LAB — BASIC METABOLIC PANEL
Anion gap: 11 (ref 5–15)
BUN: 23 mg/dL (ref 8–23)
CO2: 23 mmol/L (ref 22–32)
Calcium: 9.8 mg/dL (ref 8.9–10.3)
Chloride: 106 mmol/L (ref 98–111)
Creatinine, Ser: 1.05 mg/dL — ABNORMAL HIGH (ref 0.44–1.00)
GFR, Estimated: 49 mL/min — ABNORMAL LOW (ref >60)
Glucose, Bld: 68 mg/dL — ABNORMAL LOW (ref 70–99)
Potassium: 3.7 mmol/L (ref 3.5–5.1)
Sodium: 140 mmol/L (ref 135–145)

## 2022-04-25 LAB — MAGNESIUM: Magnesium: 1.9 mg/dL (ref 1.7–2.4)

## 2022-04-25 MED ORDER — ENOXAPARIN SODIUM 40 MG/0.4ML IJ SOSY
40.0000 mg | PREFILLED_SYRINGE | INTRAMUSCULAR | Status: DC
  Filled 2022-04-25: qty 0.4

## 2022-04-25 NOTE — Progress Notes (Signed)
  PROGRESS NOTE    Erica Donovan  RDE:[id number] DOB: [birth year] DOA: 04/21/2022 PCP: Juluis Pitch, MD  214A/214A-AA  LOS: 4 days   Brief hospital course:   Assessment & Plan: Erica Donovan is a 88 year old with history of P A-fib, DM 2, CKD stage II, HTN, dementia, recurrent UTI admitted for lethargy.  In the ED she was found to have acute kidney injury and urinary tract infection.  Metabolic encephalopathy secondary to severe sepsis and complicated UTI CT head negative.  Mentation slowly improving - Urine culture not sent upon admission.  But outpatient urine cultures reviewed from 10/5 which grew pansensitive E. coli.   --received 3 days of ceftriaxone which completed the tx course.   Baseline dementia --Pt had baseline dementia, though mild and was mostly independent with family help PTA.  Has been having gradual decline and reduced oral intake. --palliative consult, per daughter request  AKI, resolved Baseline creatinine 1.1.  Admission creatinine 2.12.  Resolved with IV fluids   Transaminitis Cholelithiasis without any evidence of cholecystitis In setting of sepsis.  LFTs improved.   Atrial fibrillation, paroxysmal Not on anticoagulation due to risk of fall and subdural hematoma per outpatient Cardiology note at [facility].  --cont amiodarone   Hx of Postural dizziness/hypotension - On midodrine PTA, discontinued due to elevated blood pressure.   Anxiety and depression Continue Lexapro   Troponin with minimal elevation Likely demand ischemia    DVT prophylaxis: Lovenox SQ Code Status: DNR  Family Communication: daughter updated at bedside today Level of care: Med-Surg Dispo:   The patient is from: home Anticipated d/c is to: SNF rehab Anticipated d/c date is: whenever bed available   Subjective and Interval History:  Per daughter, pt is more alert today.  Pt still had no appetite, didn't want to eat.     Objective: Vitals:   04/24/22 1548 04/24/22  2018 04/25/22 0511 04/25/22 0815  BP: 137/64 (!) 168/79 (!) 176/80 (!) 178/93  Pulse: 83 98 97 93  Resp: '12 18 18 18  '$ Temp: 98.1 F (36.7 C) 98.5 F (36.9 C) 97.8 F (36.6 C) 98.5 F (36.9 C)  TempSrc:  Oral Oral Oral  SpO2: 96% 97% 97% 97%  Weight:      Height:        Intake/Output Summary (Last 24 hours) at 04/25/2022 1615 Last data filed at 04/25/2022 1030 Gross per 24 hour  Intake 409.11 ml  Output 1850 ml  Net -1440.89 ml   Filed Weights   04/21/22 2017  Weight: 74.8 kg    Examination:   Constitutional: NAD, alert, oriented to person and place HEENT: conjunctivae and lids normal, EOMI CV: No cyanosis.   RESP: normal respiratory effort, on RA Extremities: No effusions, edema in BLE SKIN: warm, dry Neuro: II - XII grossly intact.   Psych: Normal mood and affect.     Data Reviewed: I have personally reviewed labs and imaging studies  Time spent: 35 minutes  Enzo Bi, MD [facility] If 7PM-7AM, please contact night-coverage 04/25/2022, 4:15 PM

## 2022-04-25 NOTE — Progress Notes (Signed)
Physical Therapy Treatment Patient Details Name: Erica Donovan MRN: [id number] DOB: January 24, 1926 Today's Date: 04/25/2022   History of Present Illness [name] is a 88 year old female with history of P A-fib, DM 2, CKD stage II, HTN, recurrent UTI admitted for lethargy.  In the ED she was found to have acute kidney injury and urinary tract infection.    PT Comments    Pt received upright in bed sleeping. Awakens to voice. Agreeable to PT with encouragement. Pt still displaying significant fatigue but more interactive this date answering simple questions with 1-2 word answers and keeping eyes open more frequently. Pt still relying on maxA for all bed mobility, performing x2 STS at minA+2 with bed elevated. Limited gluteal clearance, poor tolerance only maintaining maybe for 10 sec each bout. Frequent minA at shoulders this date to prevent sitting posterior lean.  Pt visibly wanting to return to bed with attempts to return to supine after second bout. MaxA to return. Pt endorsing back pain. Adjusted bed with features to assist in alleviating back pain. All needs in reach with fall precautions in place. Pt still remains very limited and deconditioned needing 2 person assist with functional mobility. Will benefit from STR as pt is well below baseline lvel of functioning.    Recommendations for follow up therapy are one component of a multi-disciplinary discharge planning process, led by the attending physician.  Recommendations may be updated based on patient status, additional functional criteria and insurance authorization.  Follow Up Recommendations  Skilled nursing-short term rehab (<3 hours/day) Can patient physically be transported by private vehicle: No   Assistance Recommended at Discharge Frequent or constant Supervision/Assistance  Patient can return home with the following Two people to help with walking and/or transfers;Two people to help with bathing/dressing/bathroom;Help  with stairs or ramp for entrance;Assist for transportation;Assistance with cooking/housework   Equipment Recommendations  Other (comment) (next venue of care)    Recommendations for Other Services       Precautions / Restrictions Precautions Precautions: Fall Restrictions Weight Bearing Restrictions: No     Mobility  Bed Mobility Overal bed mobility: Needs Assistance Bed Mobility: Supine to Sit, Sit to Supine   Sidelying to sit: Max assist, HOB elevated Supine to sit: Max assist     General bed mobility comments: maxA+2 bed in trendelenburg to scoot up towards [facility] Patient Response: Cooperative  Transfers Overall transfer level: Needs assistance Equipment used: Rolling walker (2 wheels) Transfers: Sit to/from Stand Sit to Stand: Min assist, +2 physical assistance           General transfer comment: x2 trials    Ambulation/Gait               General Gait Details: deferred   Stairs             Wheelchair Mobility    Modified Rankin (Stroke Patients Only)       Balance Overall balance assessment: Needs assistance Sitting-balance support: Single extremity supported, Feet supported Sitting balance-Leahy Scale: Poor Sitting balance - Comments: Required frequent minA at shoulders to prevent post lean.   Standing balance support: Bilateral upper extremity supported, During functional activity, Reliant on assistive device for balance Standing balance-Leahy Scale: Poor Standing balance comment: limited standing tolerance at [employer] Arousal/Alertness: Awake/alert Behavior During Therapy: [place] for tasks assessed/performed Overall Cognitive Status: History  of cognitive impairments - at baseline                                 General Comments: ABle to interact some, answers 1 answer questions, keeping eyes open more frequently. Still appearing very fatigued.        Exercises General  Exercises - Lower Extremity Ankle Circles/Pumps: AAROM, Both, 10 reps, Supine Hip ABduction/ADduction: AAROM, Both, 10 reps, Supine Other Exercises Other Exercises: Benefits of OOB mobility, participating in PT to improve strength/mobility/independence    General Comments        Pertinent Vitals/Pain Pain Assessment Pain Assessment: Faces Faces Pain Scale: Hurts little more Pain Location: "back hurts" Pain Descriptors / Indicators: Grimacing, Moaning Pain Intervention(s): Limited activity within patient's tolerance, Monitored during session, Repositioned    Home Living                          Prior Function            PT Goals (current goals can now be found in the care plan section) Acute Rehab PT Goals Patient Stated Goal: Per daughter pt would like pt to return to baseline and go home if able PT Goal Formulation: Patient unable to participate in goal setting Time For Goal Achievement: 05/08/22 Potential to Achieve Goals: Poor Progress towards PT goals: Progressing toward goals    Frequency    Min 2X/week      PT Plan Current plan remains appropriate    Co-evaluation              AM-PAC PT "6 Clicks" Mobility   Outcome Measure  Help needed turning from your back to your side while in a flat bed without using bedrails?: Total Help needed moving from lying on your back to sitting on the side of a flat bed without using bedrails?: Total Help needed moving to and from a bed to a chair (including a wheelchair)?: Total Help needed standing up from a chair using your arms (e.g., wheelchair or bedside chair)?: A Lot Help needed to walk in hospital room?: Total Help needed climbing 3-5 steps with a railing? : Total 6 Click Score: 7    End of Session Equipment Utilized During Treatment: Gait belt Activity Tolerance: Patient limited by lethargy Patient left: in bed;with bed alarm set;with call bell/phone within reach Nurse Communication: Mobility  status PT Visit Diagnosis: Other abnormalities of gait and mobility (R26.89);Muscle weakness (generalized) (M62.81);Difficulty in walking, not elsewhere classified (R26.2)     Time: 1130-1146 PT Time Calculation (min) (ACUTE ONLY): 16 min  Charges:  $Therapeutic Exercise: 8-22 mins                     Donya Hitch M. Fairly IV, PT, DPT Physical Therapist- [facility]  04/25/2022, 12:09 PM

## 2022-04-26 ENCOUNTER — Inpatient Hospital Stay: Payer: Medicare Other

## 2022-04-26 DIAGNOSIS — E119 Type 2 diabetes mellitus without complications: Secondary | ICD-10-CM | POA: Diagnosis not present

## 2022-04-26 DIAGNOSIS — R4182 Altered mental status, unspecified: Secondary | ICD-10-CM

## 2022-04-26 DIAGNOSIS — Z66 Do not resuscitate: Secondary | ICD-10-CM

## 2022-04-26 DIAGNOSIS — R6520 Severe sepsis without septic shock: Secondary | ICD-10-CM | POA: Diagnosis not present

## 2022-04-26 DIAGNOSIS — N189 Chronic kidney disease, unspecified: Secondary | ICD-10-CM | POA: Diagnosis not present

## 2022-04-26 DIAGNOSIS — Z515 Encounter for palliative care: Secondary | ICD-10-CM

## 2022-04-26 DIAGNOSIS — A419 Sepsis, unspecified organism: Secondary | ICD-10-CM | POA: Diagnosis not present

## 2022-04-26 LAB — BASIC METABOLIC PANEL
Anion gap: 15 (ref 5–15)
BUN: 18 mg/dL (ref 8–23)
CO2: 23 mmol/L (ref 22–32)
Calcium: 9.7 mg/dL (ref 8.9–10.3)
Chloride: 102 mmol/L (ref 98–111)
Creatinine, Ser: 0.98 mg/dL (ref 0.44–1.00)
GFR, Estimated: 53 mL/min — ABNORMAL LOW (ref >60)
Glucose, Bld: 112 mg/dL — ABNORMAL HIGH (ref 70–99)
Potassium: 3.7 mmol/L (ref 3.5–5.1)
Sodium: 140 mmol/L (ref 135–145)

## 2022-04-26 LAB — MAGNESIUM: Magnesium: 1.7 mg/dL (ref 1.7–2.4)

## 2022-04-26 LAB — CULTURE, BLOOD (ROUTINE X 2)
Culture: NO GROWTH
Special Requests: ADEQUATE

## 2022-04-26 MED ORDER — HALOPERIDOL LACTATE 5 MG/ML IJ SOLN: 0.5000 mg | INTRAMUSCULAR | Status: DC | PRN

## 2022-04-26 MED ORDER — POLYVINYL ALCOHOL 1.4 % OP SOLN: 1.0000 [drp] | Freq: Four times a day (QID) | OPHTHALMIC | Status: DC | PRN

## 2022-04-26 MED ORDER — GLYCOPYRROLATE 1 MG PO TABS: 1.0000 mg | ORAL_TABLET | ORAL | Status: DC | PRN

## 2022-04-26 MED ORDER — ACETAMINOPHEN 650 MG RE SUPP
650.0000 mg | Freq: Three times a day (TID) | RECTAL | Status: DC | PRN
  Administered 2022-04-26: 650 mg via RECTAL
  Filled 2022-04-26: qty 1

## 2022-04-26 MED ORDER — GLYCOPYRROLATE 0.2 MG/ML IJ SOLN: 0.2000 mg | INTRAMUSCULAR | Status: DC | PRN

## 2022-04-26 MED ORDER — HALOPERIDOL 0.5 MG PO TABS: 0.5000 mg | ORAL_TABLET | ORAL | Status: DC | PRN

## 2022-04-26 MED ORDER — HALOPERIDOL LACTATE 2 MG/ML PO CONC: 0.5000 mg | ORAL | Status: DC | PRN

## 2022-04-26 MED ORDER — BIOTENE DRY MOUTH MT LIQD: 15.0000 mL | OROMUCOSAL | Status: DC | PRN

## 2022-04-26 MED ORDER — POLYETHYLENE GLYCOL 3350 17 G PO PACK
34.0000 g | PACK | Freq: Two times a day (BID) | ORAL | Status: DC
  Filled 2022-04-26: qty 2

## 2022-04-26 NOTE — Progress Notes (Signed)
  PROGRESS NOTE    Erica Donovan  UDJ:[id number] DOB: [birth year] DOA: 04/21/2022 PCP: Juluis Pitch, MD  214A/214A-AA  LOS: 5 days   Brief hospital course:   Assessment & Plan: Erica Donovan is a 88 year old with history of P A-fib, DM 2, CKD stage II, HTN, dementia, recurrent UTI admitted for lethargy.  In the ED she was found to have acute kidney injury and urinary tract infection.  Comfort measures --palliative care consulted, and daughter decided on comfort care and hospice   Metabolic encephalopathy secondary to severe sepsis and complicated UTI CT head negative.  Mentation slowly improving - Urine culture not sent upon admission.  But outpatient urine cultures reviewed from 10/5 which grew pansensitive E. coli.   --received 3 days of ceftriaxone which completed the tx course.   Baseline dementia --Pt had baseline dementia, though mild and was mostly independent with family help PTA.  Has been having gradual decline and reduced oral intake. --palliative consulted, per daughter request  AKI, resolved Baseline creatinine 1.1.  Admission creatinine 2.12.  Resolved with IV fluids   Transaminitis Cholelithiasis without any evidence of cholecystitis In setting of sepsis.  LFTs improved.   Atrial fibrillation, paroxysmal Not on anticoagulation due to risk of fall and subdural hematoma per outpatient Cardiology note at [facility].  --amiodarone d/c'ed due to comfort care status   Hx of Postural dizziness/hypotension - On midodrine PTA, discontinued due to elevated blood pressure.   Anxiety and depression -- Lexapro d/c'ed due to comfort care status   Troponin with minimal elevation Likely demand ischemia    DVT prophylaxis: Lovenox SQ Code Status: DNR  Family Communication: daughter updated at bedside today Level of care: Med-Surg Dispo:   The patient is from: home Anticipated d/c is to: hospice facility Anticipated d/c date is: whenever bed  available   Subjective and Interval History:  Pt declined from yesterday, now more lethargic and refused any oral intake.  Pt also complained of abdominal pain.  KUB unremarkable, with no stool impaction.     Palliative consulted, and daughter decided for hospice care.   Objective: Vitals:   04/25/22 1946 04/26/22 0503 04/26/22 0757 04/26/22 0800  BP: (!) 164/90 (!) 173/84 116/61   Pulse: (!) 107 (!) 108 93   Resp: 18  16   Temp: 97.6 F (36.4 C) 99.5 F (37.5 C) (!) 97.5 F (36.4 C)   TempSrc: Oral Oral Oral   SpO2: 96% 96% 97%   Weight:    72.4 kg  Height:        Intake/Output Summary (Last 24 hours) at 04/26/2022 1606 Last data filed at 04/26/2022 1003 Gross per 24 hour  Intake 0 ml  Output 1250 ml  Net -1250 ml   Filed Weights   04/21/22 2017 04/26/22 0800  Weight: 74.8 kg 72.4 kg    Examination:   Constitutional: NAD, eyes closed CV: No cyanosis.   RESP: normal respiratory effort, on RA SKIN: warm, dry   Data Reviewed: I have personally reviewed labs and imaging studies  Time spent: 35 minutes  Enzo Bi, MD [facility] If 7PM-7AM, please contact night-coverage 04/26/2022, 4:06 PM

## 2022-04-26 NOTE — Consult Note (Addendum)
Consultation Note Date: 04/26/2022   Patient Name: Erica Donovan  DOB: 12-19-1925  MRN: [id number]  Age / Sex: 88 y.o., female  PCP: Juluis Pitch, MD Referring Physician: Enzo Bi, MD  Reason for Consultation: Establishing goals of care  HPI/Patient Profile: 88 y.o. female  with past medical history of dementia, paroxysmal A-fib, type 2 diabetes, CKD (stage II), HTN, recurrent UTIs, COPD, and right wrist ORIF (May 2021) admitted on 04/21/2022 with poor p.o. intake and lethargy.  Patient is being treated for UTI.  In light of patient's advanced age and comorbidities PMT was consulted to assist with clarification and goals of care..   Clinical Assessment and Goals of Care: I have reviewed medical records including EPIC notes, labs and imaging, assessed the patient and then met with patient and her daughter [name]/HCPOA to discuss diagnosis, prognosis, GOC, EOL wishes, disposition and options.  I introduced Palliative Medicine as specialized medical care for people living with serious illness. It focuses on providing relief from the symptoms and stress of a serious illness. The goal is to improve quality of life for both the patient and the family.  We discussed a brief life review of the patient.  Patient is a widow (14 years) and has 7 children (2 deceased).  She currently resides with her daughter [name], her primary caregiver.  As far as functional and nutritional status PTA [name] endorses patient was declining over the last month - just after a family trip to the beach.  She shares patient was not as mobile/scared to transfer for fear of falling, was not able to feed herself with a spoon as easily as before, could not identify as many family members, began not eating dinner, was wetting/soiling herself when previously she would go to the bathroom/say when she needed to use the toilet, and even had  decreased intake and foods that previously brought her pleasure (donut sticks) for breakfast and lunch.  Education provided on dementia as a chronic, progressive, and irreversible disease. [name] shares she can now see the significant change in her mother's condition over the last month. Functional, cognitive, and nutritional status discussed as prognostic indicators.   [name] endorses significant decline in patient's health over the past week.  She also endorses signs and symptoms of visioning such as reaching up with both arms, commenting that she is having discussions with family members and friends who have passed on, and saying she is going to a family reunion but she is "not ready yet"..  We discussed patient's current illness and what it means in the larger context of patient's on-going co-morbidities. Natural disease trajectory and expectations at EOL were discussed.  I attempted to elicit values and goals of care important to the patient.  [name] shares that patient made her wishes known several years ago.  Advance directives, DNR, and boundaries of care have been made known for several years.  Patient has DNR and MOST form documented and I reviewed patient's wishes with [name].  Advance directives, concepts specific to code status, artificial  feeding and hydration, and rehospitalization were considered and discussed. [name] shares that she wants to honor her mother's wishes.  She knows her mother would not want a feeding tube, use of mechanical ventilation/life support, and would not want artificial nutrition and hydration to artificially sustain or prolong her mother's life.  Therapeutic silence, active listening, and emotional support provided. Education offered regarding concept specific to human mortality and the limitations of medical interventions to prolong life when the body begins to fail to thrive.  The difference between aggressive medical intervention and comfort care was considered in  light of the patient's goals of care.  [name] shares she does not think it makes sense to continue with PT/OT.  Her mother is unable to take pills at this time.  [name] is aware of patient's poor functional, cognitive, and nutritional status.  [name] has decided to focus on comfort and shift from aggressive medical care to a dignified, compassionate end-of-life care.  Hospice and hospice philosophy discussed.  TOC made aware of [name]'s wishes to discuss hospice options.  [name] shares that her father was taking care of by hospice several years ago and that she would like to work with the same group-[facility].  [name] is hopeful that patient can be transferred to hospice inpatient unit.  However, I shared that the hospice agency of choice will discuss disposition options along with TOC.  Discussed with [name] that patient's plan of care while hospitalized will shift to full comfort measures.  We reviewed discontinuing medications and procedures not strictly focused on comfort.  [name] was in agreement to shift to full comfort measures.  She specifically asked that patient not have PT and OT continue to attempt to work with her as it is futile.   Attending and medical team made aware.  Adjustments made to [facility].  Recommendations: -Give p.o. medications only if patient is awake alert and it is safe to give -Convert as needed medications to either sublingual, subcu, or administer rectally -DC blood draws/labs, procedures, evaluations not focused strictly on comfort -Comfort measures  Questions and concerns were addressed. The family was encouraged to call with questions or concerns.   DNR remains.  Goals are clear.  Plan is for patient to discharge with hospice services (either hospice at home or hospice inpatient unit-pending hospice eval).  PMT will remain available to patient and family throughout her hospitalization.  Please reengage with PMT if patient's goals change, at patient/family's request, or if  patient's health deteriorates during hospitalization.  Primary Decision Maker HCPOA  Physical Exam Vitals reviewed.  Constitutional:      General: She is not in acute distress.    Appearance: She is not ill-appearing or toxic-appearing.  HENT:     Head: Normocephalic.     Mouth/Throat:     Mouth: Mucous membranes are moist.  Cardiovascular:     Rate and Rhythm: Normal rate.     Pulses: Normal pulses.  Pulmonary:     Effort: Pulmonary effort is normal.  Abdominal:     Palpations: Abdomen is soft.  Musculoskeletal:     Comments: Generalized weakness  Skin:    General: Skin is warm and dry.  Neurological:     Comments: nonverbal     Palliative Assessment/Data: 20%     Thank you for this consult. Palliative medicine will continue to follow and assist holistically.   Time Total: 75 minutes Greater than 50%  of this time was spent counseling and coordinating care related to the above assessment and  plan.  Signed by: Jordan Hawks, DNP, FNP-BC Palliative Medicine    Please contact Palliative Medicine Team phone at [phone] for questions and concerns.  For individual provider: See Shea Evans

## 2022-04-26 NOTE — Progress Notes (Signed)
Initial Nutrition Assessment  DOCUMENTATION CODES:   Not applicable  INTERVENTION:   If family wishes to pursue full aggressive care, recommend NGT placement and nutrition support.   Ensure Enlive po TID, each supplement provides 350 kcal and 20 grams of protein.  Magic cup TID with meals, each supplement provides 290 kcal and 9 grams of protein  MVI po daily   Pt at high refeed risk; recommend monitor potassium, magnesium and phosphorus labs daily until stable  Bowel regimen per MD  NUTRITION DIAGNOSIS:   Inadequate oral intake related to poor appetite as evidenced by per patient/family report.  GOAL:   Patient will meet greater than or equal to 90% of their needs -not met   MONITOR:   PO intake, Supplement acceptance, Labs, Weight trends, Skin, I & O's  ASSESSMENT:   76 female with h/o dementia, HTN, CKD, DM, PAF, anxiety, depression, GERD and COPD who is admitted with UTI and sepsis.  Visited pt's room today. Pt sleeping soundly at time of RD visit so RD did not wake pt. Per chart review, pt appears weight stable pta. RD suspects pt with good oral intake at baseline. Pt is documented to be eating only sips/bites in hospital. Spoke to RN, pt refusing meals and supplements. Pt has now been without adequate nutrition for at least five days. No BM noted since 10/21; MD notified. Pt appears to be around her UBW currently. Palliative care consult pending for GOC. If family wishes to pursue full agressive care, recommend NGT placement and nutrition support. Since pt appears to have good oral intake and weight stability at baseline, temporary nutrition may support patient through the acute illness and allow a chance for her appetite to improve. Would not recommend permanent feeding tube.   Medications reviewed and include: MVI, protonix, miralax  Labs reviewed: K 3.7 wnl, Mg 1.7 wnl Cbgs- 152, 118 x 24 hrs AIC 6.1(H)- 10/11  NUTRITION - FOCUSED PHYSICAL EXAM:  Flowsheet Row  Most Recent Value  Orbital Region Mild depletion  Upper Arm Region No depletion  Thoracic and Lumbar Region No depletion  Buccal Region No depletion  Temple Region Severe depletion  Clavicle Bone Region Mild depletion  Clavicle and Acromion Bone Region Mild depletion  Scapular Bone Region No depletion  Dorsal Hand Mild depletion  Patellar Region No depletion  Anterior Thigh Region No depletion  Posterior Calf Region No depletion  Edema (RD Assessment) None  Hair Reviewed  Eyes Reviewed  Mouth Reviewed  Skin Reviewed  Nails Reviewed   Diet Order:   Diet Order             Diet regular Room service appropriate? Yes; Fluid consistency: Thin  Diet effective now                  EDUCATION NEEDS:   Education needs have been addressed  Skin:  Skin Assessment: Reviewed RN Assessment  Last BM:  04/21/22  Height:   Ht Readings from Last 1 Encounters:  04/22/22 '5\' 3"'  (1.6 m)    Weight:   Wt Readings from Last 1 Encounters:  04/26/22 72.4 kg    Ideal Body Weight:  52.3 kg  BMI:  Body mass index is 28.27 kg/m.  Estimated Nutritional Needs:   Kcal:  1400-1600kcal/day  Protein:  70-80g/day  Fluid:  1.4-1.6L/day  Koleen Distance MS, RD, LDN Please refer to [facility] for RD and/or RD on-call/weekend/after hours pager

## 2022-04-26 NOTE — Progress Notes (Addendum)
[facility]) Hospital Liaison Note  Received request from Jordan Hawks, NP for family interest in [name].  Met with daughter Erica Donovan at bedside to discuss hospice services both in inpatient hospice unit and at home or LTC. At this time, Erica Donovan does not meet inpatient eligibility criteria.  We did discuss hospice at home and/or LTC, she will be following up with hospital case management to discuss discharge planning. Erica Donovan is aware that we are available to support her with hospice services one a discharge disposition has been determined.  [place] will continue to follow.  Thank you for the opportunity to participate in this patients care.  Please call with any hospice related questions or concerns.  Thank you, Margaretmary Eddy, BSN, RN [facility] Liaison [phone]

## 2022-04-26 NOTE — TOC Initial Note (Signed)
Transition of Care [facility]) - Initial/Assessment Note    Patient Details  Name: Erica Donovan MRN: [id number] Date of Birth: 04-03-1926  Transition [facility]) CM/SW Contact:    Beverly Sessions, RN Phone Number: 04/26/2022, 2:44 PM  Clinical Narrative:                  Per Curt Bears with Palliative family requesting residential hospice in [place] and she as notified Olivia Mackie with [employer]        Patient Goals and CMS Choice        Expected Discharge Plan and Services                                                Prior Living Arrangements/Services                       Activities of Daily Living      Permission Sought/Granted                  Emotional Assessment              Admission diagnosis:  Elevated LFTs [R79.89] AKI (acute kidney injury) ([place]) [N17.9] Severe sepsis ([place]) [A41.9, R65.20] Altered mental status, unspecified altered mental status type [R41.82] Patient Active Problem List   Diagnosis Date Noted   Transaminitis 04/22/2022   Hyperbilirubinemia 04/22/2022   AKI (acute kidney injury) ([place]) 04/22/2022   Elevated troponin 04/22/2022   Known medical problems 04/22/2022   Severe sepsis ([place]) 04/21/2022   Paroxysmal atrial fibrillation ([place]) 05/04/2020   Acute encephalopathy 04/11/2020   AMS (altered mental status) 04/10/2020   Postural dizziness with near syncope 03/02/2019   Anxiety 08/06/2015   Gastroesophageal reflux disease without esophagitis 08/06/2015   Diabetes mellitus type 2, uncomplicated ([place]) 62/56/3893   Chronic kidney disease 03/17/2014   Benign essential hypertension 12/13/2013   PCP:  Juluis Pitch, MD Pharmacy:   [facility] [place], [address] [place] [address] Phone: [phone] Fax: [phone]     Social Determinants of Health (SDOH) Interventions    Readmission  Risk Interventions     No data to display

## 2022-04-27 DIAGNOSIS — Z7189 Other specified counseling: Secondary | ICD-10-CM

## 2022-04-27 DIAGNOSIS — R6520 Severe sepsis without septic shock: Secondary | ICD-10-CM | POA: Diagnosis not present

## 2022-04-27 DIAGNOSIS — A419 Sepsis, unspecified organism: Secondary | ICD-10-CM | POA: Diagnosis not present

## 2022-04-27 LAB — CULTURE, BLOOD (ROUTINE X 2)
Culture: NO GROWTH
Special Requests: ADEQUATE

## 2022-04-27 MED ORDER — MORPHINE SULFATE (CONCENTRATE) 10 MG/0.5ML PO SOLN: 10.0000 mg | Freq: Four times a day (QID) | ORAL | 0 refills | Status: DC | PRN

## 2022-04-27 MED ORDER — MORPHINE SULFATE (CONCENTRATE) 10 MG/0.5ML PO SOLN: 10.0000 mg | Freq: Four times a day (QID) | ORAL | 0 refills | Status: AC | PRN

## 2022-04-27 MED ORDER — MORPHINE SULFATE (CONCENTRATE) 10 MG/0.5ML PO SOLN
10.0000 mg | ORAL | Status: DC | PRN
  Administered 2022-04-27 – 2022-04-28 (×3): 10 mg via ORAL
  Filled 2022-04-27 (×3): qty 0.5

## 2022-04-27 MED ORDER — MORPHINE SULFATE (PF) 2 MG/ML IV SOLN: 1.0000 mg | INTRAVENOUS | Status: DC | PRN

## 2022-04-27 NOTE — TOC Initial Note (Signed)
Transition of Care [facility]) - Initial/Assessment Note    Patient Details  Name: Erica Donovan MRN: [id number] Date of Birth: May 25, 1926  Transition of Care [facility]) CM/SW Contact:    Beverly Sessions, RN Phone Number: 04/27/2022, 11:42 AM  Clinical Narrative:                  Admitted CZY:[id number] encephalopathy secondary to severe sepsis and complicated UTI Admitted from: home alone UXN:[id number] Current home health/prior home health/DME: hospital bed, shower chair, rails, bsc RW  Per Olivia Mackie with [employer] patient is not candidate for inpatient hospice at this time Met with daughter at bedside.  Confirmed she is not interested in private pay at SNF with hospice.  She confirms plan will be home with hospice.  Daughter to stay with patient. Denies need for DME at discharge  Signed DNR on chart. Confirmed address on file correct for EMS transport when ready for discharge         Patient Goals and CMS Choice        Expected Discharge Plan and Services                                                Prior Living Arrangements/Services                       Activities of Daily Living      Permission Sought/Granted                  Emotional Assessment              Admission diagnosis:  Elevated LFTs [R79.89] AKI (acute kidney injury) ([place]) [N17.9] Severe sepsis ([place]) [A41.9, R65.20] Altered mental status, unspecified altered mental status type [R41.82] Patient Active Problem List   Diagnosis Date Noted   Transaminitis 04/22/2022   Hyperbilirubinemia 04/22/2022   AKI (acute kidney injury) ([place]) 04/22/2022   Elevated troponin 04/22/2022   Known medical problems 04/22/2022   Severe sepsis ([place]) 04/21/2022   Paroxysmal atrial fibrillation ([place]) 05/04/2020   Acute encephalopathy 04/11/2020   AMS (altered mental status) 04/10/2020   Postural dizziness with near syncope 03/02/2019   Anxiety 08/06/2015   Gastroesophageal  reflux disease without esophagitis 08/06/2015   Diabetes mellitus type 2, uncomplicated ([place]) 25/42/7062   Chronic kidney disease 03/17/2014   Benign essential hypertension 12/13/2013   PCP:  Juluis Pitch, MD Pharmacy:   [facility] [id number] [place] [address] Phone: [phone] Fax: [phone]     Social Determinants of Health (SDOH) Interventions    Readmission Risk Interventions     No data to display

## 2022-04-27 NOTE — Progress Notes (Signed)
  PROGRESS NOTE    Erica Donovan  NID:[id number] DOB: 1925-11-03 DOA: 04/21/2022 PCP: Juluis Pitch, MD  214A/214A-AA  LOS: 6 days   Brief hospital course:   Assessment & Plan: Erica Donovan is a 88 year old with history of P A-fib, DM 2, CKD stage II, HTN, dementia, recurrent UTI admitted for lethargy.  In the ED she was found to have acute kidney injury and urinary tract infection.  Comfort measures --palliative care consulted, and daughter decided on comfort care and hospice  --concentrated liquid morphine for pain  Metabolic encephalopathy secondary to severe sepsis and complicated UTI CT head negative.  Mentation slowly improving - Urine culture not sent upon admission.  But outpatient urine cultures reviewed from 10/5 which grew pansensitive E. coli.   --received 3 days of ceftriaxone which completed the tx course.   Baseline dementia --Pt had baseline dementia, though mild and was mostly independent with family help PTA.  Has been having gradual decline and reduced oral intake. --palliative consulted, per daughter request  AKI, resolved Baseline creatinine 1.1.  Admission creatinine 2.12.  Resolved with IV fluids   Transaminitis Cholelithiasis without any evidence of cholecystitis In setting of sepsis.  LFTs improved.   Atrial fibrillation, paroxysmal Not on anticoagulation due to risk of fall and subdural hematoma per outpatient Cardiology [facility] at [facility].  --amiodarone d/c'ed due to comfort care status   Hx of Postural dizziness/hypotension - On midodrine PTA, discontinued due to elevated blood pressure.   Anxiety and depression -- Lexapro d/c'ed due to comfort care status   Troponin with minimal elevation Likely demand ischemia    DVT prophylaxis: Lovenox SQ Code Status: DNR  Family Communication: daughter updated at bedside today Level of care: Med-Surg Dispo:   The patient is from: home Anticipated d/c is to: home with hospice Anticipated d/c date  is: tomorrow   Subjective and Interval History:  Pt was more alert today, wanted to go home.  Still does not want anything to eat or drink.  Complained of pain, gave concentrated liquid morphine.   Objective: Vitals:   04/26/22 0800 04/26/22 1701 04/26/22 2007 04/27/22 0806  BP:  101/76 (!) 116/57 (!) 174/83  Pulse:  67 91 96  Resp:  '16 19 18  '$ Temp:  98.1 F (36.7 C) 97.8 F (36.6 C) 98.2 F (36.8 C)  TempSrc:  Oral Oral Oral  SpO2:  98% 93% 97%  Weight: 72.4 kg     Height:        Intake/Output Summary (Last 24 hours) at 04/27/2022 1625 Last data filed at 04/27/2022 1408 Gross per 24 hour  Intake 0 ml  Output 300 ml  Net -300 ml   Filed Weights   04/21/22 2017 04/26/22 0800  Weight: 74.8 kg 72.4 kg    Examination:   Constitutional: NAD, alert, oriented to person and place HEENT: conjunctivae and lids normal, EOMI CV: No cyanosis.   RESP: normal respiratory effort, on RA Neuro: II - XII grossly intact.     Data Reviewed: I have personally reviewed labs and imaging studies  Time spent: 35 minutes  Enzo Bi, MD [facility] If 7PM-7AM, please contact night-coverage 04/27/2022, 4:25 PM

## 2022-04-27 NOTE — Progress Notes (Signed)
Daily Progress Note   Patient Name: Erica Donovan       Date: 04/27/2022 DOB: Apr 15, 1926  Age: 88 y.o. MRN#: [id number] Attending Physician: Enzo Bi, MD Primary Care Physician: Juluis Pitch, MD Admit Date: 04/21/2022  Reason for Consultation/Follow-up: Pain control  Subjective: Request made by colleague [place] NP to assess patient today for symptom management needs.  In to see patient.  Family member at bedside.  Patient is resting calmly and comfortably in bed.  Per daughter patient has been seeing small children playing in the woods and other things that are not present in the room.  She states patient has requested that she be quiet and not talk.  She states that her mother has not wanted anything to eat or drink.  Patient denies pain at this time.  No changes recommended to symptom management at this time.  TOC in to talk patient.  Length of Stay: 6  Current Medications: Scheduled Meds:   sodium chloride flush  3 mL Intravenous Q12H    Continuous Infusions:   PRN Meds: acetaminophen, antiseptic oral rinse, glycopyrrolate **OR** glycopyrrolate **OR** glycopyrrolate, haloperidol **OR** haloperidol **OR** haloperidol lactate, hydrALAZINE, morphine CONCENTRATE, [DISCONTINUED] ondansetron **OR** ondansetron (ZOFRAN) IV, mouth rinse, polyvinyl alcohol  Physical Exam Pulmonary:     Effort: Pulmonary effort is normal.  Neurological:     Mental Status: She is alert.             Vital Signs: BP (!) 174/83 (BP Location: Left Arm)   Pulse 96   Temp 98.2 F (36.8 C) (Oral)   Resp 18   Ht '5\' 3"'$  (1.6 m)   Wt 72.4 kg   SpO2 97%   BMI 28.27 kg/m  SpO2: SpO2: 97 % O2 Device: O2 Device: Room Air O2 Flow Rate: O2 Flow Rate (L/min): 2 L/min  Intake/output summary:   Intake/Output Summary (Last 24 hours) at 04/27/2022 1303 Last data filed at 04/26/2022 1916 Gross per 24 hour  Intake 0 ml  Output 300 ml  Net -300 ml   LBM: Last BM Date : 04/21/22 Baseline Weight: Weight: 74.8 kg Most recent weight: Weight: 72.4 kg    Patient Active Problem List   Diagnosis Date Noted   Transaminitis 04/22/2022   Hyperbilirubinemia 04/22/2022   AKI (acute kidney injury) ([place]) 04/22/2022   Elevated troponin  04/22/2022   Known medical problems 04/22/2022   Severe sepsis ([place]) 04/21/2022   Paroxysmal atrial fibrillation ([place]) 05/04/2020   Acute encephalopathy 04/11/2020   AMS (altered mental status) 04/10/2020   Postural dizziness with near syncope 03/02/2019   Anxiety 08/06/2015   Gastroesophageal reflux disease without esophagitis 08/06/2015   Diabetes mellitus type 2, uncomplicated ([place]) 79/89/2119   Chronic kidney disease 03/17/2014   Benign essential hypertension 12/13/2013    Palliative Care Assessment & Plan   Recommendations/Plan: No changes recommended to symptom man judgment at this time.  Code Status:    Code Status Orders  (From admission, onward)           Start     Ordered   04/26/22 1346  Do not attempt resuscitation (DNR)  Continuous       Question Answer Comment  In the event of cardiac or respiratory ARREST Do not call a "code blue"   In the event of cardiac or respiratory ARREST Do not perform Intubation, CPR, defibrillation or ACLS   In the event of cardiac or respiratory ARREST Use medication by any route, position, wound care, and other measures to relive pain and suffering. May use oxygen, suction and manual treatment of airway obstruction as needed for comfort.      04/26/22 1346           Code Status History     Date Active Date Inactive Code Status Order ID Comments User Context   04/22/2022 0057 04/26/2022 1346 DNR [id number]  Clarnce Flock, MD ED   04/12/2020 1052 04/18/2020 2245 DNR [id number]   Jimmy Footman, NP ED   04/10/2020 2321 04/12/2020 1052 Full Code [id number]  Mansy, Arvella Merles, MD ED   04/10/2020 2321 04/10/2020 2321 Full Code [id number]  Mansy, Arvella Merles, MD ED   03/02/2019 1542 03/04/2019 1715 Full Code [id number]  Lang Snow, NP ED        Thank you for allowing the Palliative Medicine Team to assist in the care of this patient.  Asencion Gowda, NP  Please contact Palliative Medicine Team phone at [phone] for questions and concerns.

## 2022-04-27 NOTE — Care Management Important Message (Signed)
Important Message  Patient Details  Name: Erica Donovan MRN: [id number] Date of Birth: 12/23/1925   Medicare Important Message Given:  Other (see comment)  Disposition to discharge home with hospice services.  Medicare IM withheld at this time out of respect for patient and family.   Dannette Barbara 04/27/2022, 2:23 PM

## 2022-04-27 NOTE — Progress Notes (Signed)
[facility] ([facility]) Hospital Liaison Note  Received request from Kathryn Strup, NP for family interest in Hospice Home.   Met with daughter [name] at bedside to discuss hospice services both in inpatient hospice unit and at home or LTC. At this time, Ms. Warrell does not meet inpatient eligibility criteria. Hospice Home does not have any routine beds to offer at this time.   Plan is for patient to return home with hospice services. No DME needed at this time Patient will need ambulance transport at time of discharge. Per Dr. Lai, likely Saturday 10.28.  Please send completed and signed DNR with patient at time of discharge.   Please provide prescriptions at discharge to as needed to ensure comfort management.   [facility] Hospital Liaison will continue to follow.   Thank you for the opportunity to participate in this patients care.   Please call with any hospice related questions or concerns.   Thank you, Tracy Ennis, BSN, RN [facility] Hospital Liaison [phone] 

## 2022-04-28 DIAGNOSIS — A419 Sepsis, unspecified organism: Secondary | ICD-10-CM | POA: Diagnosis not present

## 2022-04-28 DIAGNOSIS — R6520 Severe sepsis without septic shock: Secondary | ICD-10-CM | POA: Diagnosis not present

## 2022-04-28 NOTE — Discharge Summary (Signed)
Physician Discharge Summary   BIVIANA SADDLER  female DOB: 1926/01/23  VOJ:[id number]  PCP: Juluis Pitch, MD  Admit date: 04/21/2022 Discharge date: 04/28/2022  Admitted From: home Disposition:  Home with hospice CODE STATUS: DNR   Hospital Course:  For full details, please see H&P, progress notes, consult notes and ancillary notes.  Briefly,  Erica Donovan is a 88 year old with history of P A-fib, DM 2, CKD stage II, HTN, dementia, recurrent UTI admitted for lethargy.  In the ED she was found to have acute kidney injury and urinary tract infection.   Comfort measures --palliative care consulted, because pt declined any oral intake.  Daughter decided on comfort care and hospice. --concentrated liquid morphine for pain   Metabolic encephalopathy secondary to severe sepsis and complicated UTI CT head negative.  Mentation slowly improving - Urine culture not sent upon admission.  But outpatient urine cultures reviewed from 10/5 which grew pansensitive E. coli.   --received 3 days of ceftriaxone which completed the tx course.   Baseline dementia --Pt had baseline dementia, though mild and was mostly independent with family help PTA.  Has been having gradual decline and reduced oral intake. --palliative consulted, per daughter request   AKI, resolved Baseline creatinine 1.1.  Admission creatinine 2.12.  Resolved with IV fluids   Transaminitis Cholelithiasis without any evidence of cholecystitis In setting of sepsis.  LFTs improved.   Atrial fibrillation, paroxysmal Not on anticoagulation due to risk of fall and subdural hematoma per outpatient Cardiology note at [facility].  --amiodarone d/c'ed due to comfort care status   Hx of Postural dizziness/hypotension - On midodrine PTA, discontinued due to elevated blood pressure.   Anxiety and depression -- Lexapro d/c'ed due to comfort care status   Troponin with minimal elevation Likely demand ischemia    Discharge  Diagnoses:  Principal Problem:   Severe sepsis ([place]) Active Problems:   Acute encephalopathy   Benign essential hypertension   Chronic kidney disease   Diabetes mellitus type 2, uncomplicated (HCC)   Paroxysmal atrial fibrillation (HCC)   Transaminitis   Hyperbilirubinemia   AKI (acute kidney injury) ([place])   Elevated troponin   Known medical problems   30 Day Unplanned Readmission Risk Score    Flowsheet Row ED to Hosp-Admission (Current) from 04/21/2022 in [facility]  30 Day Unplanned Readmission Risk Score (%) 11.94 Filed at 04/28/2022 0400       This score is the patient's risk of an unplanned readmission within 30 days of being discharged (0 -100%). The score is based on dignosis, age, lab data, medications, orders, and past utilization.   Low:  0-14.9   Medium: 15-21.9   High: 22-29.9   Extreme: 30 and above         Discharge Instructions:  Allergies as of 04/28/2022       Reactions   Penicillins Nausea Only   Did it involve swelling of the face/tongue/throat, SOB, or low BP? Unknown Did it involve sudden or severe rash/hives, skin peeling, or any reaction on the inside of your mouth or nose? Unknown Did you need to seek medical attention at a hospital or doctor's office? Unknown When did it last happen?  Many years ago.  If all above answers are "NO", may proceed with cephalosporin use.   Ciprofloxacin Nausea Only   Nitrofurantoin Itching, Rash        Medication List     STOP taking these medications    acetaminophen 500 MG  tablet Commonly known as: TYLENOL   amiodarone 200 MG tablet Commonly known as: PACERONE   atorvastatin 10 MG tablet Commonly known as: LIPITOR   escitalopram 5 MG tablet Commonly known as: LEXAPRO   ketoconazole 2 % shampoo Commonly known as: NIZORAL   loratadine 10 MG tablet Commonly known as: CLARITIN   midodrine 5 MG tablet Commonly known as: PROAMATINE   montelukast 10 MG  tablet Commonly known as: SINGULAIR   multivitamin with minerals Tabs tablet   omeprazole 20 MG capsule Commonly known as: PRILOSEC   sulfamethoxazole-trimethoprim 800-160 MG tablet Commonly known as: BACTRIM DS       TAKE these medications    morphine CONCENTRATE 10 MG/0.5ML Soln concentrated solution Take 0.5 mLs (10 mg total) by mouth every 6 (six) hours as needed for up to 7 days for severe pain or moderate pain.          Allergies  Allergen Reactions   Penicillins Nausea Only    Did it involve swelling of the face/tongue/throat, SOB, or low BP? Unknown Did it involve sudden or severe rash/hives, skin peeling, or any reaction on the inside of your mouth or nose? Unknown Did you need to seek medical attention at a hospital or doctor's office? Unknown When did it last happen?  Many years ago.  If all above answers are "NO", may proceed with cephalosporin use.    Ciprofloxacin Nausea Only   Nitrofurantoin Itching and Rash     The results of significant diagnostics from this hospitalization (including imaging, microbiology, ancillary and laboratory) are listed below for reference.   Consultations:   Procedures/Studies: DG Abd 1 View  Result Date: 04/26/2022 CLINICAL DATA:  Abdominal pain.  Assess for stool impaction. EXAM: ABDOMEN - 1 VIEW COMPARISON:  CT abdomen and pelvis 04/21/2022 FINDINGS: Nonobstructed bowel-gas pattern. Minimal stool in the region of the rectum. No significant stool within the rest of the colon. No dilated loops of bowel to indicate bowel obstruction. No portal venous gas or pneumatosis. Calcific densities overlying the mid liver likely correspond to the layering calcified gallstone seen on CT. Moderate to high-grade splenic arterial calcifications are again noted. The lung bases are clear. Mild levocurvature of the mid to lower lumbar spine with moderate degenerative disc changes. IMPRESSION: Nonobstructed bowel-gas pattern. Minimal stool in  the region of the rectum. No suggestion of rectal stool impaction. Electronically Signed   By: Yvonne Kendall M.D.   On: 04/26/2022 13:15   CT ABDOMEN PELVIS WO CONTRAST  Result Date: 04/21/2022 CLINICAL DATA:  Altered mental status. Elevated liver function studies. EXAM: CT ABDOMEN AND PELVIS WITHOUT CONTRAST TECHNIQUE: Multidetector CT imaging of the abdomen and pelvis was performed following the standard protocol without IV contrast. RADIATION DOSE REDUCTION: This exam was performed according to the departmental dose-optimization program which includes automated exposure control, adjustment of the mA and/or kV according to patient size and/or use of iterative reconstruction technique. COMPARISON:  10/15/2019 FINDINGS: Lower chest: Atelectasis in the lung bases.  Cardiac enlargement. Hepatobiliary: Cholelithiasis with multiple stones in the gallbladder. No gallbladder wall thickening or infiltration. No bile duct dilatation. No focal liver lesions. Pancreas: Pancreatic atrophy with scattered calcifications consistent with chronic pancreatitis. No acute changes. Spleen: Normal in size without focal abnormality. Adrenals/Urinary Tract: No adrenal gland nodules. Bilateral renal cysts. Largest is in the lower pole of the right kidney measuring 3.9 cm diameter. No follow-up imaging is indicated. No hydronephrosis or hydroureter. Bladder is normal. Stomach/Bowel: Stomach, small bowel, and colon are not  abnormally distended. No wall thickening or inflammatory changes. Diverticulosis of the sigmoid colon without evidence of acute diverticulitis. Appendix is not identified. Vascular/Lymphatic: Aortic atherosclerosis. No enlarged abdominal or pelvic lymph nodes. Reproductive: Status post hysterectomy. No adnexal masses. Other: No abdominal wall hernia or abnormality. No abdominopelvic ascites. Musculoskeletal: Degenerative changes.  No acute bony abnormalities. IMPRESSION: 1. Cholelithiasis without evidence of acute  cholecystitis. 2. Changes of chronic pancreatitis.  No acute changes. 3. No evidence of bowel obstruction or inflammation. 4. Aortic atherosclerosis. Electronically Signed   By: Lucienne Capers M.D.   On: 04/21/2022 23:22   CT HEAD WO CONTRAST (5MM)  Result Date: 04/21/2022 CLINICAL DATA:  Head trauma. EXAM: CT HEAD WITHOUT CONTRAST TECHNIQUE: Contiguous axial images were obtained from the base of the skull through the vertex without intravenous contrast. RADIATION DOSE REDUCTION: This exam was performed according to the departmental dose-optimization program which includes automated exposure control, adjustment of the mA and/or kV according to patient size and/or use of iterative reconstruction technique. COMPARISON:  Head CT dated 04/10/2020. FINDINGS: Brain: Moderate age-related atrophy and chronic microvascular ischemic changes. There is no acute intracranial hemorrhage. No mass effect or midline shift. No extra-axial fluid collection. Vascular: No hyperdense vessel or unexpected calcification. Skull: No acute calvarial pathology. Sinuses/Orbits: Partial opacification of the right maxillary sinus which may represent a retention cyst or polyp. No air-fluid level. The mastoid air cells are clear. Other: None IMPRESSION: 1. No acute intracranial pathology. 2. Moderate age-related atrophy and chronic microvascular ischemic changes. Electronically Signed   By: Anner Crete M.D.   On: 04/21/2022 22:30   DG Chest 2 View  Result Date: 04/21/2022 CLINICAL DATA:  Urinary tract infection EXAM: CHEST - 2 VIEW COMPARISON:  04/10/2020 FINDINGS: Lungs are well expanded, symmetric, and clear. No pneumothorax or pleural effusion. Cardiac size within normal limits. Pulmonary vascularity is normal. Osseous structures are age-appropriate. No acute bone abnormality. IMPRESSION: No active cardiopulmonary disease. Electronically Signed   By: Fidela Salisbury M.D.   On: 04/21/2022 21:29      Labs: BNP (last 3  results) No results for input(s): "BNP" in the last 8760 hours. Basic Metabolic Panel: Recent Labs  Lab 04/22/22 0529 04/23/22 0422 04/24/22 0514 04/25/22 0456 04/26/22 0508  NA 136 137 138 140 140  K 4.5 4.7 4.7 3.7 3.7  CL 105 105 102 106 102  CO2 '24 24 26 23 23  '$ GLUCOSE 178* 98 127* 68* 112*  BUN 49* 33* 26* 23 18  CREATININE 1.73* 1.32* 1.10* 1.05* 0.98  CALCIUM 8.8* 9.5 10.0 9.8 9.7  MG  --   --  1.8 1.9 1.7   Liver Function Tests: Recent Labs  Lab 04/21/22 2020 04/22/22 0529 04/23/22 0422 04/24/22 0514  AST 270* 136* 81* 41  ALT 396* 232* 189* 136*  ALKPHOS 161* 111 143* 166*  BILITOT 1.9* 1.6* 1.4* 1.2  PROT 7.5 5.1* 5.5* 6.2*  ALBUMIN 3.9 2.6* 2.7* 2.9*   No results for input(s): "LIPASE", "AMYLASE" in the last 168 hours. Recent Labs  Lab 04/23/22 1011  AMMONIA 10   CBC: Recent Labs  Lab 04/21/22 2020 04/22/22 0529 04/23/22 0422 04/24/22 0514 04/25/22 0456  WBC 13.6* 8.8 5.6 6.5 7.7  NEUTROABS 11.8*  --   --   --   --   HGB 14.0 10.3* 10.8* 11.7* 13.6  HCT 44.2 31.8* 33.4* 36.9 40.4  MCV 100.7* 99.1 98.8 99.2 95.1  PLT 227 181 179 192 198   Cardiac Enzymes: No results for  input(s): "CKTOTAL", "CKMB", "CKMBINDEX", "TROPONINI" in the last 168 hours. BNP: Invalid input(s): "POCBNP" CBG: No results for input(s): "GLUCAP" in the last 168 hours. D-Dimer No results for input(s): "DDIMER" in the last 72 hours. Hgb A1c No results for input(s): "HGBA1C" in the last 72 hours. Lipid Profile No results for input(s): "CHOL", "HDL", "LDLCALC", "TRIG", "CHOLHDL", "LDLDIRECT" in the last 72 hours. Thyroid function studies No results for input(s): "TSH", "T4TOTAL", "T3FREE", "THYROIDAB" in the last 72 hours.  Invalid input(s): "FREET3" Anemia work up No results for input(s): "VITAMINB12", "FOLATE", "FERRITIN", "TIBC", "IRON", "RETICCTPCT" in the last 72 hours. Urinalysis    Component Value Date/Time   COLORURINE AMBER (A) 04/22/2022 0046    APPEARANCEUR CLOUDY (A) 04/22/2022 0046   APPEARANCEUR Clear 03/02/2013 1333   LABSPEC 1.021 04/22/2022 0046   LABSPEC 1.009 03/02/2013 1333   PHURINE 5.0 04/22/2022 0046   GLUCOSEU NEGATIVE 04/22/2022 0046   GLUCOSEU Negative 03/02/2013 1333   HGBUR NEGATIVE 04/22/2022 0046   BILIRUBINUR NEGATIVE 04/22/2022 0046   BILIRUBINUR Negative 03/02/2013 1333   KETONESUR NEGATIVE 04/22/2022 0046   PROTEINUR 100 (A) 04/22/2022 0046   NITRITE NEGATIVE 04/22/2022 0046   LEUKOCYTESUR SMALL (A) 04/22/2022 0046   LEUKOCYTESUR Negative 03/02/2013 1333   Sepsis Labs Recent Labs  Lab 04/22/22 0529 04/23/22 0422 04/24/22 0514 04/25/22 0456  WBC 8.8 5.6 6.5 7.7   Microbiology Recent Results (from the past 240 hour(s))  Blood culture (routine x 2)     Status: None   Collection Time: 04/21/22  9:58 PM   Specimen: BLOOD  Result Value Ref Range Status   Specimen Description BLOOD BLOOD RIGHT FOREARM  Final   Special Requests   Final    BOTTLES DRAWN AEROBIC AND ANAEROBIC Blood Culture adequate volume   Culture   Final    NO GROWTH 5 DAYS Performed at [facility], [address] [place], [place] [address]    Report Status 04/26/2022 FINAL  Final  Blood culture (routine x 2)     Status: None   Collection Time: 04/22/22 12:46 AM   Specimen: BLOOD LEFT ARM  Result Value Ref Range Status   Specimen Description BLOOD LEFT ARM  Final   Special Requests   Final    BOTTLES DRAWN AEROBIC AND ANAEROBIC Blood Culture adequate volume   Culture   Final    NO GROWTH 5 DAYS Performed at [facility], [address], [place], [place] [address]    Report Status 04/27/2022 FINAL  Final     Total time spend on discharging this patient, including the last patient exam, discussing the hospital stay, instructions for ongoing care as it relates to all pertinent caregivers, as well as preparing the medical discharge records, prescriptions, and/or referrals as applicable, is 25  minutes.    Enzo Bi, MD  [facility] 04/28/2022, 7:45 AM

## 2022-04-28 NOTE — Progress Notes (Signed)
Erica Donovan to be D/C'd Home with hospice per MD order.  Discussed prescriptions and follow up appointments with the patient. Prescriptions given to patient, medication list explained in detail. Pt verbalized understanding.  Allergies as of 04/28/2022       Reactions   Penicillins Nausea Only   Did it involve swelling of the face/tongue/throat, SOB, or low BP? Unknown Did it involve sudden or severe rash/hives, skin peeling, or any reaction on the inside of your mouth or nose? Unknown Did you need to seek medical attention at a hospital or doctor's office? Unknown When did it last happen?  Many years ago.  If all above answers are "NO", may proceed with cephalosporin use.   Ciprofloxacin Nausea Only   Nitrofurantoin Itching, Rash        Medication List     STOP taking these medications    acetaminophen 500 MG tablet Commonly known as: TYLENOL   amiodarone 200 MG tablet Commonly known as: PACERONE   atorvastatin 10 MG tablet Commonly known as: LIPITOR   escitalopram 5 MG tablet Commonly known as: LEXAPRO   ketoconazole 2 % shampoo Commonly known as: NIZORAL   loratadine 10 MG tablet Commonly known as: CLARITIN   midodrine 5 MG tablet Commonly known as: PROAMATINE   montelukast 10 MG tablet Commonly known as: SINGULAIR   multivitamin with minerals Tabs tablet   omeprazole 20 MG capsule Commonly known as: PRILOSEC   sulfamethoxazole-trimethoprim 800-160 MG tablet Commonly known as: BACTRIM DS       TAKE these medications    morphine CONCENTRATE 10 MG/0.5ML Soln concentrated solution Take 0.5 mLs (10 mg total) by mouth every 6 (six) hours as needed for up to 7 days for severe pain or moderate pain.        Vitals:   04/27/22 1909 04/28/22 0824  BP: 132/73 (!) 196/102  Pulse: 100 90  Resp: 16 18  Temp:  97.7 F (36.5 C)  SpO2: 94% 99%    Skin clean, dry and intact without evidence of skin break down, no evidence of skin tears noted. IV  catheter discontinued intact. Site without signs and symptoms of complications. Dressing and pressure applied. Pt denies pain at this time. No complaints noted.  An After Visit Summary was printed and given to the patient. Patient escorted via EMS home with hospice.  [place] C. Deatra Ina

## 2022-04-28 NOTE — TOC Transition Note (Signed)
Transition of Care [facility]) - CM/SW Discharge Note   Patient Details  Name: Erica Donovan MRN: [id number] Date of Birth: 14-Jan-1926  [facility] CM/SW Contact:  Valente David, RN Phone Number: 04/28/2022, 10:21 AM   Clinical Narrative:     Spoke with daughter [name], aware of patient discharge home with hospice today.  State [facility] has been in contact with her today an will make first home visit tomorrow.  EMS transport set up for after lunch, 1230, today per daughter's request, bedside nurse aware.  Daughter will meet them at the home.  Daughter also requested to have transport chair instead of stretcher due to the size of the home and difficulty getting stretcher inside.  EMS will transport to [address], [place], [place] [address].  No further TOC needs, will sign off.  Final next level of care: Home w Hospice Care Barriers to Discharge: Barriers Resolved   Patient Goals and CMS Choice Patient states their goals for this hospitalization and ongoing recovery are:: Home with hospice CMS Medicare.gov Compare Post Acute Care list provided to:: Patient Represenative (must comment) Lysbeth Penner)    Discharge Placement                  Name of family member notified: Lysbeth Penner Patient and family notified of of transfer: 04/28/22  Discharge Plan and [place] with hospice.                     Social Determinants of Health (SDOH) Interventions     Readmission Risk Interventions     No data to display

## 2022-06-01 DEATH — deceased

## 2023-02-06 IMAGING — MR MR HEAD W/O CM
13 series · 47 of 48 positions shown · non-contrast
Comparison: Brain MRI 04/11/2020.

CLINICAL DATA: Dizziness R42 ([id number]). Additional history
provided by scanning technologist: Patient reports headaches and
dizziness for 2.5 weeks, previous fall 3 months ago.

EXAM:
MRI HEAD WITHOUT CONTRAST
TECHNIQUE: Multiplanar, multiecho pulse sequences of the brain and surrounding
structures were obtained without intravenous contrast.

[Series 5: ax dwi_tracew · axial · 3.0mm · 0.65mm/px · z∈[-100,+53]mm · 2 of 48 slices shown]
[im 1/48]
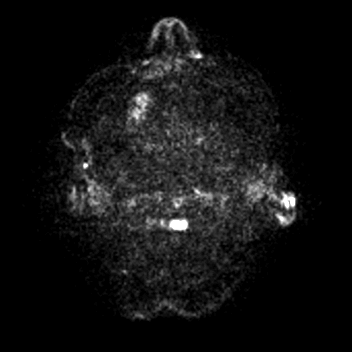
[im 48/48]
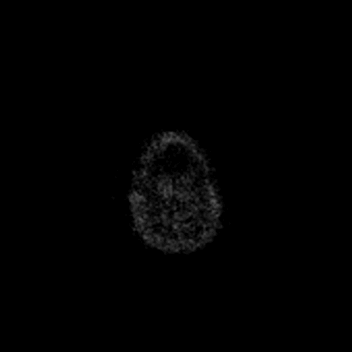

[Series 6: ax dwi_adc · axial · 3.0mm · 0.65mm/px · z∈[-100,+49]mm · 2 of 47 slices shown]
[im 1/47]
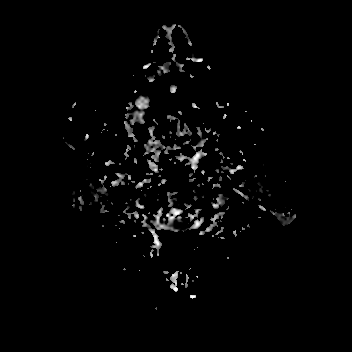
[im 47/47]
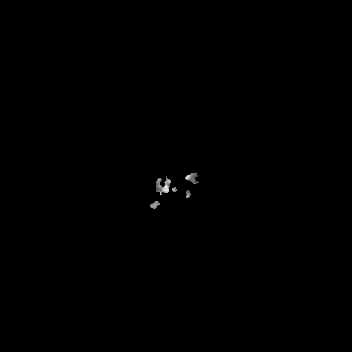

[Series 7: cor dwi_tracew · coronal · 5.0mm · 0.60mm/px · 3 of 38 slices shown]
[im 1/38]
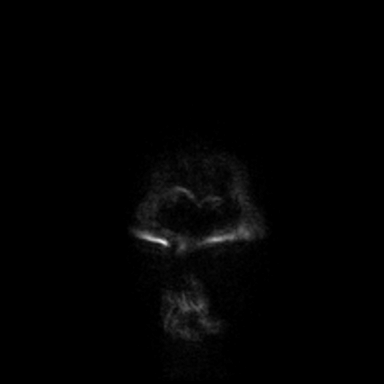
[im 19/38]
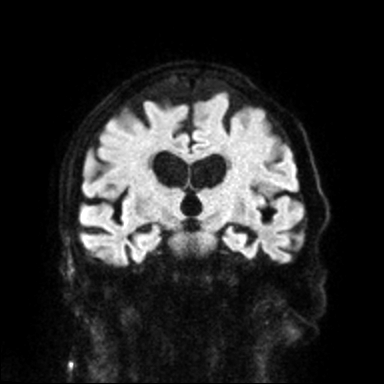
[im 38/38]
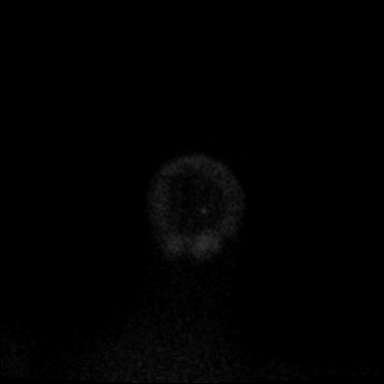

[Series 8: cor dwi_adc · coronal · 5.0mm · 0.60mm/px · 3 of 37 slices shown]
[im 1/37]
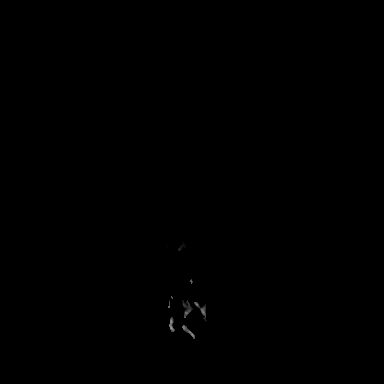
[im 19/37]
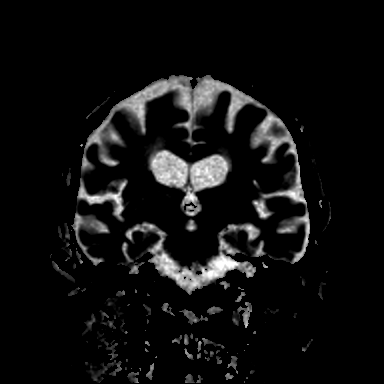
[im 37/37]
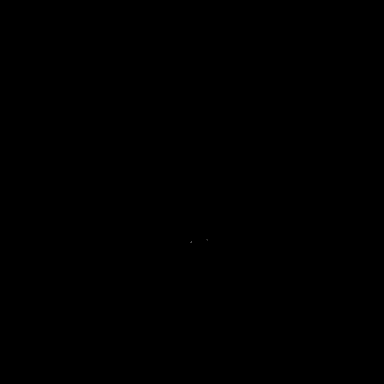

[Series 9: T1 · sagittal · 5.0mm · 0.62mm/px · 2 of 23 slices shown (1 of 2)]
[im 1/23]
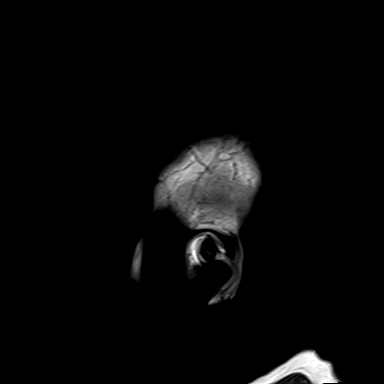
[im 23/23]
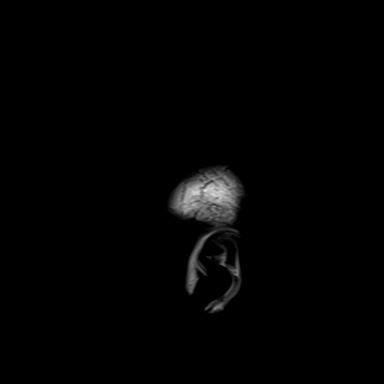

[Series 10: T2 · axial · 5.0mm · 0.53mm/px · z∈[-94,+48]mm · 2 of 25 slices shown (1 of 2)]
[im 1/25]
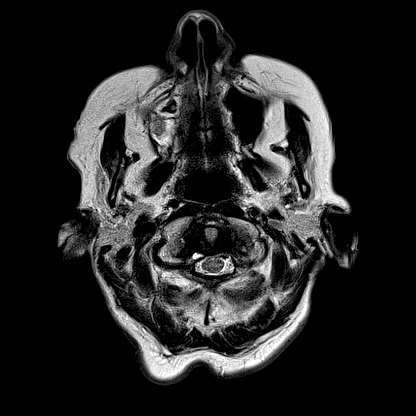
[im 25/25]
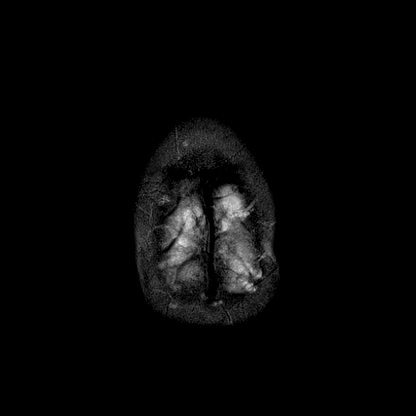

[Series 11: mag_images · axial · 3.0mm · 0.90mm/px · z∈[-109,+65]mm · 4 of 60 slices shown]
[im 1/60]
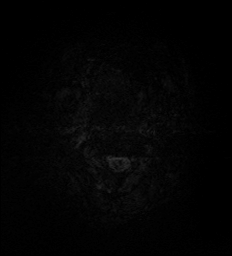
[im 20/60]
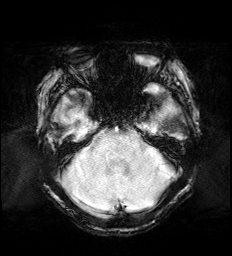
[im 40/60]
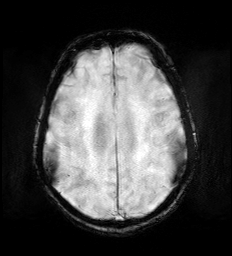
[im 60/60]
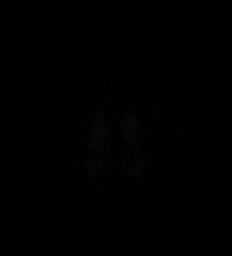

[Series 12: pha_images · axial · 3.0mm · 0.90mm/px · z∈[-106,+65]mm · 4 of 59 slices shown]
[im 1/59]
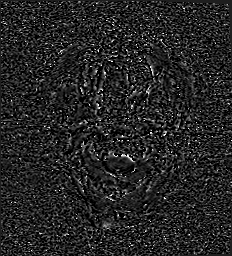
[im 20/59]
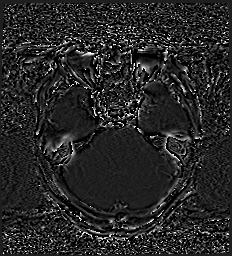
[im 39/59]
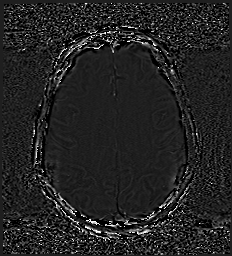
[im 59/59]
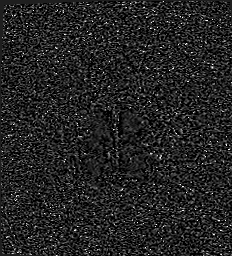

[Series 13: swi_images · axial · 3.0mm · 0.90mm/px · z∈[-109,+65]mm · 4 of 60 slices shown]
[im 1/60]
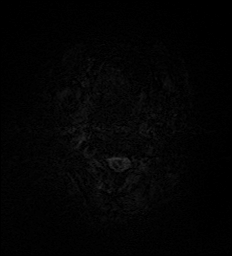
[im 20/60]
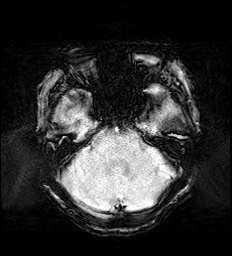
[im 40/60]
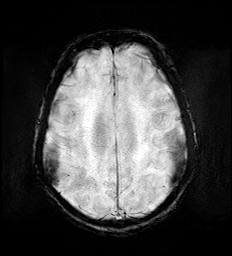
[im 60/60]
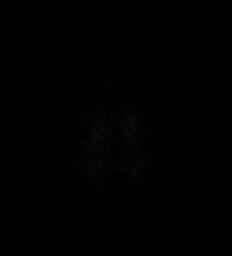

[Series 14: mip_images(sw) · axial · 24.0mm · 0.90mm/px · z∈[-99,+55]mm · 4 of 53 slices shown]
[im 1/53]
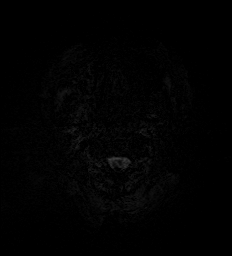
[im 18/53]
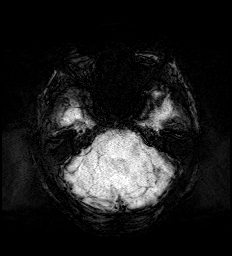
[im 35/53]
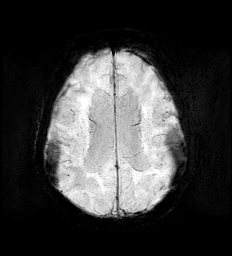
[im 53/53]
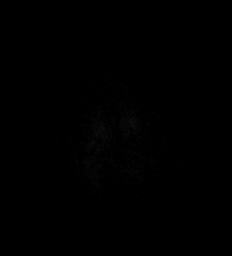

[Series 15: FLAIR · axial · 3.0mm · 0.53mm/px · z∈[-102,+57]mm · 4 of 55 slices shown]
[im 1/55]
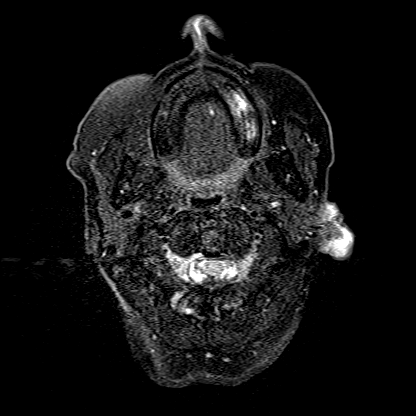
[im 19/55]
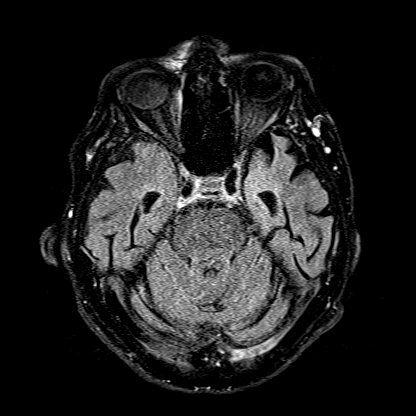
[im 37/55]
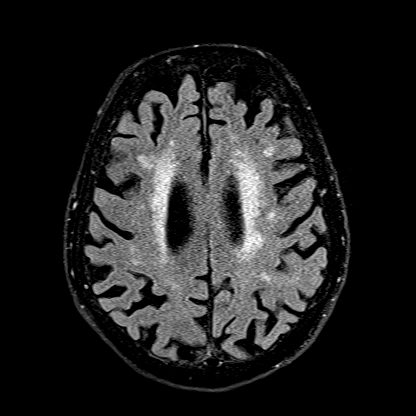
[im 55/55]
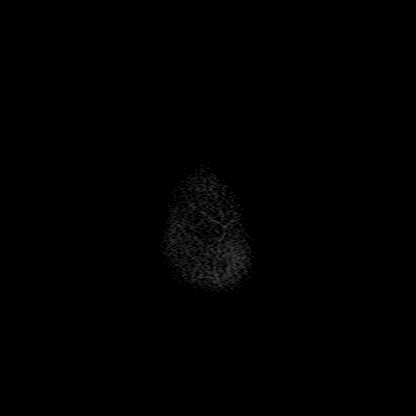

[Series 16: T1 · axial · 1.0mm · 0.98mm/px · z∈[-105,+67]mm · 11 of 175 slices shown (2 of 2)]
[im 1/175]
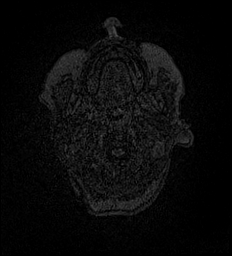
[im 16/175]
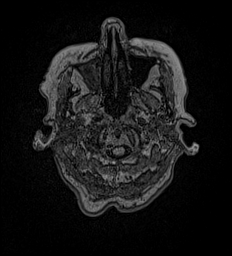
[im 32/175]
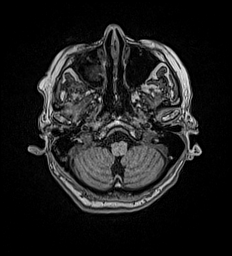
[im 48/175]
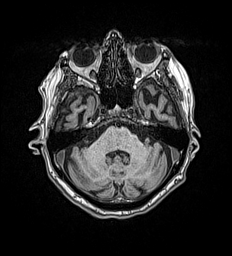
[im 64/175]
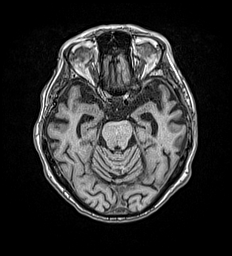
[im 80/175]
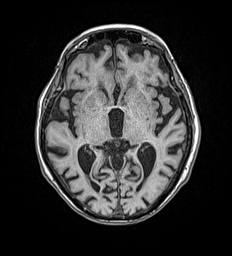
[im 95/175]
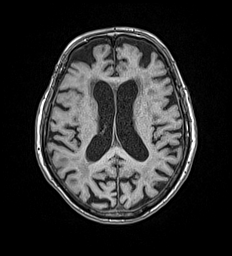
[im 111/175]
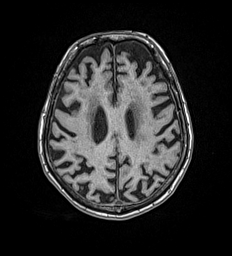
[im 127/175]
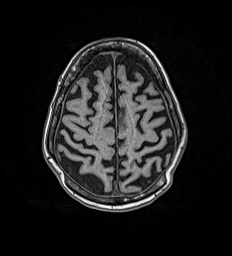
[im 143/175]
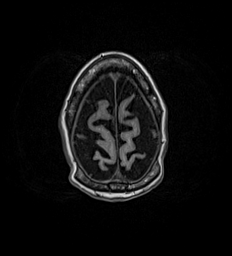
[im 175/175]
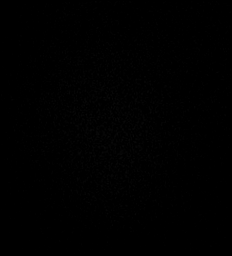

[Series 17: T2 · coronal · 5.0mm · 0.57mm/px · 2 of 29 slices shown (2 of 2)]
[im 1/29]
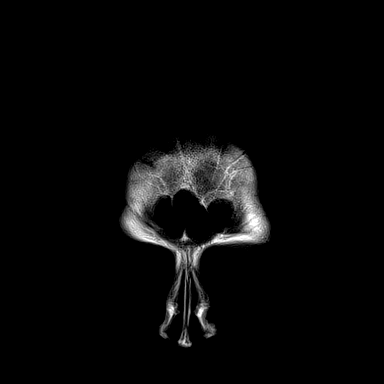
[im 29/29]
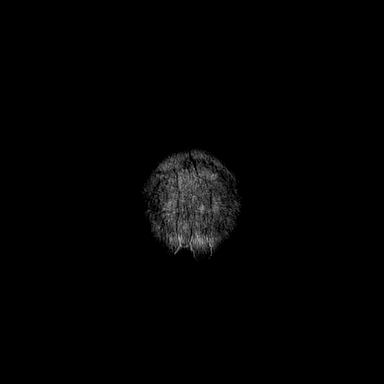

[47 of 48 positions shown; findings below may reference images not displayed]

FINDINGS: Brain:

Moderate generalized cerebral atrophy. Commensurate prominence of
the ventricles and sulci. Comparatively mild cerebellar atrophy.

Moderate multifocal T2/FLAIR hyperintensity within the cerebral
white matter, nonspecific but compatible with chronic small vessel
ischemic disease.

There is no acute infarct.

No evidence of an intracranial mass.

No chronic intracranial blood products.

No extra-axial fluid collection.

No midline shift.

Vascular: Expected proximal arterial flow voids.

Skull and upper cervical spine: No focal suspicious marrow lesion.
Redemonstrated nonspecific thinning of the bilateral parietal
calvarium. Ligamentous hypertrophy/pannus formation posterior to the
dens.

Sinuses/Orbits: Visualized orbits show no acute finding. Severe
mucosal thickening within the right maxillary sinus, progressed from
the brain MRI of 04/11/2020.
IMPRESSION: No evidence of acute intracranial abnormality.

Moderate chronic small vessel ischemic changes within the cerebral
white matter, stable as compared to the brain MRI of 04/11/2020.

Moderate generalized cerebral atrophy. Comparatively mild cerebellar
atrophy. These findings are also stable.

Severe mucosal thickening within the right maxillary sinus,
progressed.
